# Patient Record
Sex: Male | Born: 1940 | Race: White | Hispanic: No | Marital: Married | Smoking: Former smoker
Health system: Southern US, Community
[De-identification: ages and names within clinical notes are randomized; demographics above are authoritative.]

## PROBLEM LIST (undated history)

## (undated) DIAGNOSIS — N183 Chronic kidney disease, stage 3 unspecified: Secondary | ICD-10-CM

## (undated) DIAGNOSIS — I5020 Unspecified systolic (congestive) heart failure: Secondary | ICD-10-CM

## (undated) DIAGNOSIS — E039 Hypothyroidism, unspecified: Secondary | ICD-10-CM

## (undated) DIAGNOSIS — I350 Nonrheumatic aortic (valve) stenosis: Secondary | ICD-10-CM

## (undated) DIAGNOSIS — E119 Type 2 diabetes mellitus without complications: Secondary | ICD-10-CM

## (undated) HISTORY — PX: AORTIC VALVE REPLACEMENT: SHX41

## (undated) HISTORY — PX: CHOLECYSTECTOMY: SHX55

## (undated) SURGERY — Surgical Case
Anesthesia: *Unknown

---

## 2020-04-04 DIAGNOSIS — I2699 Other pulmonary embolism without acute cor pulmonale: Secondary | ICD-10-CM

## 2020-04-04 HISTORY — DX: Other pulmonary embolism without acute cor pulmonale: I26.99

## 2021-09-15 ENCOUNTER — Encounter (HOSPITAL_COMMUNITY): Payer: Self-pay | Admitting: Neurology

## 2021-09-15 ENCOUNTER — Inpatient Hospital Stay (HOSPITAL_COMMUNITY): Payer: Medicare HMO

## 2021-09-15 ENCOUNTER — Inpatient Hospital Stay (HOSPITAL_COMMUNITY): Payer: Medicare HMO | Admitting: Certified Registered Nurse Anesthetist

## 2021-09-15 ENCOUNTER — Emergency Department (HOSPITAL_COMMUNITY): Payer: Medicare HMO

## 2021-09-15 ENCOUNTER — Encounter (HOSPITAL_COMMUNITY)
Admission: EM | Disposition: A | Discharge status: Rehabilitation Facility | Payer: Self-pay | Source: Home / Self Care | Attending: Neurology

## 2021-09-15 ENCOUNTER — Inpatient Hospital Stay (HOSPITAL_COMMUNITY)
Admission: EM | Admit: 2021-09-15 | Discharge: 2021-09-22 | DRG: 023 | Disposition: A | Discharge status: Rehabilitation Facility | Payer: Medicare HMO | Attending: Neurology | Admitting: Neurology

## 2021-09-15 DIAGNOSIS — I639 Cerebral infarction, unspecified: Principal | ICD-10-CM

## 2021-09-15 DIAGNOSIS — N183 Chronic kidney disease, stage 3 unspecified: Secondary | ICD-10-CM | POA: Diagnosis not present

## 2021-09-15 DIAGNOSIS — K5900 Constipation, unspecified: Secondary | ICD-10-CM | POA: Diagnosis not present

## 2021-09-15 DIAGNOSIS — R1312 Dysphagia, oropharyngeal phase: Secondary | ICD-10-CM | POA: Diagnosis not present

## 2021-09-15 DIAGNOSIS — Z79890 Hormone replacement therapy (postmenopausal): Secondary | ICD-10-CM

## 2021-09-15 DIAGNOSIS — K5903 Drug induced constipation: Secondary | ICD-10-CM | POA: Diagnosis not present

## 2021-09-15 DIAGNOSIS — Z86718 Personal history of other venous thrombosis and embolism: Secondary | ICD-10-CM

## 2021-09-15 DIAGNOSIS — K567 Ileus, unspecified: Secondary | ICD-10-CM | POA: Diagnosis not present

## 2021-09-15 DIAGNOSIS — Z9911 Dependence on respirator [ventilator] status: Secondary | ICD-10-CM | POA: Diagnosis not present

## 2021-09-15 DIAGNOSIS — E43 Unspecified severe protein-calorie malnutrition: Secondary | ICD-10-CM | POA: Insufficient documentation

## 2021-09-15 DIAGNOSIS — Z20822 Contact with and (suspected) exposure to covid-19: Secondary | ICD-10-CM | POA: Diagnosis present

## 2021-09-15 DIAGNOSIS — E1122 Type 2 diabetes mellitus with diabetic chronic kidney disease: Secondary | ICD-10-CM | POA: Diagnosis present

## 2021-09-15 DIAGNOSIS — J9601 Acute respiratory failure with hypoxia: Secondary | ICD-10-CM | POA: Diagnosis not present

## 2021-09-15 DIAGNOSIS — Z86711 Personal history of pulmonary embolism: Secondary | ICD-10-CM

## 2021-09-15 DIAGNOSIS — E039 Hypothyroidism, unspecified: Secondary | ICD-10-CM | POA: Diagnosis present

## 2021-09-15 DIAGNOSIS — I63412 Cerebral infarction due to embolism of left middle cerebral artery: Principal | ICD-10-CM | POA: Diagnosis present

## 2021-09-15 DIAGNOSIS — N179 Acute kidney failure, unspecified: Secondary | ICD-10-CM | POA: Diagnosis present

## 2021-09-15 DIAGNOSIS — R001 Bradycardia, unspecified: Secondary | ICD-10-CM | POA: Diagnosis present

## 2021-09-15 DIAGNOSIS — I6523 Occlusion and stenosis of bilateral carotid arteries: Secondary | ICD-10-CM | POA: Diagnosis present

## 2021-09-15 DIAGNOSIS — A419 Sepsis, unspecified organism: Secondary | ICD-10-CM | POA: Diagnosis not present

## 2021-09-15 DIAGNOSIS — Z9049 Acquired absence of other specified parts of digestive tract: Secondary | ICD-10-CM

## 2021-09-15 DIAGNOSIS — J690 Pneumonitis due to inhalation of food and vomit: Secondary | ICD-10-CM | POA: Diagnosis not present

## 2021-09-15 DIAGNOSIS — E1165 Type 2 diabetes mellitus with hyperglycemia: Secondary | ICD-10-CM | POA: Diagnosis present

## 2021-09-15 DIAGNOSIS — G4700 Insomnia, unspecified: Secondary | ICD-10-CM | POA: Diagnosis not present

## 2021-09-15 DIAGNOSIS — R4701 Aphasia: Secondary | ICD-10-CM | POA: Diagnosis present

## 2021-09-15 DIAGNOSIS — L8990 Pressure ulcer of unspecified site, unspecified stage: Secondary | ICD-10-CM | POA: Insufficient documentation

## 2021-09-15 DIAGNOSIS — G8191 Hemiplegia, unspecified affecting right dominant side: Secondary | ICD-10-CM | POA: Diagnosis present

## 2021-09-15 DIAGNOSIS — I878 Other specified disorders of veins: Secondary | ICD-10-CM | POA: Diagnosis present

## 2021-09-15 DIAGNOSIS — Z953 Presence of xenogenic heart valve: Secondary | ICD-10-CM | POA: Diagnosis not present

## 2021-09-15 DIAGNOSIS — I5022 Chronic systolic (congestive) heart failure: Secondary | ICD-10-CM | POA: Diagnosis present

## 2021-09-15 DIAGNOSIS — R471 Dysarthria and anarthria: Secondary | ICD-10-CM | POA: Diagnosis present

## 2021-09-15 DIAGNOSIS — N1831 Chronic kidney disease, stage 3a: Secondary | ICD-10-CM | POA: Diagnosis present

## 2021-09-15 DIAGNOSIS — I428 Other cardiomyopathies: Secondary | ICD-10-CM | POA: Diagnosis present

## 2021-09-15 DIAGNOSIS — E785 Hyperlipidemia, unspecified: Secondary | ICD-10-CM | POA: Diagnosis present

## 2021-09-15 DIAGNOSIS — E11649 Type 2 diabetes mellitus with hypoglycemia without coma: Secondary | ICD-10-CM | POA: Diagnosis present

## 2021-09-15 DIAGNOSIS — I130 Hypertensive heart and chronic kidney disease with heart failure and stage 1 through stage 4 chronic kidney disease, or unspecified chronic kidney disease: Secondary | ICD-10-CM | POA: Diagnosis present

## 2021-09-15 DIAGNOSIS — R32 Unspecified urinary incontinence: Secondary | ICD-10-CM | POA: Diagnosis present

## 2021-09-15 DIAGNOSIS — Z79899 Other long term (current) drug therapy: Secondary | ICD-10-CM

## 2021-09-15 DIAGNOSIS — Z880 Allergy status to penicillin: Secondary | ICD-10-CM

## 2021-09-15 DIAGNOSIS — R29810 Facial weakness: Secondary | ICD-10-CM | POA: Diagnosis present

## 2021-09-15 DIAGNOSIS — J90 Pleural effusion, not elsewhere classified: Secondary | ICD-10-CM | POA: Diagnosis not present

## 2021-09-15 DIAGNOSIS — D696 Thrombocytopenia, unspecified: Secondary | ICD-10-CM | POA: Diagnosis not present

## 2021-09-15 DIAGNOSIS — I4891 Unspecified atrial fibrillation: Secondary | ICD-10-CM

## 2021-09-15 DIAGNOSIS — I6389 Other cerebral infarction: Secondary | ICD-10-CM | POA: Diagnosis not present

## 2021-09-15 DIAGNOSIS — I63512 Cerebral infarction due to unspecified occlusion or stenosis of left middle cerebral artery: Secondary | ICD-10-CM | POA: Diagnosis not present

## 2021-09-15 DIAGNOSIS — I454 Nonspecific intraventricular block: Secondary | ICD-10-CM | POA: Diagnosis present

## 2021-09-15 DIAGNOSIS — I4811 Longstanding persistent atrial fibrillation: Secondary | ICD-10-CM | POA: Diagnosis present

## 2021-09-15 DIAGNOSIS — Z7901 Long term (current) use of anticoagulants: Secondary | ICD-10-CM | POA: Diagnosis not present

## 2021-09-15 DIAGNOSIS — R54 Age-related physical debility: Secondary | ICD-10-CM | POA: Diagnosis present

## 2021-09-15 DIAGNOSIS — I255 Ischemic cardiomyopathy: Secondary | ICD-10-CM | POA: Diagnosis not present

## 2021-09-15 DIAGNOSIS — R29724 NIHSS score 24: Secondary | ICD-10-CM | POA: Diagnosis present

## 2021-09-15 DIAGNOSIS — I69351 Hemiplegia and hemiparesis following cerebral infarction affecting right dominant side: Secondary | ICD-10-CM | POA: Diagnosis not present

## 2021-09-15 DIAGNOSIS — Z87891 Personal history of nicotine dependence: Secondary | ICD-10-CM

## 2021-09-15 DIAGNOSIS — R1310 Dysphagia, unspecified: Secondary | ICD-10-CM | POA: Diagnosis present

## 2021-09-15 DIAGNOSIS — I480 Paroxysmal atrial fibrillation: Secondary | ICD-10-CM

## 2021-09-15 DIAGNOSIS — Z6821 Body mass index (BMI) 21.0-21.9, adult: Secondary | ICD-10-CM

## 2021-09-15 DIAGNOSIS — R4702 Dysphasia: Secondary | ICD-10-CM | POA: Diagnosis present

## 2021-09-15 DIAGNOSIS — I447 Left bundle-branch block, unspecified: Secondary | ICD-10-CM | POA: Diagnosis present

## 2021-09-15 DIAGNOSIS — R6521 Severe sepsis with septic shock: Secondary | ICD-10-CM | POA: Diagnosis not present

## 2021-09-15 DIAGNOSIS — I672 Cerebral atherosclerosis: Secondary | ICD-10-CM | POA: Diagnosis present

## 2021-09-15 DIAGNOSIS — Z23 Encounter for immunization: Secondary | ICD-10-CM | POA: Diagnosis present

## 2021-09-15 HISTORY — DX: Chronic kidney disease, stage 3 unspecified: N18.30

## 2021-09-15 HISTORY — PX: IR CT HEAD LTD: IMG2386

## 2021-09-15 HISTORY — DX: Nonrheumatic aortic (valve) stenosis: I35.0

## 2021-09-15 HISTORY — PX: IR PERCUTANEOUS ART THROMBECTOMY/INFUSION INTRACRANIAL INC DIAG ANGIO: IMG6087

## 2021-09-15 HISTORY — DX: Type 2 diabetes mellitus without complications: E11.9

## 2021-09-15 HISTORY — DX: Hypothyroidism, unspecified: E03.9

## 2021-09-15 HISTORY — PX: IR US GUIDE VASC ACCESS RIGHT: IMG2390

## 2021-09-15 HISTORY — DX: Unspecified systolic (congestive) heart failure: I50.20

## 2021-09-15 HISTORY — PX: RADIOLOGY WITH ANESTHESIA: SHX6223

## 2021-09-15 LAB — I-STAT CHEM 8, ED
BUN: 18 mg/dL (ref 8–23)
Calcium, Ion: 0.89 mmol/L — CL (ref 1.15–1.40)
Chloride: 99 mmol/L (ref 98–111)
Creatinine, Ser: 1.6 mg/dL — ABNORMAL HIGH (ref 0.61–1.24)
Glucose, Bld: 104 mg/dL — ABNORMAL HIGH (ref 70–99)
HCT: 43 % (ref 39.0–52.0)
Hemoglobin: 14.6 g/dL (ref 13.0–17.0)
Potassium: 4.6 mmol/L (ref 3.5–5.1)
Sodium: 136 mmol/L (ref 135–145)
TCO2: 27 mmol/L (ref 22–32)

## 2021-09-15 LAB — POCT I-STAT 7, (LYTES, BLD GAS, ICA,H+H)
Acid-Base Excess: 5 mmol/L — ABNORMAL HIGH (ref 0.0–2.0)
Bicarbonate: 28.1 mmol/L — ABNORMAL HIGH (ref 20.0–28.0)
Calcium, Ion: 1.07 mmol/L — ABNORMAL LOW (ref 1.15–1.40)
HCT: 32 % — ABNORMAL LOW (ref 39.0–52.0)
Hemoglobin: 10.9 g/dL — ABNORMAL LOW (ref 13.0–17.0)
O2 Saturation: 100 %
Patient temperature: 97.7
Potassium: 4.1 mmol/L (ref 3.5–5.1)
Sodium: 134 mmol/L — ABNORMAL LOW (ref 135–145)
TCO2: 29 mmol/L (ref 22–32)
pCO2 arterial: 35.6 mmHg (ref 32–48)
pH, Arterial: 7.503 — ABNORMAL HIGH (ref 7.35–7.45)
pO2, Arterial: 293 mmHg — ABNORMAL HIGH (ref 83–108)

## 2021-09-15 LAB — CBC
HCT: 43.2 % (ref 39.0–52.0)
Hemoglobin: 14.5 g/dL (ref 13.0–17.0)
MCH: 33.1 pg (ref 26.0–34.0)
MCHC: 33.6 g/dL (ref 30.0–36.0)
MCV: 98.6 fL (ref 80.0–100.0)
Platelets: 109 10*3/uL — ABNORMAL LOW (ref 150–400)
RBC: 4.38 MIL/uL (ref 4.22–5.81)
RDW: 13.2 % (ref 11.5–15.5)
WBC: 7.8 10*3/uL (ref 4.0–10.5)
nRBC: 0 % (ref 0.0–0.2)

## 2021-09-15 LAB — HEMOGLOBIN A1C
Hgb A1c MFr Bld: 5.9 % — ABNORMAL HIGH (ref 4.8–5.6)
Mean Plasma Glucose: 122.63 mg/dL

## 2021-09-15 LAB — PROTIME-INR
INR: 1.1 (ref 0.8–1.2)
Prothrombin Time: 14.2 seconds (ref 11.4–15.2)

## 2021-09-15 LAB — COMPREHENSIVE METABOLIC PANEL
ALT: 28 U/L (ref 0–44)
AST: 34 U/L (ref 15–41)
Albumin: 3.2 g/dL — ABNORMAL LOW (ref 3.5–5.0)
Alkaline Phosphatase: 94 U/L (ref 38–126)
Anion gap: 9 (ref 5–15)
BUN: 15 mg/dL (ref 8–23)
CO2: 27 mmol/L (ref 22–32)
Calcium: 8.6 mg/dL — ABNORMAL LOW (ref 8.9–10.3)
Chloride: 101 mmol/L (ref 98–111)
Creatinine, Ser: 1.54 mg/dL — ABNORMAL HIGH (ref 0.61–1.24)
GFR, Estimated: 45 mL/min — ABNORMAL LOW (ref >60)
Glucose, Bld: 113 mg/dL — ABNORMAL HIGH (ref 70–99)
Potassium: 4.8 mmol/L (ref 3.5–5.1)
Sodium: 137 mmol/L (ref 135–145)
Total Bilirubin: 0.8 mg/dL (ref 0.3–1.2)
Total Protein: 5.9 g/dL — ABNORMAL LOW (ref 6.5–8.1)

## 2021-09-15 LAB — GLUCOSE, CAPILLARY
Glucose-Capillary: 147 mg/dL — ABNORMAL HIGH (ref 70–99)
Glucose-Capillary: 178 mg/dL — ABNORMAL HIGH (ref 70–99)
Glucose-Capillary: 228 mg/dL — ABNORMAL HIGH (ref 70–99)

## 2021-09-15 LAB — DIFFERENTIAL
Abs Immature Granulocytes: 0.01 10*3/uL (ref 0.00–0.07)
Basophils Absolute: 0 10*3/uL (ref 0.0–0.1)
Basophils Relative: 1 %
Eosinophils Absolute: 0.1 10*3/uL (ref 0.0–0.5)
Eosinophils Relative: 2 %
Immature Granulocytes: 0 %
Lymphocytes Relative: 34 %
Lymphs Abs: 2.6 10*3/uL (ref 0.7–4.0)
Monocytes Absolute: 0.8 10*3/uL (ref 0.1–1.0)
Monocytes Relative: 10 %
Neutro Abs: 4.2 10*3/uL (ref 1.7–7.7)
Neutrophils Relative %: 53 %

## 2021-09-15 LAB — PHOSPHORUS: Phosphorus: 2.2 mg/dL — ABNORMAL LOW (ref 2.5–4.6)

## 2021-09-15 LAB — TYPE AND SCREEN
ABO/RH(D): O POS
Antibody Screen: NEGATIVE

## 2021-09-15 LAB — MAGNESIUM: Magnesium: 1.9 mg/dL (ref 1.7–2.4)

## 2021-09-15 LAB — CBG MONITORING, ED: Glucose-Capillary: 122 mg/dL — ABNORMAL HIGH (ref 70–99)

## 2021-09-15 LAB — SARS CORONAVIRUS 2 BY RT PCR: SARS Coronavirus 2 by RT PCR: NEGATIVE

## 2021-09-15 LAB — ETHANOL: Alcohol, Ethyl (B): <10 mg/dL (ref <10)

## 2021-09-15 LAB — ABO/RH: ABO/RH(D): O POS

## 2021-09-15 LAB — APTT: aPTT: 31 seconds (ref 24–36)

## 2021-09-15 LAB — TROPONIN I (HIGH SENSITIVITY): Troponin I (High Sensitivity): 33 ng/L — ABNORMAL HIGH (ref <18)

## 2021-09-15 SURGERY — IR WITH ANESTHESIA
Anesthesia: General

## 2021-09-15 MED ORDER — PROPOFOL 500 MG/50ML IV EMUL: INTRAVENOUS | Status: DC | PRN

## 2021-09-15 MED ORDER — FENTANYL CITRATE PF 50 MCG/ML IJ SOSY: 50.0000 ug | PREFILLED_SYRINGE | INTRAMUSCULAR | Status: DC | PRN

## 2021-09-15 MED ORDER — CHLORHEXIDINE GLUCONATE CLOTH 2 % EX PADS
6.0000 | MEDICATED_PAD | Freq: Every day | CUTANEOUS | Status: DC
Start: 2021-09-16 — End: 2021-09-17
  Administered 2021-09-16: 6 via TOPICAL

## 2021-09-15 MED ORDER — CALCIUM GLUCONATE-NACL 1-0.675 GM/50ML-% IV SOLN
1.0000 g | Freq: Once | INTRAVENOUS | Status: AC
  Administered 2021-09-15: 1000 mg via INTRAVENOUS
  Filled 2021-09-15: qty 50

## 2021-09-15 MED ORDER — SODIUM CHLORIDE 0.9 % IV SOLN: INTRAVENOUS | Status: DC | PRN

## 2021-09-15 MED ORDER — INSULIN ASPART 100 UNIT/ML IJ SOLN
1.0000 [IU] | INTRAMUSCULAR | Status: DC
  Administered 2021-09-15: 3 [IU] via SUBCUTANEOUS
  Administered 2021-09-15: 2 [IU] via SUBCUTANEOUS
  Administered 2021-09-16: 3 [IU] via SUBCUTANEOUS
  Administered 2021-09-16: 2 [IU] via SUBCUTANEOUS

## 2021-09-15 MED ORDER — PANTOPRAZOLE SODIUM 40 MG IV SOLR
40.0000 mg | Freq: Every day | INTRAVENOUS | Status: DC
  Administered 2021-09-15 – 2021-09-17 (×3): 40 mg via INTRAVENOUS
  Filled 2021-09-15 (×3): qty 10

## 2021-09-15 MED ORDER — ONDANSETRON HCL 4 MG/2ML IJ SOLN
INTRAMUSCULAR | Status: DC | PRN
  Administered 2021-09-15: 4 mg via INTRAVENOUS

## 2021-09-15 MED ORDER — LIDOCAINE 2% (20 MG/ML) 5 ML SYRINGE
INTRAMUSCULAR | Status: DC | PRN
  Administered 2021-09-15: 40 mg via INTRAVENOUS

## 2021-09-15 MED ORDER — ORAL CARE MOUTH RINSE
15.0000 mL | OROMUCOSAL | Status: DC
  Administered 2021-09-15 – 2021-09-18 (×34): 15 mL via OROMUCOSAL

## 2021-09-15 MED ORDER — PROPOFOL 10 MG/ML IV BOLUS
INTRAVENOUS | Status: DC | PRN
  Administered 2021-09-15: 75 ug/kg/min via INTRAVENOUS
  Administered 2021-09-15: 80 mg via INTRAVENOUS

## 2021-09-15 MED ORDER — EPTIFIBATIDE 20 MG/10ML IV SOLN
INTRAVENOUS | Status: AC
  Filled 2021-09-15: qty 10

## 2021-09-15 MED ORDER — STROKE: EARLY STAGES OF RECOVERY BOOK
Freq: Once | Status: AC
  Filled 2021-09-15: qty 1

## 2021-09-15 MED ORDER — SUCCINYLCHOLINE CHLORIDE 200 MG/10ML IV SOSY
PREFILLED_SYRINGE | INTRAVENOUS | Status: DC | PRN
  Administered 2021-09-15: 140 mg via INTRAVENOUS

## 2021-09-15 MED ORDER — POLYETHYLENE GLYCOL 3350 17 G PO PACK
17.0000 g | PACK | Freq: Every day | ORAL | Status: DC
  Filled 2021-09-15: qty 1

## 2021-09-15 MED ORDER — DOCUSATE SODIUM 50 MG/5ML PO LIQD
100.0000 mg | Freq: Two times a day (BID) | ORAL | Status: DC
  Administered 2021-09-15: 100 mg
  Filled 2021-09-15 (×2): qty 10

## 2021-09-15 MED ORDER — FENTANYL CITRATE (PF) 100 MCG/2ML IJ SOLN
INTRAMUSCULAR | Status: DC | PRN
  Administered 2021-09-15: 100 ug via INTRAVENOUS

## 2021-09-15 MED ORDER — FENTANYL CITRATE (PF) 100 MCG/2ML IJ SOLN
INTRAMUSCULAR | Status: AC
  Filled 2021-09-15: qty 2

## 2021-09-15 MED ORDER — ROCURONIUM BROMIDE 10 MG/ML (PF) SYRINGE
PREFILLED_SYRINGE | INTRAVENOUS | Status: DC | PRN
  Administered 2021-09-15: 40 mg via INTRAVENOUS

## 2021-09-15 MED ORDER — ASPIRIN 81 MG PO CHEW: 81.0000 mg | CHEWABLE_TABLET | Freq: Every day | ORAL | Status: DC

## 2021-09-15 MED ORDER — SENNOSIDES-DOCUSATE SODIUM 8.6-50 MG PO TABS
1.0000 | ORAL_TABLET | Freq: Two times a day (BID) | ORAL | Status: DC
  Administered 2021-09-15: 1 via ORAL
  Filled 2021-09-15 (×2): qty 1

## 2021-09-15 MED ORDER — FENTANYL CITRATE (PF) 100 MCG/2ML IJ SOLN
50.0000 ug | INTRAMUSCULAR | Status: DC | PRN
  Administered 2021-09-15 – 2021-09-16 (×2): 100 ug via INTRAVENOUS
  Filled 2021-09-15 (×2): qty 2

## 2021-09-15 MED ORDER — SODIUM CHLORIDE 0.9% FLUSH
3.0000 mL | Freq: Once | INTRAVENOUS | Status: AC
  Administered 2021-09-19: 3 mL via INTRAVENOUS

## 2021-09-15 MED ORDER — SODIUM CHLORIDE 0.9 % IV SOLN: INTRAVENOUS | Status: DC

## 2021-09-15 MED ORDER — ASPIRIN 81 MG PO CHEW
CHEWABLE_TABLET | ORAL | Status: AC
  Filled 2021-09-15: qty 1

## 2021-09-15 MED ORDER — ATORVASTATIN CALCIUM 40 MG PO TABS
40.0000 mg | ORAL_TABLET | Freq: Every day | ORAL | Status: DC
  Administered 2021-09-17 – 2021-09-18 (×2): 40 mg
  Filled 2021-09-15 (×2): qty 1

## 2021-09-15 MED ORDER — TICAGRELOR 90 MG PO TABS
ORAL_TABLET | ORAL | Status: AC
  Filled 2021-09-15: qty 2

## 2021-09-15 MED ORDER — PROPOFOL 1000 MG/100ML IV EMUL
5.0000 ug/kg/min | INTRAVENOUS | Status: DC
  Administered 2021-09-16: 30 ug/kg/min via INTRAVENOUS
  Administered 2021-09-16 – 2021-09-17 (×2): 20 ug/kg/min via INTRAVENOUS
  Filled 2021-09-15 (×5): qty 100

## 2021-09-15 MED ORDER — IOHEXOL 300 MG/ML  SOLN
100.0000 mL | Freq: Once | INTRAMUSCULAR | Status: AC | PRN
  Administered 2021-09-15: 25 mL via INTRA_ARTERIAL

## 2021-09-15 MED ORDER — GLYCOPYRROLATE PF 0.2 MG/ML IJ SOSY
PREFILLED_SYRINGE | INTRAMUSCULAR | Status: DC | PRN
  Administered 2021-09-15: .2 mg via INTRAVENOUS

## 2021-09-15 MED ORDER — FENTANYL CITRATE (PF) 100 MCG/2ML IJ SOLN: 50.0000 ug | INTRAMUSCULAR | Status: DC | PRN

## 2021-09-15 MED ORDER — IOHEXOL 350 MG/ML SOLN
75.0000 mL | Freq: Once | INTRAVENOUS | Status: AC | PRN
Start: 2021-09-15 — End: 2021-09-15
  Administered 2021-09-15: 75 mL via INTRAVENOUS

## 2021-09-15 MED ORDER — CLEVIDIPINE BUTYRATE 0.5 MG/ML IV EMUL: 0.0000 mg/h | INTRAVENOUS | Status: DC

## 2021-09-15 MED ORDER — ACETAMINOPHEN 325 MG PO TABS: 650.0000 mg | ORAL_TABLET | ORAL | Status: DC | PRN

## 2021-09-15 MED ORDER — CANGRELOR TETRASODIUM 50 MG IV SOLR
INTRAVENOUS | Status: AC
  Filled 2021-09-15: qty 50

## 2021-09-15 MED ORDER — EPHEDRINE SULFATE-NACL 50-0.9 MG/10ML-% IV SOSY
PREFILLED_SYRINGE | INTRAVENOUS | Status: DC | PRN
  Administered 2021-09-15 (×4): 5 mg via INTRAVENOUS

## 2021-09-15 MED ORDER — CLEVIDIPINE BUTYRATE 0.5 MG/ML IV EMUL
INTRAVENOUS | Status: AC
  Administered 2021-09-15: 8 mg/h via INTRAVENOUS
  Filled 2021-09-15: qty 50

## 2021-09-15 MED ORDER — CLOPIDOGREL BISULFATE 300 MG PO TABS
ORAL_TABLET | ORAL | Status: AC
  Filled 2021-09-15: qty 1

## 2021-09-15 MED ORDER — DEXAMETHASONE SODIUM PHOSPHATE 10 MG/ML IJ SOLN
INTRAMUSCULAR | Status: DC | PRN
  Administered 2021-09-15: 5 mg via INTRAVENOUS

## 2021-09-15 MED ORDER — ORAL CARE MOUTH RINSE: 15.0000 mL | OROMUCOSAL | Status: DC | PRN

## 2021-09-15 MED ORDER — NITROGLYCERIN 1 MG/10 ML FOR IR/CATH LAB
INTRA_ARTERIAL | Status: AC
  Filled 2021-09-15: qty 10

## 2021-09-15 MED ORDER — LACTATED RINGERS IV SOLN: INTRAVENOUS | Status: DC | PRN

## 2021-09-15 MED ORDER — VERAPAMIL HCL 2.5 MG/ML IV SOLN
INTRAVENOUS | Status: AC
  Filled 2021-09-15: qty 2

## 2021-09-15 MED ORDER — TIROFIBAN HCL IN NACL 5-0.9 MG/100ML-% IV SOLN
INTRAVENOUS | Status: AC
  Filled 2021-09-15: qty 100

## 2021-09-15 MED ORDER — CLEVIDIPINE BUTYRATE 0.5 MG/ML IV EMUL
0.0000 mg/h | INTRAVENOUS | Status: DC
  Administered 2021-09-15: 3 mg/h via INTRAVENOUS
  Administered 2021-09-16: 4 mg/h via INTRAVENOUS
  Filled 2021-09-15 (×3): qty 100

## 2021-09-15 MED ORDER — ACETAMINOPHEN 650 MG RE SUPP: 650.0000 mg | RECTAL | Status: DC | PRN

## 2021-09-15 MED ORDER — PROPOFOL 500 MG/50ML IV EMUL
INTRAVENOUS | Status: DC | PRN
  Administered 2021-09-15: 75 ug/kg/min via INTRAVENOUS

## 2021-09-15 MED ORDER — ACETAMINOPHEN 160 MG/5ML PO SOLN: 650.0000 mg | ORAL | Status: DC | PRN

## 2021-09-15 NOTE — Anesthesia Procedure Notes (Signed)
Arterial Line Insertion Start/End6/14/2023 4:00 PM, 09/15/2021 4:09 PM Performed by: Imagene Riches, CRNA, CRNA  Patient location: OR. Preanesthetic checklist: patient identified, IV checked, site marked, risks and benefits discussed, surgical consent, monitors and equipment checked, pre-op evaluation, timeout performed and anesthesia consent Left, radial was placed Catheter size: 20 G Hand hygiene performed  and maximum sterile barriers used  Allen's test indicative of satisfactory collateral circulation Attempts: 1 Procedure performed without using ultrasound guided technique. Following insertion, dressing applied and Biopatch. Post procedure assessment: normal  Patient tolerated the procedure well with no immediate complications.

## 2021-09-15 NOTE — Consult Note (Addendum)
NAME:  Eugene Campbell, MRN:  [id number], DOB:  [birth year], LOS: 0 ADMISSION DATE:  09/15/2021, CONSULTATION DATE:  09/15/21 REFERRING MD:  Theda Sers CHIEF COMPLAINT:  Vent management   History of Present Illness:  Eugene Campbell is a 81 y.o. male who per [place] has PMH including but not limited to A.fib on Eliquis and Amiodarone, HTN, PE and DVT, clotting disorder (unknown what exactly), DM2 without long term insulin use, Hypothyroidism. He presented to [facility] 6/14 with stroke like symptoms (dysphasia, right hemiplegia) that began suddenly that afternoon after he had been working outside prior. LKN 1430.  CTH demonstrated hyperdense L M1 MCA segment with suspected subtle acute infarct involving left insula. CTA head demonstrated occluded M1 segment of L MCA.  He was taken to IR where he had cerebral angiogram with mechanical thrombectomy with complete recanalization of the L MCA (TICI 3).  Post procedure, he remained ventilated and PCCM was called for assistance with vent management.   Pertinent  Medical History:  has Stroke [facility]) on their problem list.  Significant Hospital Events: Including procedures, antibiotic start and stop dates in addition to other pertinent events   6/14 admit.  Interim History / Subjective:  HR 40's - 50's, irregular, A.fib. Sedated on Propofol.  Objective:  Blood pressure (!) 182/124, pulse (!) 56, resp. rate (!) 22, weight 75.2 kg, SpO2 99 %.        Intake/Output Summary (Last 24 hours) at 09/15/2021 1718 Last data filed at 09/15/2021 1658 Gross per 24 hour  Intake 1200 ml  Output 575 ml  Net 625 ml   Filed Weights   09/15/21 1527  Weight: 75.2 kg    Examination: General: Elderly male, resting in bed, in NAD. Neuro: Sedated, not responsive. HEENT: [place]/AT. Sclerae anicteric. ETT in place. Cardiovascular: IRIR, brady, soft SEM.  Lungs: Respirations even and unlabored.  CTA bilaterally, No W/R/R. Abdomen: BS x 4, soft, NT/ND.   Musculoskeletal: No gross deformities, no edema.  Skin: Intact, warm, no rashes.  Labs/imaging personally reviewed:  [facility] 6/14 > hyperdense L M1 MCA segment with suspected subtle acute infarct involving left insula.  CTA head 6/14 > occluded M1 segment of L MCA.CTH demonstrated hyperdense L M1 MCA segment with suspected subtle acute infarct involving left insula. CTA head demonstrated occluded M1 segment of L MCA. 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 - Post procedure management per IR. - Stroke workup / management per neuro. - F/u on MRI brain, echo.  Hypertension. - Cleviprex PRN for goal SBP 120 - 140 per neuro. - Needs pharmacy assistance for med rec as care everywhere shows discrepancies in meds versus some of cardiology notes etc.  Respiratory insufficiency - due to inability to protect the airway in the setting of above. - Full vent support. - Assess ABG. - Wean as able. - Daily SBT if mental status allows. - Bronchial hygiene. - Follow CXR.  AKI. Hypocalcemia. - Supportive care with gentle fluids. - 1g Ca gluconate. - Follow BMP.  Bradycardia - ? 2/2 high dose propofol, now lowered and RN to adjust accordingly. Hx A.fib on Eliquis and Amiodarone, HTN, sCHF and NICM (Echo from Oct 2022 with EF 20-25% - Monitor closely. - Hold home Amiodarone. - Defer to neuro regarding timing of resuming AC, will need heparin gtt for now  then transition back to home Eliquis eventually.  Hx PE and DVT (Oct 2022). - Supportive care.  Hx Hypothyroidism. - Check TSH. - Continue home Synthroid (75 daily).    Best practice (evaluated daily):  Per primary team.  Labs   CBC: Recent Labs  Lab 09/15/21 1525 09/15/21 1532  WBC 7.8  --   NEUTROABS 4.2  --   HGB 14.5 14.6  HCT 43.2 43.0  MCV 98.6  --   PLT 109*  --      Basic Metabolic Panel: Recent Labs  Lab 09/15/21 1525 09/15/21 1532  NA 137 136  K 4.8 4.6  CL 101 99  CO2 27  --   GLUCOSE 113* 104*  BUN 15 18  CREATININE 1.54* 1.60*  CALCIUM 8.6*  --    GFR: CrCl cannot be calculated (Unknown ideal weight.). Recent Labs  Lab 09/15/21 1525  WBC 7.8    Liver Function Tests: Recent Labs  Lab 09/15/21 1525  AST 34  ALT 28  ALKPHOS 94  BILITOT 0.8  PROT 5.9*  ALBUMIN 3.2*   No results for input(s): "LIPASE", "AMYLASE" in the last 168 hours. No results for input(s): "AMMONIA" in the last 168 hours.  ABG    Component Value Date/Time   TCO2 27 09/15/2021 1532     Coagulation Profile: Recent Labs  Lab 09/15/21 1525  INR 1.1    Cardiac Enzymes: No results for input(s): "CKTOTAL", "CKMB", "CKMBINDEX", "TROPONINI" in the last 168 hours.  HbA1C: No results found for: "HGBA1C"  CBG: Recent Labs  Lab 09/15/21 1524  GLUCAP 122*    Review of Systems:   Unable to obtain as pt is encephalopathic.  Past Medical History:  He,  has no past medical history on file.   Surgical History:  Unavailable.  Social History:      Family History:  His family history is not on file.   Allergies Not on File   Home Medications  Prior to Admission medications   Not on File     Critical care time: 45 min.   Montey Hora, [facility] For pager details, please see AMION or use Epic chat  After 1900, please call [facility] for cross coverage needs 09/15/2021, 5:18 PM

## 2021-09-15 NOTE — H&P (Signed)
Neurology Consult H&P  Eugene Campbell MR# [id number] 09/15/2021  CC: code stroke  History is obtained from: EMS and chart.  HPI: Eugene Campbell is a 81 y.o. male PMHx as reviewed below was outside working, came inside and sat down when his wife noticed that he was not speaking or responding normally to her, so she called EMS.    EMS the activated code stroke due to right arm and leg weakness, right sided facial droop, and aphasia.   LKW: 1430 tNK given: No not a candidate due to apixaban IR Thrombectomy yes Modified Rankin Scale: 1-No significant post stroke disability and can perform usual duties with stroke symptoms NIHSS: 24  ROS: A complete ROS was performed and is negative except as noted in the HPI.   Past Medical History:  Diagnosis Date   Aortic stenosis    Chronic renal impairment, stage 3 (moderate), unspecified whether stage 3a or 3b CKD ([name])    Diabetes ([name])    HFrEF (heart failure with reduced ejection fraction) ([place])    Hypothyroidism    PE (pulmonary thromboembolism) ([place]) 2022    History reviewed. No pertinent family history.  Social History:  reports that he quit smoking about 50 years ago. His smoking use included cigarettes. He has a 13.00 pack-year smoking history. He does not have any smokeless tobacco history on file. He reports that he does not drink alcohol. No history on file for drug use.   Prior to Admission medications   Not on File    Exam: Current vital signs: BP (!) 146/75   Pulse (!) 44   Resp 16   Ht 6\' 1"  (1.854 m)   Wt 75.2 kg   SpO2 100%   BMI 21.87 kg/m   Physical Exam  Constitutional: Appears well-developed and well-nourished.  Psych: Unable to assess due to mutism Eyes: No scleral injection HENT: No OP obstruction. Head: Normocephalic.  Cardiovascular: Normal rate and regular rhythm.  Respiratory: Effort normal, symmetric excursions bilaterally, no audible wheezing. GI: Soft.  No distension. There is no tenderness.   Skin: WDI  Neuro: Mental Status: Patient is awake and mute Speech mute No signs of neglect. Right hemianopia. Pupils are equal, round, and reactive to light. Left gaze without ptosis or diplopia.  Facial sensation is symmetric to temperature Facial movement is symmetric.  Hearing is intact to voice. Uvula midline and palate elevates symmetrically. Shoulder shrug is symmetric. Tongue is midline without atrophy or fasciculations.  Tone is normal. Bulk is normal. right facial droop, right arm weakness, right leg weakness. Sensation is symmetric to light touch and temperature in the arms and legs. Deep Tendon Reflexes: 2+ and symmetric in the biceps and patellae. Toes are downgoing bilaterally. FNF and HKS are intact bilaterally. Gait - Deferred  I have reviewed labs in epic and the pertinent results are: CBG 122  I have reviewed the images obtained: NCT head showed subtle acute infarct involving the left insula with hyperdense M1 left MCA segment, highly suspicious for presence of endoluminal thrombus. ASPECTS 9. CTA head and neck showed M1 segment of the left middle cerebral artery is occluded at its origin. There is some reconstitution of enhancement within M2 and more distal left MCA vessels, likely due to collateral flow.  Assessment: Eugene Campbell is a 81 y.o. male PMHx as noted above with left M1 occlusion. The patient was not a candidate for tNK due to being on apixaban however, discussed with NeuroIR and he is a candidate for intervention and was  taken for procedure.  Plan: Admit to NICU for post thrombectomy management per protocol. Close surveillance of neurological status. Hip straight x 6 hrs Monitor groin puncture site for pulse quality, limb temperature, signs of neurovascular compromise.  Antiplatelets: Per neuro IR  Blood pressure: MAP >65 SBP 120-140: - Start clevidipine infusion may increase to maximum 32mcg/min as needed to maintain SBP<120 -140. -  Labetalol 20mg  every 10 minutes as needed if SBP>140.   Labs: type and cross, CBC, CMP, Mg, phos, troponin, HbA1c, lipid panel, TSH.  Maintain O2 sats > 94% Normothermia - For temperature >37.5C - acetaminophen 650mg  q4-6 hours PRN. Gentle IV hydration. CT head 6 hours post procedure or per protocol.  MRI brain without contrast when able. Relative euglycemia and treat if hyperglycemia (>200 mg/dL)/hypoglycemia (< 60mg /dL). TTE. Statin if LDL > 70 Telemetry monitoring for arrhythmia. Recommend Stroke education. Recommend PT/OT/SLP consult. If there is acute neurologic decline STAT CT head.   PPx:     GI - H2, docusate 400mg  qhs.     DVT - heparin subq and SCDs. Precautions: Aspiration/seizure/fall   This patient is critically ill and at significant risk of neurological worsening, death and care requires constant monitoring of vital signs, hemodynamics,respiratory and cardiac monitoring, neurological assessment, discussion with family, other specialists and medical decision making of high complexity. I spent 97 minutes of neurocritical care time  in the care of  this patient. This was time spent independent of any time provided by nurse practitioner or PA.  Electronically signed by:  Lynnae Sandhoff, MD Page: [phone] 09/15/2021, 6:20 PM  If 7pm- 7am, please page neurology on call as listed in [place].

## 2021-09-15 NOTE — Anesthesia Postprocedure Evaluation (Signed)
Anesthesia Post Note  Patient: Eugene Campbell  Procedure(s) Performed: IR WITH ANESTHESIA     Patient location during evaluation: SICU Anesthesia Type: General Level of consciousness: sedated and patient remains intubated per anesthesia plan Pain management: pain level controlled Vital Signs Assessment: post-procedure vital signs reviewed and stable Respiratory status: patient remains intubated per anesthesia plan and patient on ventilator - see flowsheet for VS Cardiovascular status: blood pressure returned to baseline and stable Postop Assessment: no apparent nausea or vomiting Anesthetic complications: no Comments: Pt remains intubated due to impaired mental status pre-operatively. 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  Last Vitals:  Vitals:   09/15/21 1745 09/15/21 1800  BP:  (!) 146/75  Pulse: (!) 45 (!) 44  Resp: 16 16  SpO2: 100% 100%    Last Pain: There were no vitals filed for this visit.               Midge Minium

## 2021-09-15 NOTE — ED Triage Notes (Signed)
Pt BIB [facility]  from home for eval of stroke like sx. Pt was reportedly working outside, came inside and per wife was normal. Pt then sat down in chair and wife noted that he was gazing off, not speaking and not responding normally to her. LKN per EMS is 1430 which is shortly after pt came inside. Per EMS, pt was initially NORMAL upon returning back into home. Pt arrives to ED w/ R sided gaze preference R arm flaccidity, R leg weakness, global aphasia, R sided facial droop.

## 2021-09-15 NOTE — Progress Notes (Signed)
Patient arrived on the unit on Propofol and Cleviprex. NIHSS, VS, pulse and vascular checks done. Patient stable and report given to night shift, RN

## 2021-09-15 NOTE — Progress Notes (Signed)
   09/15/21 1838  Adult Ventilator Settings  Vent Type Servo i  Humidity HME  Set Rate (S)  14 bmp  Adult Ventilator Measurements  SpO2 100 %   Vent changes made per Dr Chestine Spore.

## 2021-09-15 NOTE — Code Documentation (Addendum)
Stroke Response Nurse Documentation Code Documentation  Eugene Campbell is a 81 y.o. male arriving to [facility]  via [facility] on 09/15/2021. On Eliquis (apixaban) daily. Code stroke was activated by EMS.   Patient from home where he was LKW at 1430 and now complaining of inability to speak and right sided weakness. Per EMS, Wife reports that patient was outside. He came in from inside and was at his baseline. He sat in his chair and then wife reported that he stopped talking and was not acting normal. EMS called and activated a Code Stroke.   Stroke team at the bedside on patient arrival. Labs drawn and patient cleared for CT by Dr. Particia Nearing. Patient to CT with team. NIHSS 24, see documentation for details and code stroke times. Patient with disoriented, not following commands, left gaze preference , right hemianopia, right facial droop, right arm weakness, right leg weakness, Global aphasia , and dysarthria  on exam. The following imaging was completed:  CT Head and CTA. Patient is a candidate for IV Thrombolytic due to being on Eliquis. Patient is a candidate for IR due to having MCA M1 occlusion on imaging per MD Collins.   Care Plan: Take patient to IR.   Attempted to get ahold of family with no answer. Proceeded with two physician emergency consent.   Bedside handoff with IR RN Phineas Semen.    Lucila Maine  Stroke Response RN

## 2021-09-15 NOTE — Transfer of Care (Signed)
Immediate Anesthesia Transfer of Care Note  Patient: Eugene Campbell  Procedure(s) Performed: IR WITH ANESTHESIA  Patient Location: ICU  Anesthesia Type:General  Level of Consciousness: Patient remains intubated per anesthesia plan  Airway & Oxygen Therapy: Patient remains intubated per anesthesia plan and Patient placed on Ventilator (see vital sign flow sheet for setting)  Post-op Assessment: Report given to RN and Post -op Vital signs reviewed and stable  Post vital signs: Reviewed and stable  Last Vitals:  Vitals Value Taken Time  BP 152/63 09/15/21 1728  Temp    Pulse 47 09/15/21 1728  Resp 16 09/15/21 1728  SpO2 100 09/15/21 1728    Last Pain: There were no vitals filed for this visit.       Complications: No notable events documented.

## 2021-09-15 NOTE — Sedation Documentation (Signed)
PT transported to 2H24 with this RN, CRNA and 1 other RN. Hand off of care and report given to Wynona Canes, RN at bedside. Bilateral DP/PT pulses noted and right femoral groin site assessed, no bleeding or hematoma present.  Right foot noted mild purple discoloration (unknown if it is acute or chronic), however, pulses present. Right thigh and leg appropriate color for ethnicity and warm to the touch. Right foot noted to be warm as well. Dr. Nuala Alpha at bedside and assessed and gave no further orders. Wynona Canes, RN at bedside and aware.

## 2021-09-15 NOTE — Anesthesia Preprocedure Evaluation (Signed)
Anesthesia Evaluation  Patient identified by MRN, date of birth, ID band Patient confused    Reviewed: Unable to perform ROS - Chart review onlyPreop documentation limited or incomplete due to emergent nature of procedure.  Airway Mallampati: II  TM Distance: >3 FB     Dental  (+) Edentulous Upper, Edentulous Lower   Pulmonary    breath sounds clear to auscultation       Cardiovascular + dysrhythmias Atrial Fibrillation  Rhythm:Irregular Rate:Bradycardia  LBBB and Afib   Neuro/Psych Code Stroke: R sided gaze preference R arm flaccidity, R leg weakness, global aphasia, R sided facial droop. CVA (acute), Residual Symptoms    GI/Hepatic   Endo/Other    Renal/GU      Musculoskeletal   Abdominal   Peds  Hematology eliquis   Anesthesia Other Findings   Reproductive/Obstetrics                             Anesthesia Physical Anesthesia Plan  ASA: 4 and emergent  Anesthesia Plan: General   Post-op Pain Management: Minimal or no pain anticipated   Induction: Intravenous  PONV Risk Score and Plan: 2 and Ondansetron and Treatment may vary due to age or medical condition  Airway Management Planned: Oral ETT  Additional Equipment: Arterial line  Intra-op Plan:   Post-operative Plan: Possible Post-op intubation/ventilation  Informed Consent:     Only emergency history available  Plan Discussed with: CRNA and Surgeon  Anesthesia Plan Comments: (Plan routine monitors, GETA with Arterial pressure monitoring)        Anesthesia Quick Evaluation

## 2021-09-15 NOTE — Anesthesia Procedure Notes (Signed)
Procedure Name: Intubation Date/Time: 09/15/2021 4:01 PM  Performed by: Reece Agar, CRNAPre-anesthesia Checklist: Patient identified, Emergency Drugs available, Suction available and Patient being monitored Patient Re-evaluated:Patient Re-evaluated prior to induction Oxygen Delivery Method: Circle System Utilized Preoxygenation: Pre-oxygenation with 100% oxygen Induction Type: IV induction, Rapid sequence and Cricoid Pressure applied Ventilation: Mask ventilation without difficulty Laryngoscope Size: Mac and 4 Grade View: Grade II Tube type: Oral Tube size: 8.0 mm Number of attempts: 1 Airway Equipment and Method: Stylet Placement Confirmation: ETT inserted through vocal cords under direct vision, positive ETCO2 and breath sounds checked- equal and bilateral Secured at: 23 cm Tube secured with: Tape Dental Injury: Teeth and Oropharynx as per pre-operative assessment

## 2021-09-15 NOTE — Procedures (Addendum)
INTERVENTIONAL NEURORADIOLOGY BRIEF POSTPROCEDURE NOTE   DIAGNOSTIC CEREBRAL ANGIOGRAM    Attending: Dr. Marjo Bicker   Assistant: Dr. Salvadore Dom   Diagnosis: Left M1 occlusion   Access site: RCFA, 32F   Access closure: Perclose Prostyle    Anesthesia: GETA   Medication used: refer to anesthesia documentation.   Complications: none.   Estimated blood loss: 30 mL   Specimen: None.   Findings: Occlusion of the proximal left M1/MCA. Mechanical thrombectomy performed with direct contact aspiration x1 with complete recanalization of the MCA(TICI 3).   The patient tolerated the procedure well without incident and [facility] intubated in a stable condition.    PLAN: - Bed rest x6 hours post femoral puncture - SBP 120-140 mmHg  Detail report to follow

## 2021-09-15 NOTE — ED Provider Notes (Signed)
[facility] Provider Note   CSN: [id number] Arrival date & time: 09/15/21  1522     History  Chief Complaint  Patient presents with   Code Stroke    Eugene Campbell is a 81 y.o. male.  Pt is an 81 yo male with a pmhx unkn.  Pt is unable to speak and there is nothing on Epic.  Per EMS, he was working outside and came inside.  When he came inside, he was normal around 1430.  Pt's wife noticed that he was not speaking or responding normally to her, so she called EMS.  EMS called a code stroke due to right arm and leg weakness, right sided facial droop, and aphasia.          Home Medications Prior to Admission medications   Not on File      Allergies    Patient has no allergy information on record.    Review of Systems   Review of Systems  Unable to perform ROS: Patient nonverbal    Physical Exam Updated Vital Signs BP (!) 182/124 (BP Location: Right Arm)   Pulse (!) 56   Resp (!) 22   Wt 75.2 kg   SpO2 99%  Physical Exam Vitals and nursing note reviewed.  HENT:     Head: Normocephalic and atraumatic.     Comments: Right sided facial droop    Nose: Nose normal.     Mouth/Throat:     Mouth: Mucous membranes are moist.     Pharynx: Oropharynx is clear.  Eyes:     Extraocular Movements: Extraocular movements intact.     Conjunctiva/sclera: Conjunctivae normal.     Pupils: Pupils are equal, round, and reactive to light.  Cardiovascular:     Rate and Rhythm: Normal rate and regular rhythm.     Pulses: Normal pulses.     Heart sounds: Normal heart sounds.  Pulmonary:     Effort: Pulmonary effort is normal.     Breath sounds: Normal breath sounds.  Abdominal:     General: Abdomen is flat. Bowel sounds are normal.     Palpations: Abdomen is soft.  Musculoskeletal:        General: Normal range of motion.     Cervical back: Normal range of motion and neck supple.  Skin:    General: Skin is warm.     Capillary Refill:  Capillary refill takes less than 2 seconds.  Neurological:     Mental Status: He is alert.     Comments: Right sided arm and leg weakness  Psychiatric:        Mood and Affect: Mood normal.     ED Results / Procedures / Treatments   Labs (all labs ordered are listed, but only abnormal results are displayed) Labs Reviewed  CBC - Abnormal; Notable for the following components:      Result Value   Platelets 109 (*)    All other components within normal limits  COMPREHENSIVE METABOLIC PANEL - Abnormal; Notable for the following components:   Glucose, Bld 113 (*)    Creatinine, Ser 1.54 (*)    Calcium 8.6 (*)    Total Protein 5.9 (*)    Albumin 3.2 (*)    GFR, Estimated 45 (*)    All other components within normal limits  I-STAT CHEM 8, ED - Abnormal; Notable for the following components:   Creatinine, Ser 1.60 (*)    Glucose, Bld 104 (*)  Calcium, Ion 0.89 (*)    All other components within normal limits  CBG MONITORING, ED - Abnormal; Notable for the following components:   Glucose-Capillary 122 (*)    All other components within normal limits  SARS CORONAVIRUS 2 BY RT PCR  PROTIME-INR  APTT  DIFFERENTIAL  ETHANOL  HEMOGLOBIN A1C  URINALYSIS, COMPLETE (UACMP) WITH MICROSCOPIC  BLOOD GAS, ARTERIAL  MAGNESIUM  PHOSPHORUS  TYPE AND SCREEN  TROPONIN I (HIGH SENSITIVITY)    EKG None  Radiology CT ANGIO HEAD NECK W WO CM (CODE STROKE)  Result Date: 09/15/2021 CLINICAL DATA:  Provided history: Neuro deficit, acute, stroke suspected; right-sided weakness and facial droop. EXAM: CT ANGIOGRAPHY HEAD AND NECK TECHNIQUE: Multidetector CT imaging of the head and neck was performed using the standard protocol during bolus administration of intravenous contrast. Multiplanar CT image reconstructions and MIPs were obtained to evaluate the vascular anatomy. Carotid stenosis measurements (when applicable) are obtained utilizing NASCET criteria, using the distal internal carotid  diameter as the denominator. RADIATION DOSE REDUCTION: This exam was performed according to the departmental dose-optimization program which includes automated exposure control, adjustment of the mA and/or kV according to patient size and/or use of iterative reconstruction technique. CONTRAST:  48mL OMNIPAQUE IOHEXOL 350 MG/ML SOLN COMPARISON:  Noncontrast head CT performed immediately prior 09/15/2021. FINDINGS: CTA NECK FINDINGS Aortic arch: Standard aortic branching. Atherosclerotic plaque within the visualized aortic arch and proximal major branch vessels of the neck. No hemodynamically significant innominate or proximal subclavian artery stenosis. Right carotid system: CCA and ICA patent within the neck. Mild scattered atherosclerotic plaque within the proximal to mid CCA. Prominent predominantly calcified plaque about the carotid bifurcation and within the proximal ICA. Resultant in 50-60% stenosis at the origin of the right ICA. Left carotid system: CCA and ICA patent within the neck. Mild atherosclerotic plaque scattered within the proximal to mid CCA. Prominent atherosclerotic plaque about the carotid bifurcation and within the proximal ICA. Resultant 40% stenosis of the proximal ICA. Vertebral arteries: Vertebral arteries patent within the neck. The left vertebral artery is dominant. Severe atherosclerotic stenosis at the origin of the right vertebral artery. Mild atherosclerotic narrowing at the origin of the left vertebral artery. Skeleton: Cervical spondylosis. No acute fracture or aggressive osseous lesion. Other neck: No neck mass or cervical lymphadenopathy. Upper chest: Prior median sternotomy. Smooth interlobular septal thickening within the imaged lung apices, likely reflecting interstitial edema. Partially imaged right pleural effusion. Review of the MIP images confirms the above findings CTA HEAD FINDINGS Anterior circulation: The intracranial internal carotid arteries are patent.  Atherosclerotic plaque within both vessels. Most notably, there is moderate atherosclerotic narrowing of the distal cavernous/paraclinoid right ICA, and severe stenosis of the distal cavernous/paraclinoid left ICA. The left M1 MCA segment is occluded at its origin. There is some reconstitution of enhancement within M2 and more distal left MCA vessels. The right M1 segment is patent. Atherosclerotic irregularity of the M2 and more distal right MCA vessels. No appreciable right M2 proximal branch occlusion or high-grade proximal stenosis. The anterior cerebral arteries are patent. Posterior circulation: The intracranial vertebral arteries are patent. Nonstenotic atherosclerotic plaque within the non dominant V4 right vertebral artery. The basilar artery is patent. The posterior cerebral arteries are patent. Atherosclerotic irregularity of both vessels without high-grade proximal stenosis. Posterior communicating arteries are diminutive or absent, bilaterally. Venous sinuses: Within the limitations of contrast timing, no convincing thrombus. Anatomic variants: As described. Review of the MIP images confirms the above findings CTA head impression #1  called by telephone at the time of interpretation on 09/15/2021 at 3:50 pm to provider Isla Pence, who verbally acknowledged these results. IMPRESSION: CTA neck: 1. The common carotid, internal carotid and vertebral arteries are patent within the neck. 2. Atherosclerotic narrowing at the origin of the right ICA of 50-60%. 3. Atherosclerotic narrowing at the origin of the left ICA of 40%. 4. Severe stenosis at the origin of the non-dominant right vertebral artery. 5. Mild atherosclerotic narrowing at the origin of the dominant left vertebral artery. 6.  Aortic Atherosclerosis (ICD10-I70.0). 7. Interstitial edema within the imaged lung apices. 8. Partially imaged right pleural effusion. CTA head: 1. The M1 segment of the left middle cerebral artery is occluded at its  origin. There is some reconstitution of enhancement within M2 and more distal left MCA vessels, likely due to collateral flow. 2. 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 3. Severe stenosis of the distal cavernous/paraclinoid left ICA. 4. Moderate stenosis of the distal cavernous/paraclinoid right ICA. Electronically Signed   By: Kellie Simmering D.O.   On: 09/15/2021 16:20   CT HEAD CODE STROKE WO CONTRAST  Result Date: 09/15/2021 CLINICAL DATA:  Code stroke. Neuro deficit, acute, stroke suspected. Right-sided weakness and facial droop. EXAM: CT HEAD WITHOUT CONTRAST TECHNIQUE: Contiguous axial images were obtained from the base of the skull through the vertex without intravenous contrast. RADIATION DOSE REDUCTION: This exam was performed according to the departmental dose-optimization program which includes automated exposure control, adjustment of the mA and/or kV according to patient size and/or use of iterative reconstruction technique. COMPARISON:  No pertinent prior exams available for comparison. FINDINGS: Brain: Mild generalized cerebral atrophy. Suspected subtle loss of gray-white differentiation within the left insula (for instance as seen on series 2, image 18). Small left MCA territory cortical/subcortical infarct within the left parietal lobe (involving the left postcentral gyrus). This is favored chronic. Background mild patchy and ill-defined hypoattenuation within the cerebral white matter, nonspecific but compatible chronic small vessel ischemic disease. This includes a chronic lacunar infarct within the right centrum semiovale/corona radiata. 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 There is no acute intracranial hemorrhage. No extra-axial fluid collection. No evidence of an intracranial mass. No midline shift. Vascular: Hyperdense M1 left MCA segment, highly suspicious for presence of endoluminal thrombus. Skull: No  fracture or aggressive osseous lesion. Sinuses/Orbits: No mass or acute finding within the imaged orbits. Small mucous retention cyst versus polyp within the left maxillary sinus. Minimal mucosal thickening within the right sphenoid sinus and scattered within the bilateral ethmoid sinuses. ASPECTS ([place] Stroke Program Early CT Score) - Ganglionic level infarction (caudate, lentiform nuclei, internal capsule, insula, M1-M3 cortex): 6 - Supraganglionic infarction (M4-M6 cortex): 3 Total score (0-10 with 10 being normal): 9 Critical/emergent results were called by telephone at the time of interpretation on 09/15/2021 at 3:45 pm to provider Tatsuo Musial , who verbally acknowledged these results. IMPRESSION: 1. Hyperdense left M1 MCA segment highly suspicious for the presence of endoluminal thrombus. 2. Suspected subtle acute infarct involving the left insula. ASPECTS is 9. 3. Small left MCA territory cortically-based infarct within the left parietal lobe (with involvement of the postcentral gyrus), favored chronic. 4. Background mild chronic small ischemic changes within the cerebral white matter. This includes a chronic lacunar infarct within the right centrum semiovale/corona radiata. 5. 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 6. Mild generalized parenchymal atrophy. Electronically Signed   By: Kellie Simmering D.O.   On: 09/15/2021  15:50    Procedures Procedures    Medications Ordered in ED Medications  sodium chloride flush (NS) 0.9 % injection 3 mL (has no administration in time range)   stroke: early stages of recovery book (has no administration in time range)  0.9 %  sodium chloride infusion (has no administration in time range)  acetaminophen (TYLENOL) tablet 650 mg (has no administration in time range)    Or  acetaminophen (TYLENOL) 160 MG/5ML solution 650 mg (has no administration in time range)    Or  acetaminophen (TYLENOL) suppository 650 mg (has no  administration in time range)  senna-docusate (Senokot-S) tablet 1 tablet (has no administration in time range)  pantoprazole (PROTONIX) injection 40 mg (has no administration in time range)  clevidipine (CLEVIPREX) infusion 0.5 mg/mL (has no administration in time range)  aspirin 81 MG chewable tablet (has no administration in time range)  verapamil (ISOPTIN) 2.5 MG/ML injection (has no administration in time range)  ticagrelor (BRILINTA) 90 MG tablet (has no administration in time range)  clopidogrel (PLAVIX) 300 MG tablet (has no administration in time range)  tirofiban (AGGRASTAT) 5-0.9 MG/100ML-% injection (has no administration in time range)  cangrelor (KENGREAL) 50 MG SOLR (has no administration in time range)  eptifibatide (INTEGRILIN) 20 MG/10ML injection (has no administration in time range)  nitroGLYCERIN 100 mcg/mL intra-arterial injection (has no administration in time range)  clevidipine (CLEVIPREX) 0.5 MG/ML infusion (has no administration in time range)  iohexol (OMNIPAQUE) 350 MG/ML injection 75 mL (75 mLs Intravenous Contrast Given 09/15/21 1543)  fentaNYL (SUBLIMAZE) 100 MCG/2ML injection (  Override pull for Anesthesia 09/15/21 1615)  iohexol (OMNIPAQUE) 300 MG/ML solution 100 mL (25 mLs Intra-arterial Contrast Given 09/15/21 1625)    ED Course/ Medical Decision Making/ A&P                           Medical Decision Making Amount and/or Complexity of Data Reviewed Labs: ordered. Radiology: ordered.  Risk Decision regarding hospitalization.   This patient presents to the ED for concern of stroke, this involves an extensive number of treatment options, and is a complaint that carries with it a high risk of complications and morbidity.  The differential diagnosis includes cva, head bleed, tia   Co morbidities that complicate the patient evaluation  unkn   Additional history obtained:  Additional history obtained from EMS report    Lab Tests:  I Ordered,  and personally interpreted labs.  The pertinent results include:  CMP with Cr 1.54, cbc nl other than plt 109, inr 1.1   Imaging Studies ordered:  I ordered imaging studies including CT head/CTA head  I independently visualized and interpreted imaging which showed  CT Head/CT angio head: IMPRESSION:  1. Hyperdense left M1 MCA segment highly suspicious for the presence  of endoluminal thrombus.  2. Suspected subtle acute infarct involving the left insula. ASPECTS  is 9.  3. Small left MCA territory cortically-based infarct within the left  parietal lobe (with involvement of the postcentral gyrus), favored  chronic.  4. Background mild chronic small ischemic changes within the  cerebral white matter. This includes a chronic lacunar infarct  within the right centrum semiovale/corona radiata.  5. Multiple small chronic infarcts within the right cerebellar  hemisphere.  6. Mild generalized parenchymal atrophy.   I agree with the radiologist interpretation   Cardiac Monitoring:  The patient was maintained on a cardiac monitor.  I personally viewed and interpreted the cardiac monitored  which showed an underlying rhythm of: nsr   Test Considered:  Ct head/ CT angio head   Critical Interventions:  Code stroke   Consultations Obtained:  I requested consultation with the neurologist (Dr. Theda Sers),  and discussed lab and imaging findings as well as pertinent plan -he is sending pt to IR and will admit.  Problem List / ED Course:  CVA:  pt has a clot in the left M1 MCA segment.  Pt to IR.    Social Determinants of Health:  Lives with wife   Dispostion:  After consideration of the diagnostic results and the patients response to treatment, I feel that the patent would benefit from admission.    CRITICAL CARE Performed by: Isla Pence   Total critical care time: 30 minutes  Critical care time was exclusive of separately billable procedures and treating other  patients.  Critical care was necessary to treat or prevent imminent or life-threatening deterioration.  Critical care was time spent personally by me on the following activities: development of treatment plan with patient and/or surrogate as well as nursing, discussions with consultants, evaluation of patient's response to treatment, examination of patient, obtaining history from patient or surrogate, ordering and performing treatments and interventions, ordering and review of laboratory studies, ordering and review of radiographic studies, pulse oximetry and re-evaluation of patient's condition.         Final Clinical Impression(s) / ED Diagnoses Final diagnoses:  Acute ischemic stroke [facility])    Rx / DC Orders ED Discharge Orders     None         Isla Pence, MD 09/15/21 1631

## 2021-09-15 NOTE — Progress Notes (Signed)
RT attempted to get ABG. Patient not in ED at this time.

## 2021-09-16 ENCOUNTER — Inpatient Hospital Stay (HOSPITAL_COMMUNITY): Payer: Medicare HMO

## 2021-09-16 ENCOUNTER — Inpatient Hospital Stay: Payer: Self-pay

## 2021-09-16 ENCOUNTER — Encounter (HOSPITAL_COMMUNITY): Payer: Self-pay | Admitting: Radiology

## 2021-09-16 DIAGNOSIS — J690 Pneumonitis due to inhalation of food and vomit: Secondary | ICD-10-CM | POA: Diagnosis not present

## 2021-09-16 DIAGNOSIS — I639 Cerebral infarction, unspecified: Secondary | ICD-10-CM | POA: Diagnosis not present

## 2021-09-16 DIAGNOSIS — I4811 Longstanding persistent atrial fibrillation: Secondary | ICD-10-CM | POA: Diagnosis not present

## 2021-09-16 DIAGNOSIS — I6389 Other cerebral infarction: Secondary | ICD-10-CM

## 2021-09-16 DIAGNOSIS — I255 Ischemic cardiomyopathy: Secondary | ICD-10-CM

## 2021-09-16 DIAGNOSIS — Z86718 Personal history of other venous thrombosis and embolism: Secondary | ICD-10-CM

## 2021-09-16 DIAGNOSIS — J9601 Acute respiratory failure with hypoxia: Secondary | ICD-10-CM | POA: Diagnosis not present

## 2021-09-16 DIAGNOSIS — Z86711 Personal history of pulmonary embolism: Secondary | ICD-10-CM

## 2021-09-16 LAB — CBC
HCT: 42.9 % (ref 39.0–52.0)
Hemoglobin: 14.6 g/dL (ref 13.0–17.0)
MCH: 32.5 pg (ref 26.0–34.0)
MCHC: 34 g/dL (ref 30.0–36.0)
MCV: 95.5 fL (ref 80.0–100.0)
Platelets: 112 10*3/uL — ABNORMAL LOW (ref 150–400)
RBC: 4.49 MIL/uL (ref 4.22–5.81)
RDW: 13.2 % (ref 11.5–15.5)
WBC: 11.9 10*3/uL — ABNORMAL HIGH (ref 4.0–10.5)
nRBC: 0 % (ref 0.0–0.2)

## 2021-09-16 LAB — BASIC METABOLIC PANEL
Anion gap: 10 (ref 5–15)
BUN: 14 mg/dL (ref 8–23)
CO2: 23 mmol/L (ref 22–32)
Calcium: 8.9 mg/dL (ref 8.9–10.3)
Chloride: 103 mmol/L (ref 98–111)
Creatinine, Ser: 1.39 mg/dL — ABNORMAL HIGH (ref 0.61–1.24)
GFR, Estimated: 51 mL/min — ABNORMAL LOW (ref >60)
Glucose, Bld: 161 mg/dL — ABNORMAL HIGH (ref 70–99)
Potassium: 4.6 mmol/L (ref 3.5–5.1)
Sodium: 136 mmol/L (ref 135–145)

## 2021-09-16 LAB — GLUCOSE, CAPILLARY
Glucose-Capillary: 171 mg/dL — ABNORMAL HIGH (ref 70–99)
Glucose-Capillary: 206 mg/dL — ABNORMAL HIGH (ref 70–99)
Glucose-Capillary: 140 mg/dL — ABNORMAL HIGH (ref 70–99)
Glucose-Capillary: 98 mg/dL (ref 70–99)
Glucose-Capillary: 147 mg/dL — ABNORMAL HIGH (ref 70–99)
Glucose-Capillary: 148 mg/dL — ABNORMAL HIGH (ref 70–99)

## 2021-09-16 LAB — ECHOCARDIOGRAM COMPLETE
AR max vel: 1.2 cm2
AV Area VTI: 1.1 cm2
AV Area mean vel: 1.25 cm2
AV Mean grad: 8 mmHg
AV Peak grad: 16 mmHg
Ao pk vel: 2 m/s
Area-P 1/2: 4.06 cm2
Calc EF: 30.8 %
Height: 73 in
S' Lateral: 4.9 cm
Single Plane A2C EF: 40.3 %
Single Plane A4C EF: 18.2 %
Weight: 2652.57 oz

## 2021-09-16 LAB — URINALYSIS, COMPLETE (UACMP) WITH MICROSCOPIC
Bacteria, UA: NONE SEEN
Bilirubin Urine: NEGATIVE
Glucose, UA: >=500 mg/dL — AB
Hgb urine dipstick: NEGATIVE
Ketones, ur: NEGATIVE mg/dL
Leukocytes,Ua: NEGATIVE
Nitrite: NEGATIVE
Protein, ur: NEGATIVE mg/dL
Specific Gravity, Urine: 1.03 (ref 1.005–1.030)
pH: 5 (ref 5.0–8.0)

## 2021-09-16 LAB — LIPID PANEL
Cholesterol: 133 mg/dL (ref 0–200)
HDL: 59 mg/dL (ref >40)
LDL Cholesterol: 66 mg/dL (ref 0–99)
Total CHOL/HDL Ratio: 2.3 RATIO
Triglycerides: 38 mg/dL (ref <150)
VLDL: 8 mg/dL (ref 0–40)

## 2021-09-16 LAB — POCT I-STAT 7, (LYTES, BLD GAS, ICA,H+H)
Acid-base deficit: 4 mmol/L — ABNORMAL HIGH (ref 0.0–2.0)
Bicarbonate: 22.7 mmol/L (ref 20.0–28.0)
Calcium, Ion: 1.22 mmol/L (ref 1.15–1.40)
HCT: 47 % (ref 39.0–52.0)
Hemoglobin: 16 g/dL (ref 13.0–17.0)
O2 Saturation: 87 %
Potassium: 3.9 mmol/L (ref 3.5–5.1)
Sodium: 137 mmol/L (ref 135–145)
TCO2: 24 mmol/L (ref 22–32)
pCO2 arterial: 43.9 mmHg (ref 32–48)
pH, Arterial: 7.321 — ABNORMAL LOW (ref 7.35–7.45)
pO2, Arterial: 57 mmHg — ABNORMAL LOW (ref 83–108)

## 2021-09-16 LAB — TRIGLYCERIDES: Triglycerides: 38 mg/dL (ref <150)

## 2021-09-16 LAB — MRSA NEXT GEN BY PCR, NASAL: MRSA by PCR Next Gen: NOT DETECTED

## 2021-09-16 MED ORDER — ASPIRIN 81 MG PO CHEW
324.0000 mg | CHEWABLE_TABLET | Freq: Every day | ORAL | Status: DC
Start: 2021-09-17 — End: 2021-09-17
  Administered 2021-09-17: 324 mg
  Filled 2021-09-16: qty 4

## 2021-09-16 MED ORDER — HEPARIN SODIUM (PORCINE) 5000 UNIT/ML IJ SOLN
5000.0000 [IU] | Freq: Three times a day (TID) | INTRAMUSCULAR | Status: DC
  Administered 2021-09-16 – 2021-09-17 (×2): 5000 [IU] via SUBCUTANEOUS
  Filled 2021-09-16 (×2): qty 1

## 2021-09-16 MED ORDER — SODIUM CHLORIDE 0.9 % IV SOLN: 2.0000 g | INTRAVENOUS | Status: DC

## 2021-09-16 MED ORDER — SODIUM CHLORIDE 0.9 % IV SOLN
2.0000 g | INTRAVENOUS | Status: AC
  Administered 2021-09-16 – 2021-09-20 (×5): 2 g via INTRAVENOUS
  Filled 2021-09-16 (×5): qty 20

## 2021-09-16 MED ORDER — SODIUM PHOSPHATES 45 MMOLE/15ML IV SOLN
15.0000 mmol | Freq: Once | INTRAVENOUS | Status: AC
  Administered 2021-09-16: 15 mmol via INTRAVENOUS
  Filled 2021-09-16: qty 5

## 2021-09-16 MED ORDER — NOREPINEPHRINE 4 MG/250ML-% IV SOLN
0.0000 ug/min | INTRAVENOUS | Status: DC
  Administered 2021-09-17: 20 ug/min via INTRAVENOUS
  Administered 2021-09-17 (×3): 25 ug/min via INTRAVENOUS
  Administered 2021-09-17: 15 ug/min via INTRAVENOUS
  Filled 2021-09-16 (×2): qty 250
  Filled 2021-09-16: qty 500
  Filled 2021-09-16: qty 250

## 2021-09-16 MED ORDER — FENTANYL BOLUS VIA INFUSION
25.0000 ug | INTRAVENOUS | Status: DC | PRN
  Administered 2021-09-16: 100 ug via INTRAVENOUS

## 2021-09-16 MED ORDER — INSULIN ASPART 100 UNIT/ML IJ SOLN
0.0000 [IU] | INTRAMUSCULAR | Status: DC
  Administered 2021-09-16 (×3): 2 [IU] via SUBCUTANEOUS
  Administered 2021-09-17 (×2): 3 [IU] via SUBCUTANEOUS
  Administered 2021-09-17 (×2): 2 [IU] via SUBCUTANEOUS

## 2021-09-16 MED ORDER — FENTANYL 2500MCG IN NS 250ML (10MCG/ML) PREMIX INFUSION
INTRAVENOUS | Status: AC
  Administered 2021-09-16: 25 ug/h via INTRAVENOUS
  Filled 2021-09-16: qty 250

## 2021-09-16 MED ORDER — MAGNESIUM SULFATE 2 GM/50ML IV SOLN
2.0000 g | Freq: Once | INTRAVENOUS | Status: AC
Start: 2021-09-16 — End: 2021-09-16
  Administered 2021-09-16: 2 g via INTRAVENOUS
  Filled 2021-09-16: qty 50

## 2021-09-16 MED ORDER — FENTANYL 2500MCG IN NS 250ML (10MCG/ML) PREMIX INFUSION: 25.0000 ug/h | INTRAVENOUS | Status: DC

## 2021-09-16 MED ORDER — SODIUM CHLORIDE 0.9% FLUSH
10.0000 mL | INTRAVENOUS | Status: DC | PRN
  Administered 2021-09-20: 30 mL

## 2021-09-16 MED ORDER — ONDANSETRON HCL 4 MG/2ML IJ SOLN
INTRAMUSCULAR | Status: AC
  Administered 2021-09-16: 4 mg
  Filled 2021-09-16: qty 2

## 2021-09-16 MED ORDER — SODIUM CHLORIDE 0.9% FLUSH
10.0000 mL | Freq: Two times a day (BID) | INTRAVENOUS | Status: DC
  Administered 2021-09-16 – 2021-09-18 (×4): 10 mL
  Administered 2021-09-19: 30 mL
  Administered 2021-09-19 – 2021-09-22 (×6): 10 mL

## 2021-09-16 MED ORDER — SODIUM CHLORIDE 0.9 % IV SOLN
250.0000 mL | INTRAVENOUS | Status: DC
  Administered 2021-09-16: 250 mL via INTRAVENOUS

## 2021-09-16 MED ORDER — ONDANSETRON HCL 4 MG/2ML IJ SOLN: 4.0000 mg | Freq: Once | INTRAMUSCULAR | Status: AC

## 2021-09-16 MED ORDER — NOREPINEPHRINE 4 MG/250ML-% IV SOLN
2.0000 ug/min | INTRAVENOUS | Status: DC
  Administered 2021-09-16: 15 ug/min via INTRAVENOUS
  Administered 2021-09-16: 10 ug/min via INTRAVENOUS
  Filled 2021-09-16 (×2): qty 250

## 2021-09-16 NOTE — Progress Notes (Signed)
NAME:  Eugene Campbell, MRN:  [id number], DOB:  19-May-1940, LOS: 1 ADMISSION DATE:  09/15/2021, CONSULTATION DATE:  09/15/21 REFERRING MD:  Thomasena Edis CHIEF COMPLAINT:  Vent management   History of Present Illness:  Eugene Campbell is a 81 y.o. 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 He presented to [facility] 6/14 with stroke like symptoms (dysphasia, right hemiplegia) that began suddenly that afternoon after he had been working outside prior. LKN 1430.  CTH demonstrated hyperdense L M1 MCA segment with suspected subtle acute infarct involving left insula. CTA head demonstrated occluded M1 segment of L MCA.  He was taken to IR where he had cerebral angiogram with mechanical thrombectomy with complete recanalization of the L MCA (TICI 3).  Post procedure, he remained ventilated and PCCM was called for assistance with vent management.  Pertinent  Medical History:  has Acute ischemic stroke ([name]); Acute respiratory failure with hypoxia ([name]); and Longstanding persistent atrial fibrillation ([name]) on their problem list.  Significant Hospital Events: Including procedures, antibiotic start and stop dates in addition to other pertinent events   6/14 admit.  Interim History / Subjective:  Remains on Cleviprex Massive emesis this morning, 1L plus. Vomitus in the vent HME so it is very likely he aspirated.   Objective:  Blood pressure (!) 121/53, pulse (!) 53, temperature (!) 96.4 F (35.8 C), temperature source Axillary, resp. rate 14, height 6\' 1"  (1.854 m), weight 75.2 kg, SpO2 92 %.    Vent Mode: PRVC FiO2 (%):  [40 %-100 %] 40 % Set Rate:  [14 bmp-16 bmp] 14 bmp Vt Set:  [630 mL] 630 mL PEEP:  [5 cmH20] 5 cmH20 Plateau Pressure:  [10 cmH20-16 cmH20] 13 cmH20   Intake/Output Summary (Last 24 hours) at 09/16/2021 0736 Last data filed  at 09/16/2021 0700 Gross per 24 hour  Intake 1664.97 ml  Output 2450 ml  Net -785.03 ml    Filed Weights   09/15/21 1527  Weight: 75.2 kg    Examination:  General: Elderly male, thin, no acute distress. Neuro: Intermittently agitated despite sedation. Was able to follow commands in all four extremities. HEENT: [place]/AT, PERRL, no JVD Cardiovascular: RRR, no MRG Lungs: Vent supported breaths. Coarse bilaterally Abdomen: Soft, non-tender, non-distended Musculoskeletal: No acute deformity or edema Skin: Intact, warm, no rashes  Labs/imaging personally reviewed:  [facility] 6/14 > hyperdense L M1 MCA segment with suspected subtle acute infarct involving left insula.  CTA head 6/14 > occluded M1 segment of L MCA.CTH demonstrated hyperdense L M1 MCA segment with suspected subtle acute infarct involving left insula. CTA head demonstrated occluded M1 segment of L MCA. MRI brain 6/15 > patchy acute infarct in the left BG and left medial temporal lobe. MRA brain 6/15 > Recanalized Left MCA M1 segment. Patent left MCA bifurcation. Upstream ICA atherosclerosis and stenosis, and downstream MCA M2 branch stenosis appears unchanged from the CTA yesterday. 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 - Post procedure management per IR. - Stroke workup / management per neuro. - F/u on echo.  Hypertension. - Cleviprex PRN for goal SBP 120 - 140 per neuro - Needs pharmacy assistance for med rec as care everywhere shows discrepancies in meds  versus some of cardiology notes etc - Will continue to hold oral meds (vomiting)  Respiratory insufficiency - due to inability to protect the airway in the setting of above. - Full vent support. - Massive aspiration likely on SBT today. Need to regain stability and possibly re-assess later.  - Start ceftriaxone - Bronchial  hygiene. - CXR now  AKI Hypocalcemia - Supportive care with gentle fluids. - Follow BMP  Projectile vomiting: was awake and following commands just seconds prior so I doubt this is related to neurologic complications. KUB with dilated bowel loops so I think this more likely represents ileus or obstruction of some kind.  - Bowel rest - OGT advance 5 cm and place to low intermittent suction - Will discuss further imaging with MD  Bradycardia - ? 2/2 high dose propofol, now lowered and RN to adjust accordingly. Hx A.fib on Eliquis and Amiodarone, HTN, sCHF and NICM (Echo from Oct 2022 with EF 20-25%)  - Monitor closely - Holding home amiodarone for now - Defer to neuro regarding timing of resuming AC  Hx PE and DVT (Oct 2022). - Supportive care.  Hx Hypothyroidism. - Continue home Synthroid (75 daily)    Best practice (evaluated daily):  Per primary  Labs   CBC: Recent Labs  Lab 09/15/21 1525 09/15/21 1532 09/15/21 1732 09/16/21 0409  WBC 7.8  --   --  11.9*  NEUTROABS 4.2  --   --   --   HGB 14.5 14.6 10.9* 14.6  HCT 43.2 43.0 32.0* 42.9  MCV 98.6  --   --  95.5  PLT 109*  --   --  112*     Basic Metabolic Panel: Recent Labs  Lab 09/15/21 1525 09/15/21 1532 09/15/21 1732 09/15/21 1946 09/16/21 0409  NA 137 136 134*  --  136  K 4.8 4.6 4.1  --  4.6  CL 101 99  --   --  103  CO2 27  --   --   --  23  GLUCOSE 113* 104*  --   --  161*  BUN 15 18  --   --  14  CREATININE 1.54* 1.60*  --   --  1.39*  CALCIUM 8.6*  --   --   --  8.9  MG  --   --   --  1.9  --   PHOS  --   --   --  2.2*  --     GFR: Estimated Creatinine Clearance: 45.1 mL/min (A) (by C-G formula based on SCr of 1.39 mg/dL (H)). Recent Labs  Lab 09/15/21 1525 09/16/21 0409  WBC 7.8 11.9*     Liver Function Tests: Recent Labs  Lab 09/15/21 1525  AST 34  ALT 28  ALKPHOS 94  BILITOT 0.8  PROT 5.9*  ALBUMIN 3.2*    No results for input(s): "LIPASE", "AMYLASE" in the last 168  hours. No results for input(s): "AMMONIA" in the last 168 hours.  ABG    Component Value Date/Time   PHART 7.503 (H) 09/15/2021 1732   PCO2ART 35.6 09/15/2021 1732   PO2ART 293 (H) 09/15/2021 1732   HCO3 28.1 (H) 09/15/2021 1732   TCO2 29 09/15/2021 1732   O2SAT 100 09/15/2021 1732     Coagulation Profile: Recent Labs  Lab 09/15/21 1525  INR 1.1     Cardiac Enzymes: No results for input(s): "CKTOTAL", "CKMB", "CKMBINDEX", "TROPONINI" in the last 168 hours.  HbA1C: Hgb A1c MFr Bld  Date/Time Value  Ref Range Status  09/15/2021 07:46 PM 5.9 (H) 4.8 - 5.6 % Final    Comment:    (NOTE) Pre diabetes:          5.7%-6.4%  Diabetes:              >6.4%  Glycemic control for   <7.0% adults with diabetes     CBG: Recent Labs  Lab 09/15/21 1524 09/15/21 1841 09/15/21 1932 09/15/21 2326 09/16/21 0406  GLUCAP 122* 147* 178* 228* 171*     Review of Systems:   Unable to obtain as pt is encephalopathic.  Past Medical History:  He,  has a past medical history of Aortic stenosis, Chronic renal impairment, stage 3 (moderate), unspecified whether stage 3a or 3b CKD ([place]), Diabetes ([place]), HFrEF (heart failure with reduced ejection fraction) ([place]), Hypothyroidism, and PE (pulmonary thromboembolism) ([place]) (2022).   Surgical History:  Unavailable.  Social History:   reports that he quit smoking about 50 years ago. His smoking use included cigarettes. He has a 13.00 pack-year smoking history. He does not have any smokeless tobacco history on file. He reports that he does not drink alcohol.   Family History:  His family history is not on file.   Allergies Allergies  Allergen Reactions   Penicillins Hives, Swelling, Rash and Other (See Comments)    Peeling skin and caused the gums to peel/crack     Home Medications  Prior to Admission medications   Not on File     Critical care time:  60 minutes     Georgann Housekeeper, AGACNP-BC [place] for personal pager PCCM on call pager [phone] until 7pm. Please call Elink 7p-7a. [phone]  09/16/2021 7:39 AM

## 2021-09-16 NOTE — Progress Notes (Signed)
Pt transported from 3M12 to MRI 3 and back w/o complications. RT will cont to monitor as needed.

## 2021-09-16 NOTE — TOC CAGE-AID Note (Signed)
Transition of Care [facility]) - CAGE-AID Screening   Patient Details  Name: Worthy Boschert MRN: [id number] Date of Birth: May 09, 1940  Transition of Care [facility]) CM/SW Contact:    Ezrah Dembeck C Tarpley-Carter, LCSWA Phone Number: 09/16/2021, 11:13 AM   Clinical Narrative: Pt is unable to participate in Cage Aid. Pt is currently absent from room.  Ante Arredondo Tarpley-Carter, MSW, LCSW-A Pronouns:  She/Her/Hers [facility] HealthTransitions of Care Clinical Social Worker Direct Number:  [phone] [email or web address]  CAGE-AID Screening: Substance Abuse Screening unable to be completed due to: : Patient unable to participate

## 2021-09-16 NOTE — Progress Notes (Signed)
Referring Physician(s): * No referring provider recorded for this case *  Supervising Physician: Gerhard Perches  Patient Status:  [name] - In-pt  Chief Complaint:  Stroke  Brief History:  [name] is an 81 y/o gentleman with a history of paroxysmal Afib on eliquis, HFrEF, chronic DVT & PE, and DM.  He presented with new onset asphasia, R hemiparesis, R facial droop.  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   Due to Eliquis and age he was deemed not to be a candidate for TPA.   He underwent mechanical thrombectomy with complete recanalization of the L MCA by Dr. Gerhard Perches.   He remains intubated and sedated.    Allergies: Penicillins  Medications: Prior to Admission medications   Medication Sig Start Date End Date Taking? Authorizing Provider  B Complex Vitamins (VITAMIN B COMPLEX) TABS Take 1 tablet by mouth daily with breakfast.   Yes [provider]  Cholecalciferol (VITAMIN D3) 25 MCG (1000 UT) CAPS Take 1,000 Units by mouth daily.   Yes [provider]  Coenzyme Q10 (COQ-10 PO) Take 1 capsule by mouth daily.   Yes [provider]  Cyanocobalamin (VITAMIN B-12) 1000 MCG SUBL Place 1,000 mcg under the tongue daily.   Yes [provider]  ELIQUIS 5 MG TABS tablet Take 5 mg by mouth 2 (two) times daily. 09/02/21  Yes [provider]  ferrous sulfate 325 (65 FE) MG tablet Take 325 mg by mouth daily with breakfast.   Yes [provider]  furosemide (LASIX) 40 MG tablet Take 40 mg by mouth in the morning. 09/02/21  Yes [provider]  levothyroxine (SYNTHROID) 75 MCG tablet Take 75 mcg by mouth See admin instructions. Take 75 mcg by mouth before breakfast on Mon/Tues/Wed/Thurs/Fri 07/30/21  Yes [provider]     Vital Signs: BP (!) 97/53   Pulse 63   Temp 98.1 F (36.7 C) (Axillary)   Resp 14   Ht 6' 1"  (1.854 m)   Wt 165 lb 12.6 oz (75.2 kg)   SpO2 99%   BMI 21.87 kg/m   Physical Exam Intubated on  Vent Sedated Right CFA access site looks good. Current undergoing TTE -  tech at Bedside  Imaging: [place] EKG SITE RITE  Result Date: 09/16/2021 If Site Rite image not attached, placement could not be confirmed due to current cardiac rhythm.  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 Patient's last known normal was at 1430 NIH stroke scale of 24, modified Rankin scale of 1. TNK was not given as the patient was on apixaban. EXAM: ULTRASOUND-GUIDED VASCULAR ACCESS DIAGNOSTIC CEREBRAL ANGIOGRAM MECHANICAL THROMBECTOMY FLAT PANEL HEAD CT COMPARISON:  None Available. MEDICATIONS: Ancef 2 g IV. The antibiotic was administered within 1 hour of the procedure Performing surgeon: Dr. Frazier Richards Assistant: Dr. Karenann Cai ANESTHESIA/SEDATION: General anesthesia CONTRAST:  Twenty-five mL of Omnipaque 300 milligram/mL FLUOROSCOPY: Radiation Exposure Index (as provided by the fluoroscopic device): 937 mGy Kerma COMPLICATIONS: SIR Level A - No therapy, no consequence. TECHNIQUE: Informed written consent was obtained from the patient's wife after a thorough discussion of the procedural risks, benefits and alternatives. All questions were addressed. Maximal Sterile Barrier Technique was utilized including caps, mask, sterile gowns, sterile gloves, sterile drape, hand hygiene and skin antiseptic. A timeout was performed  prior to the initiation of the procedure. The right groin was prepped and draped in the usual sterile fashion. Using a micropuncture kit and the modified Seldinger technique, access was gained to the right common femoral artery and an 8 [place] by 25 cm sheath was placed. Real-time ultrasound guidance was utilized for vascular access including the acquisition of a permanent  ultrasound image documenting patency of the accessed vessel. Under fluoroscopy, a Zoom 88 guide catheter was navigated over a 5 [place] VTK catheter and a 0.035" Terumo Glidewire into the aortic arch. The catheter was placed into the left common carotid artery and then advanced into the left internal carotid artery under roadmap guidance. The diagnostic catheter was removed. Frontal and lateral angiograms of the head were obtained. FINDINGS: 1. Normal caliber of the right common femoral artery, adequate for vascular access. 2. Occlusion of a proximal left M1/MCA. 3. Delayed collaterals from the left ACA to the left MCA vascular tree. PROCEDURE: Using biplane roadmap, a zoom 071 aspiration catheter was navigated over an Aristotle 24 microguidewire into the cavernous segment of the left ICA. The aspiration catheter was then advanced to the level of occlusion at the left M1 MCA and connected to an aspiration pump. Continuous aspiration was performed for 2 minutes. The guide catheter was connected to a VacLok syringe. The aspiration catheter was subsequently removed under constant aspiration. The guide catheter was aspirated for debris. Then, biplane DSA cerebral angiogram was performed by injecting contrast through the guide catheter which demonstrated complete recanalization of the proximal left MCA and its branches (TICI 3). The MCA and its branches have a normal appearance. No aneurysm, av malformation, dural fistula or vasculitis seen. The venous phase of the cerebral angiogram has a normal appearance with a preferential venous return into the right transverse, sigmoid sinuses and the into the internal jugular vein. Mild narrowing of the left transverse sinus. Flat panel CT of the head was obtained and post processed in a separate workstation with concurrent attending physician supervision. Selected images were sent to PACS. Flat panel CT of the head has a normal appearance without evidence of a bleed. Right common  femoral artery angiogram was obtained in right anterior oblique view. The puncture is at the level of the common femoral artery. The artery has normal caliber, adequate for closure device. The sheath was exchanged over the wire for a Perclose prostyle which was utilized for access closure. Immediate hemostasis was achieved. IMPRESSION: 1. Successful mechanical thrombectomy for treatment of an occlusion of the proximal left M1/MCA. Complete recanalization (TICI 3) of the left MCA was attained. 2. Flat panel CT head was unremarkable. PLAN: 1. Transfer to ICU. 2. Blood pressure goals of 120-140 mm Hg. Electronically Signed   By: Frazier Richards M.D.   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 Patient's last known normal was at 1430 NIH stroke scale of 24, modified Rankin scale of 1. TNK was not given as the patient was on apixaban. EXAM: ULTRASOUND-GUIDED VASCULAR ACCESS DIAGNOSTIC CEREBRAL ANGIOGRAM MECHANICAL THROMBECTOMY FLAT PANEL HEAD CT COMPARISON:  None Available. MEDICATIONS: Ancef 2 g IV. The antibiotic was administered within 1 hour of the procedure Performing surgeon: Dr. Frazier Richards Assistant: Dr. Karenann Cai ANESTHESIA/SEDATION: General anesthesia CONTRAST:  Twenty-five mL of Omnipaque 300 milligram/mL FLUOROSCOPY: Radiation Exposure  Index (as provided by the fluoroscopic device): 606 mGy Kerma COMPLICATIONS: SIR Level A - No therapy, no consequence. TECHNIQUE: Informed written consent was obtained from the patient's wife after a thorough discussion of the procedural risks, benefits and alternatives. All questions were addressed. Maximal Sterile Barrier Technique was utilized including caps, mask, sterile gowns, sterile gloves, sterile drape, hand hygiene and skin antiseptic. A timeout  was performed prior to the initiation of the procedure. The right groin was prepped and draped in the usual sterile fashion. Using a micropuncture kit and the modified Seldinger technique, access was gained to the right common femoral artery and an 8 [place] by 25 cm sheath was placed. Real-time ultrasound guidance was utilized for vascular access including the acquisition of a permanent ultrasound image documenting patency of the accessed vessel. Under fluoroscopy, a Zoom 88 guide catheter was navigated over a 5 [place] VTK catheter and a 0.035" Terumo Glidewire into the aortic arch. The catheter was placed into the left common carotid artery and then advanced into the left internal carotid artery under roadmap guidance. The diagnostic catheter was removed. Frontal and lateral angiograms of the head were obtained. FINDINGS: 1. Normal caliber of the right common femoral artery, adequate for vascular access. 2. Occlusion of a proximal left M1/MCA. 3. Delayed collaterals from the left ACA to the left MCA vascular tree. PROCEDURE: Using biplane roadmap, a zoom 071 aspiration catheter was navigated over an Aristotle 24 microguidewire into the cavernous segment of the left ICA. The aspiration catheter was then advanced to the level of occlusion at the left M1 MCA and connected to an aspiration pump. Continuous aspiration was performed for 2 minutes. The guide catheter was connected to a VacLok syringe. The aspiration catheter was subsequently removed under constant aspiration. The guide catheter was aspirated for debris. Then, biplane DSA cerebral angiogram was performed by injecting contrast through the guide catheter which demonstrated complete recanalization of the proximal left MCA and its branches (TICI 3). The MCA and its branches have a normal appearance. No aneurysm, av malformation, dural fistula or vasculitis seen. The venous phase of the cerebral angiogram has a normal appearance with a preferential venous  return into the right transverse, sigmoid sinuses and the into the internal jugular vein. Mild narrowing of the left transverse sinus. Flat panel CT of the head was obtained and post processed in a separate workstation with concurrent attending physician supervision. Selected images were sent to PACS. Flat panel CT of the head has a normal appearance without evidence of a bleed. Right common femoral artery angiogram was obtained in right anterior oblique view. The puncture is at the level of the common femoral artery. The artery has normal caliber, adequate for closure device. The sheath was exchanged over the wire for a Perclose prostyle which was utilized for access closure. Immediate hemostasis was achieved. IMPRESSION: 1. Successful mechanical thrombectomy for treatment of an occlusion of the proximal left M1/MCA. Complete recanalization (TICI 3) of the left MCA was attained. 2. Flat panel CT head was unremarkable. PLAN: 1. Transfer to ICU. 2. Blood pressure goals of 120-140 mm Hg. Electronically Signed   By: Frazier Richards M.D.   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 Patient's last known normal was at 1430 NIH stroke scale of 24, modified Rankin scale of 1.  TNK was not given as the patient was on apixaban. EXAM: ULTRASOUND-GUIDED VASCULAR ACCESS DIAGNOSTIC CEREBRAL ANGIOGRAM MECHANICAL THROMBECTOMY FLAT PANEL HEAD CT COMPARISON:  None Available. MEDICATIONS: Ancef 2 g IV. The antibiotic was administered within 1 hour of the procedure Performing surgeon: Dr. Frazier Richards Assistant: Dr. Karenann Cai ANESTHESIA/SEDATION: General anesthesia CONTRAST:  Twenty-five mL of Omnipaque 300 milligram/mL FLUOROSCOPY: Radiation Exposure Index (as provided by the fluoroscopic device): 703 mGy Kerma COMPLICATIONS: SIR  Level A - No therapy, no consequence. TECHNIQUE: Informed written consent was obtained from the patient's wife after a thorough discussion of the procedural risks, benefits and alternatives. All questions were addressed. Maximal Sterile Barrier Technique was utilized including caps, mask, sterile gowns, sterile gloves, sterile drape, hand hygiene and skin antiseptic. A timeout was performed prior to the initiation of the procedure. The right groin was prepped and draped in the usual sterile fashion. Using a micropuncture kit and the modified Seldinger technique, access was gained to the right common femoral artery and an 8 [place] by 25 cm sheath was placed. Real-time ultrasound guidance was utilized for vascular access including the acquisition of a permanent ultrasound image documenting patency of the accessed vessel. Under fluoroscopy, a Zoom 88 guide catheter was navigated over a 5 [place] VTK catheter and a 0.035" Terumo Glidewire into the aortic arch. The catheter was placed into the left common carotid artery and then advanced into the left internal carotid artery under roadmap guidance. The diagnostic catheter was removed. Frontal and lateral angiograms of the head were obtained. FINDINGS: 1. Normal caliber of the right common femoral artery, adequate for vascular access. 2. Occlusion of a proximal left M1/MCA. 3. Delayed collaterals from the left ACA to the left MCA vascular tree. PROCEDURE: Using biplane roadmap, a zoom 071 aspiration catheter was navigated over an Aristotle 24 microguidewire into the cavernous segment of the left ICA. The aspiration catheter was then advanced to the level of occlusion at the left M1 MCA and connected to an aspiration pump. Continuous aspiration was performed for 2 minutes. The guide catheter was connected to a VacLok syringe. The aspiration catheter was subsequently removed under constant aspiration. The guide catheter was aspirated for debris. Then, biplane DSA cerebral  angiogram was performed by injecting contrast through the guide catheter which demonstrated complete recanalization of the proximal left MCA and its branches (TICI 3). The MCA and its branches have a normal appearance. No aneurysm, av malformation, dural fistula or vasculitis seen. The venous phase of the cerebral angiogram has a normal appearance with a preferential venous return into the right transverse, sigmoid sinuses and the into the internal jugular vein. Mild narrowing of the left transverse sinus. Flat panel CT of the head was obtained and post processed in a separate workstation with concurrent attending physician supervision. Selected images were sent to PACS. Flat panel CT of the head has a normal appearance without evidence of a bleed. Right common femoral artery angiogram was obtained in right anterior oblique view. The puncture is at the level of the common femoral artery. The artery has normal caliber, adequate for closure device. The sheath was exchanged over the wire for a Perclose prostyle which was utilized for access closure. Immediate hemostasis was achieved. IMPRESSION: 1. Successful mechanical thrombectomy for treatment of an occlusion of the proximal left M1/MCA. Complete recanalization (TICI 3) of the left MCA was attained. 2. Flat panel CT head was unremarkable. PLAN: 1. Transfer to ICU. 2. Blood pressure goals of 120-140 mm Hg. Electronically Signed  By: Frazier Richards M.D.   On: 09/16/2021 10:22   DG Abd Portable 1V  Addendum Date: 09/16/2021   ADDENDUM REPORT: 09/16/2021 08:27 ADDENDUM: Study discussed by telephone with Nurse Apolonio Schneiders on 09/16/2021 at 0823 hours. Electronically Signed   By: Genevie Ann M.D.   On: 09/16/2021 08:27   Result Date: 09/16/2021 CLINICAL DATA:  81 year old male with projectile vomiting. EXAM: PORTABLE ABDOMEN - 1 VIEW COMPARISON:  09/15/2021. FINDINGS: Portable AP supine view at 0756 hours. Enteric tube appears pulled back slightly, side hole now at the  expected level of the GEJ. Stable epigastric and right upper quadrant surgical clips. Negative visible lung bases. Increasing gas-filled bowel loops throughout the abdomen and pelvis, superimposed on retained stool in the colon. Possible dilated small bowel now. But gas is present to the rectum. No definite pneumoperitoneum on these supine views. No acute osseous abnormality identified. IMPRESSION: 1. Enteric tube has been pulled back, side hole now at the level of the GEJ. Recommend advancing 6 cm to ensure side hole placement inside the stomach. 2. Increasing gas-filled bowel throughout the abdomen and pelvis, most suggestive of Acute Ileus. Electronically Signed: By: Genevie Ann M.D. On: 09/16/2021 08:18   DG CHEST PORT 1 VIEW  Result Date: 09/16/2021 CLINICAL DATA:  81 year old male with history of chest congestion. EXAM: PORTABLE CHEST 1 VIEW COMPARISON:  Chest x-ray 09/16/2021. FINDINGS: An endotracheal tube is in place with tip 7.4 cm above the carina. A nasogastric tube is seen extending into the stomach, however, the tip of the nasogastric tube extends below the lower margin of the image. Lung volumes are normal. No consolidative airspace disease. No pleural effusions. No pneumothorax. No pulmonary nodule or mass noted. Pulmonary vasculature and the cardiomediastinal silhouette are within normal limits. 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 IMPRESSION: 1. Support apparatus, as above. 2. No radiographic evidence of acute cardiopulmonary disease. 3. Aortic atherosclerosis. Electronically Signed   By: Vinnie Langton M.D.   On: 09/16/2021 08:12   DG Chest Port 1 View  Result Date: 09/16/2021 CLINICAL DATA:  81 year old male with history of stroke. EXAM: PORTABLE CHEST 1 VIEW COMPARISON:  Chest x-ray 09/15/2021. FINDINGS: An endotracheal tube is in place with tip 7.0 cm above the carina. A nasogastric tube is seen  extending into the stomach, however, the tip of the nasogastric tube extends below the lower margin of the image. Lung volumes are normal. No consolidative airspace disease. No pleural effusions. No pneumothorax. No evidence of pulmonary edema. Heart size is mildly enlarged. Upper mediastinal contours are within normal limits. Atherosclerotic calcifications are noted in the thoracic aorta. Status post median sternotomy for aortic valve replacement (a stented bioprosthesis is noted). IMPRESSION: 1. Postoperative changes and support apparatus, as above. 2. No radiographic evidence of acute cardiopulmonary disease. 3. Mild cardiomegaly. 4. Aortic atherosclerosis. Electronically Signed   By: Vinnie Langton M.D.   On: 09/16/2021 05:50   MR ANGIO HEAD WO CONTRAST  Result Date: 09/16/2021 CLINICAL DATA:  81 year old male with paroxysmal atrial fibrillation on Eliquis. New onset aphasia, right side weakness. Code stroke presentation yesterday. Left M1 occlusion by CTA. EXAM: MRA HEAD WITHOUT CONTRAST TECHNIQUE: Angiographic images of the Circle of Willis were acquired using MRA technique without intravenous contrast. COMPARISON:  MRI brain today reported separately. CTA head and neck yesterday. FINDINGS: Anterior circulation: Antegrade flow in both ICA siphons with bilateral siphon irregularity in keeping with the atherosclerosis and stenosis better demonstrated  by CTA. Carotid termini remain patent. And now the left MCA M1 segment, anterior temporal artery origin, and left MCA bifurcation are patent. Left MCA branches appear not significantly changed from yesterday, including a long segment M2 stenosis demonstrated in the posterior division on series 1035, image 4. Right MCA M1 segment is patent but with moderate to severe stenosis upstream of the right MCA bifurcation, concordant with CTA appearance yesterday. Likewise, right MCA branches appear stable with mild irregularity. Anterior communicating artery and  visible ACA branches are stable, mild right A2 stenosis. Posterior circulation: Antegrade flow in the dominant distal left vertebral artery with patent left PICA origin. No distal left vertebral stenosis. Asymmetrically decreased flow in the smaller distal right vertebral artery, which did appear atherosclerotic and stenotic on the CTA yesterday but was patent at that time to the vertebrobasilar junction. Right PICA origin appears to remain patent. Patent basilar artery without stenosis. Patent SCA and PCA origins. Bilateral PCA branches appear stable, within normal limits. Anatomic variants: Dominant left vertebral V4 segment. Other: Brain MRI reported separately today. IMPRESSION: 1. Recanalized Left MCA M1 segment. Patent left MCA bifurcation. Upstream ICA atherosclerosis and stenosis, and downstream MCA M2 branch stenosis appears unchanged from the CTA yesterday. 2. Elsewhere stable intracranial atherosclerosis and stenosis, most notably both ICA siphons, Right MCA bifurcation, distal Right Vertebral Artery. Electronically Signed   By: Genevie Ann M.D.   On: 09/16/2021 05:49   MR BRAIN WO CONTRAST  Result Date: 09/16/2021 CLINICAL DATA:  81 year old male with paroxysmal atrial fibrillation on Eliquis. New onset aphasia, right side weakness. Code stroke presentation yesterday. Left M1 occlusion. EXAM: MRI HEAD WITHOUT CONTRAST TECHNIQUE: Multiplanar, multiecho pulse sequences of the brain and surrounding structures were obtained without intravenous contrast. COMPARISON:  CT head and CTA head and neck yesterday. FINDINGS: Brain: Patchy restricted diffusion throughout the left basal ganglia including the caudate and the lentiform. There is some involvement of the posterior limb of the left external capsule. And there is some mesial temporal lobe involvement on series 5, image 73. But otherwise Insula and other left MCA territory cortex seems spared. No contralateral right hemisphere or posterior fossa restricted  diffusion. Multiple chronic generally small cerebellar infarcts on the right. Patchy and confluent bilateral cerebral white matter T2 and FLAIR hyperintensity with a small area of chronic left middle frontal gyrus encephalomalacia (series 10, image 19). There are several chronic microhemorrhages also in the left frontal lobe (series 22, image 35). No midline shift, mass effect, evidence of mass lesion, ventriculomegaly, extra-axial collection or acute intracranial hemorrhage. Cervicomedullary junction and pituitary are within normal limits. Vascular: Major intracranial vascular flow voids are preserved except for the distal right vertebral artery, which was non dominant but patent by CTA yesterday. Skull and upper cervical spine: Negative. Visualized bone marrow signal is within normal limits. Sinuses/Orbits: Postoperative changes to both globes. Paranasal Visualized paranasal sinuses and mastoids are stable and well aerated. Other: Intubated. Small volume fluid in the pharynx and nasal cavity. Grossly normal visible internal auditory structures. IMPRESSION: 1. Patchy acute infarct in the left basal ganglia and the left mesial temporal lobe with no associated hemorrhage or mass effect. 2. Underlying chronic small and medium-sized vessel ischemia. 3. MRA Head reported separately today. Electronically Signed   By: Genevie Ann M.D.   On: 09/16/2021 05:23   DG Abd Portable 1V  Result Date: 09/15/2021 CLINICAL DATA:  OG tube placement. EXAM: PORTABLE ABDOMEN - 1 VIEW COMPARISON:  None Available. FINDINGS: Tip and side port  of the enteric tube below the diaphragm in the stomach. Enteric chain sutures in the left upper quadrant as well as multiple surgical clips. Surgical clips in the right upper quadrant. No bowel dilatation or evidence of obstruction in the included upper abdomen. IMPRESSION: Tip and side port of the enteric tube below the diaphragm in the stomach. Electronically Signed   By: Keith Rake M.D.   On:  09/15/2021 21:23   DG CHEST PORT 1 VIEW  Result Date: 09/15/2021 CLINICAL DATA:  Intubation. EXAM: PORTABLE CHEST 1 VIEW COMPARISON:  10/02/2020 FINDINGS: Endotracheal tube tip 5.4 cm from the carina at the level of the clavicular heads. Enteric tube in place with tip below the diaphragm not included on this chest field of view. Prior median sternotomy prosthetic aortic valve. Cardiomegaly. Hazy opacity at the left greater than right lung base likely represents combination of atelectasis and pleural effusions. Vascular congestion. No pneumothorax. IMPRESSION: 1. Endotracheal tube tip 5.4 cm from the carina at the level of the clavicular heads. Enteric tube in place with tip below the diaphragm. 2. Cardiomegaly. Vascular congestion. Hazy bibasilar lung opacities likely combination of pleural effusions and atelectasis. Suspect CHF. Electronically Signed   By: Keith Rake M.D.   On: 09/15/2021 21:21   CT ANGIO HEAD NECK W WO CM (CODE STROKE)  Result Date: 09/15/2021 CLINICAL DATA:  Provided history: Neuro deficit, acute, stroke suspected; right-sided weakness and facial droop. EXAM: CT ANGIOGRAPHY HEAD AND NECK TECHNIQUE: Multidetector CT imaging of the head and neck was performed using the standard protocol during bolus administration of intravenous contrast. Multiplanar CT image reconstructions and MIPs were obtained to evaluate the vascular anatomy. Carotid stenosis measurements (when applicable) are obtained utilizing NASCET criteria, using the distal internal carotid diameter as the denominator. RADIATION DOSE REDUCTION: This exam was performed according to the departmental dose-optimization program which includes automated exposure control, adjustment of the mA and/or kV according to patient size and/or use of iterative reconstruction technique. CONTRAST:  20m OMNIPAQUE IOHEXOL 350 MG/ML SOLN COMPARISON:  Noncontrast head CT performed immediately prior 09/15/2021. FINDINGS: CTA NECK FINDINGS Aortic  arch: Standard aortic branching. Atherosclerotic plaque within the visualized aortic arch and proximal major branch vessels of the neck. No hemodynamically significant innominate or proximal subclavian artery stenosis. Right carotid system: CCA and ICA patent within the neck. Mild scattered atherosclerotic plaque within the proximal to mid CCA. Prominent predominantly calcified plaque about the carotid bifurcation and within the proximal ICA. Resultant in 50-60% stenosis at the origin of the right ICA. Left carotid system: CCA and ICA patent within the neck. Mild atherosclerotic plaque scattered within the proximal to mid CCA. Prominent atherosclerotic plaque about the carotid bifurcation and within the proximal ICA. Resultant 40% stenosis of the proximal ICA. Vertebral arteries: Vertebral arteries patent within the neck. The left vertebral artery is dominant. Severe atherosclerotic stenosis at the origin of the right vertebral artery. Mild atherosclerotic narrowing at the origin of the left vertebral artery. Skeleton: Cervical spondylosis. No acute fracture or aggressive osseous lesion. Other neck: No neck mass or cervical lymphadenopathy. Upper chest: Prior median sternotomy. Smooth interlobular septal thickening within the imaged lung apices, likely reflecting interstitial edema. Partially imaged right pleural effusion. Review of the MIP images confirms the above findings CTA HEAD FINDINGS Anterior circulation: The intracranial internal carotid arteries are patent. Atherosclerotic plaque within both vessels. Most notably, there is moderate atherosclerotic narrowing of the distal cavernous/paraclinoid right ICA, and severe stenosis of the distal cavernous/paraclinoid left ICA. The left M1  MCA segment is occluded at its origin. There is some reconstitution of enhancement within M2 and more distal left MCA vessels. The right M1 segment is patent. Atherosclerotic irregularity of the M2 and more distal right MCA  vessels. No appreciable right M2 proximal branch occlusion or high-grade proximal stenosis. The anterior cerebral arteries are patent. Posterior circulation: The intracranial vertebral arteries are patent. Nonstenotic atherosclerotic plaque within the non dominant V4 right vertebral artery. The basilar artery is patent. The posterior cerebral arteries are patent. Atherosclerotic irregularity of both vessels without high-grade proximal stenosis. Posterior communicating arteries are diminutive or absent, bilaterally. Venous sinuses: Within the limitations of contrast timing, no convincing thrombus. Anatomic variants: As described. Review of the MIP images confirms the above findings CTA head impression #1 called by telephone at the time of interpretation on 09/15/2021 at 3:50 pm to provider Isla Pence, who verbally acknowledged these results. IMPRESSION: CTA neck: 1. The common carotid, internal carotid and vertebral arteries are patent within the neck. 2. Atherosclerotic narrowing at the origin of the right ICA of 50-60%. 3. Atherosclerotic narrowing at the origin of the left ICA of 40%. 4. Severe stenosis at the origin of the non-dominant right vertebral artery. 5. Mild atherosclerotic narrowing at the origin of the dominant left vertebral artery. 6.  Aortic Atherosclerosis (ICD10-I70.0). 7. Interstitial edema within the imaged lung apices. 8. Partially imaged right pleural effusion. CTA head: 1. The M1 segment of the left middle cerebral artery is occluded at its origin. There is some reconstitution of enhancement within M2 and more distal left MCA vessels, likely due to collateral flow. 2. 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 3. Severe stenosis of the distal cavernous/paraclinoid left ICA. 4. Moderate stenosis of the distal cavernous/paraclinoid right ICA. Electronically Signed   By: Kellie Simmering D.O.   On: 09/15/2021 16:20   CT HEAD CODE STROKE  WO CONTRAST  Result Date: 09/15/2021 CLINICAL DATA:  Code stroke. Neuro deficit, acute, stroke suspected. Right-sided weakness and facial droop. EXAM: CT HEAD WITHOUT CONTRAST TECHNIQUE: Contiguous axial images were obtained from the base of the skull through the vertex without intravenous contrast. RADIATION DOSE REDUCTION: This exam was performed according to the departmental dose-optimization program which includes automated exposure control, adjustment of the mA and/or kV according to patient size and/or use of iterative reconstruction technique. COMPARISON:  No pertinent prior exams available for comparison. FINDINGS: Brain: Mild generalized cerebral atrophy. Suspected subtle loss of gray-white differentiation within the left insula (for instance as seen on series 2, image 18). Small left MCA territory cortical/subcortical infarct within the left parietal lobe (involving the left postcentral gyrus). This is favored chronic. Background mild patchy and ill-defined hypoattenuation within the cerebral white matter, nonspecific but compatible chronic small vessel ischemic disease. This includes a chronic lacunar infarct within the right centrum semiovale/corona radiata. 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 There is no acute intracranial hemorrhage. No extra-axial fluid collection. No evidence of an intracranial mass. No midline shift. Vascular: Hyperdense M1 left MCA segment, highly suspicious for presence of endoluminal thrombus. Skull: No fracture or aggressive osseous lesion. Sinuses/Orbits: No mass or acute finding within the imaged orbits. Small mucous retention cyst versus polyp within the left maxillary sinus. Minimal mucosal thickening within the right sphenoid sinus and scattered within the bilateral ethmoid sinuses. ASPECTS [facility] Stroke Program Early CT Score) - Ganglionic level infarction (caudate, lentiform nuclei, internal capsule, insula, M1-M3 cortex): 6 -  Supraganglionic infarction (M4-M6 cortex): 3 Total score (  0-10 with 10 being normal): 9 Critical/emergent results were called by telephone at the time of interpretation on 09/15/2021 at 3:45 pm to provider JULIE HAVILAND , who verbally acknowledged these results. IMPRESSION: 1. Hyperdense left M1 MCA segment highly suspicious for the presence of endoluminal thrombus. 2. Suspected subtle acute infarct involving the left insula. ASPECTS is 9. 3. Small left MCA territory cortically-based infarct within the left parietal lobe (with involvement of the postcentral gyrus), favored chronic. 4. Background mild chronic small ischemic changes within the cerebral white matter. This includes a chronic lacunar infarct within the right centrum semiovale/corona radiata. 5. 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 6. Mild generalized parenchymal atrophy. Electronically Signed   By: Kellie Simmering D.O.   On: 09/15/2021 15:50    Labs:  CBC: Recent Labs    09/15/21 1525 09/15/21 1532 09/15/21 1732 09/16/21 0409 09/16/21 1130  WBC 7.8  --   --  11.9*  --   HGB 14.5 14.6 10.9* 14.6 16.0  HCT 43.2 43.0 32.0* 42.9 47.0  PLT 109*  --   --  112*  --     COAGS: Recent Labs    09/15/21 1525  INR 1.1  APTT 31    BMP: Recent Labs    09/15/21 1525 09/15/21 1532 09/15/21 1732 09/16/21 0409 09/16/21 1130  NA 137 136 134* 136 137  K 4.8 4.6 4.1 4.6 3.9  CL 101 99  --  103  --   CO2 27  --   --  23  --   GLUCOSE 113* 104*  --  161*  --   BUN 15 18  --  14  --   CALCIUM 8.6*  --   --  8.9  --   CREATININE 1.54* 1.60*  --  1.39*  --   GFRNONAA 45*  --   --  51*  --     LIVER FUNCTION TESTS: Recent Labs    09/15/21 1525  BILITOT 0.8  AST 34  ALT 28  ALKPHOS 94  PROT 5.9*  ALBUMIN 3.2*    Assessment and Plan:  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   S/P cerebral angiogram with mechanical thrombectomy with complete recanalization of the L MCA (TICI 3).  Continue  care per neurology and CCM.  Ok to remove right groin dressing tomorrow.  Electronically Signed: Murrell Redden, PA-C 09/16/2021, 2:53 PM    I spent a total of 15 Minutes at the the patient's bedside AND on the patient's hospital floor or unit, greater than 50% of which was counseling/coordinating care for f/u cerebral intervention.

## 2021-09-16 NOTE — Progress Notes (Signed)
SLP Cancellation Note  Patient Details Name: Eugene Campbell MRN: [id number] DOB: 1940/10/03   Cancelled treatment:       Reason Eval/Treat Not Completed: Patient not medically ready. Remains on vent this morning - will f/u as able.     Mahala Menghini., M.A. CCC-SLP Acute Rehabilitation Services Office [phone]  Secure chat preferred  09/16/2021, 7:33 AM

## 2021-09-16 NOTE — TOC CAGE-AID Note (Signed)
Transition of Care [facility]) - CAGE-AID Screening   Patient Details  Name: Parker Wherley MRN: [id number] Date of Birth: Mar 16, 1941  Transition of Care [facility]) CM/SW Contact:    Demetrice Amstutz C Tarpley-Carter, LCSWA Phone Number: 09/16/2021, 12:04 PM   Clinical Narrative: Pt is unable to participate in Cage Aid. Pt is intubated and sedated.  CSW will assess at a better time.  Leomia Blake Tarpley-Carter, MSW, LCSW-A Pronouns:  She/Her/Hers [facility] Clinical Social Worker Direct Number:  [phone] [email or web address]  CAGE-AID Screening: Substance Abuse Screening unable to be completed due to: : Patient unable to participate

## 2021-09-16 NOTE — Progress Notes (Signed)
Patient had several large episodes of emesis- Joneen Roach, NP, this RN, and RT at bedside. Zofran ordered, Xray of chest and abdomen ordered, increased sedation ordered by MD, peripheral pressors ordered- IV team aware but unable to insert PIV. Awaiting PICC team- sedation decreased to combat hypotension at this time.

## 2021-09-16 NOTE — Progress Notes (Signed)
RestartSTROKE TEAM PROGRESS NOTE   SUBJECTIVE (INTERVAL HISTORY) His RN is at the bedside.  Pt is still intubated, on ventilation and on sedation.  Per RN, patient this morning vomited large amount, resulted massive aspiration, hypotension and desaturation.  He was heavily sedated for respiratory issues.  On Rocephin.  PICC line placed.   OBJECTIVE Temp:  [93.4 F (34.1 C)-99 F (37.2 C)] 99 F (37.2 C) (06/15 1545) Pulse Rate:  [41-78] 58 (06/15 1830) Resp:  [12-36] 14 (06/15 1830) BP: (92-161)/(45-74) 106/54 (06/15 1800) SpO2:  [88 %-100 %] 98 % (06/15 1830) Arterial Line BP: (84-169)/(42-74) 112/47 (06/15 1830) FiO2 (%):  [40 %-100 %] 60 % (06/15 1507)  Recent Labs  Lab 09/15/21 2326 09/16/21 0406 09/16/21 0809 09/16/21 1149 09/16/21 1600  GLUCAP 228* 171* 206* 140* 98   Recent Labs  Lab 09/15/21 1525 09/15/21 1532 09/15/21 1732 09/15/21 1946 09/16/21 0409 09/16/21 1130  NA 137 136 134*  --  136 137  K 4.8 4.6 4.1  --  4.6 3.9  CL 101 99  --   --  103  --   CO2 27  --   --   --  23  --   GLUCOSE 113* 104*  --   --  161*  --   BUN 15 18  --   --  14  --   CREATININE 1.54* 1.60*  --   --  1.39*  --   CALCIUM 8.6*  --   --   --  8.9  --   MG  --   --   --  1.9  --   --   PHOS  --   --   --  2.2*  --   --    Recent Labs  Lab 09/15/21 1525  AST 34  ALT 28  ALKPHOS 94  BILITOT 0.8  PROT 5.9*  ALBUMIN 3.2*   Recent Labs  Lab 09/15/21 1525 09/15/21 1532 09/15/21 1732 09/16/21 0409 09/16/21 1130  WBC 7.8  --   --  11.9*  --   NEUTROABS 4.2  --   --   --   --   HGB 14.5 14.6 10.9* 14.6 16.0  HCT 43.2 43.0 32.0* 42.9 47.0  MCV 98.6  --   --  95.5  --   PLT 109*  --   --  112*  --    No results for input(s): "CKTOTAL", "CKMB", "CKMBINDEX", "TROPONINI" in the last 168 hours. Recent Labs    09/15/21 1525  LABPROT 14.2  INR 1.1   Recent Labs    09/16/21 0251  COLORURINE YELLOW  LABSPEC 1.030  PHURINE 5.0  GLUCOSEU >=500*  HGBUR NEGATIVE   BILIRUBINUR NEGATIVE  KETONESUR NEGATIVE  PROTEINUR NEGATIVE  NITRITE NEGATIVE  LEUKOCYTESUR NEGATIVE       Component Value Date/Time   CHOL 133 09/16/2021 0409   TRIG 38 09/16/2021 0410   HDL 59 09/16/2021 0409   CHOLHDL 2.3 09/16/2021 0409   VLDL 8 09/16/2021 0409   LDLCALC 66 09/16/2021 0409   Lab Results  Component Value Date   HGBA1C 5.9 (H) 09/15/2021   No results found for: "LABOPIA", "COCAINSCRNUR", "LABBENZ", "AMPHETMU", "THCU", "LABBARB"  Recent Labs  Lab 09/15/21 1946  ETH <10    I have personally reviewed the radiological images below and agree with the radiology interpretations.  ECHOCARDIOGRAM COMPLETE  Result Date: 09/16/2021    ECHOCARDIOGRAM REPORT   Patient Name:   Eugene Campbell Date of Exam:  09/16/2021 Medical Rec #:  [id number]      Height:       73.0 in Accession #:    [id number]     Weight:       165.8 lb Date of Birth:  [birth year]      BSA:          1.987 m Patient Age:    81 years       BP:           102/60 mmHg Patient Gender: M              HR:           63 bpm. Exam Location:  Inpatient Procedure: 2D Echo, Cardiac Doppler and Color Doppler Indications:    Stroke  History:        Patient has no prior history of Echocardiogram examinations.                 Aortic Valve Disease, Arrythmias:Atrial Fibrillation; Risk                 Factors:Former Smoker and Diabetes. AVR. Acute respiratory                 failure with hypoxia. HFrEF.                 Aortic Valve: valve is present in the aortic position. Procedure                 Date: unknown.  Sonographer:    Clayton Lefort RDCS (AE) Referring Phys: [id number] [place]  Sonographer Comments: Echo performed with patient supine and on artificial respirator. Unable to use Definity due to lack of line access per nurse. IMPRESSIONS  1. Left ventricular ejection fraction, by estimation, is 20 to 25%. The left ventricle has severely decreased function. The left ventricle demonstrates global hypokinesis. Left  ventricular diastolic parameters are consistent with Grade I diastolic dysfunction (impaired relaxation).  2. Right ventricular systolic function is mildly reduced. The right ventricular size is mildly enlarged.  3. Left atrial size was mildly dilated.  4. Right atrial size was severely dilated.  5. The mitral valve is normal in structure. No evidence of mitral valve regurgitation. No evidence of mitral stenosis.  6. 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 The aortic valve has been repaired/replaced. Aortic valve regurgitation is not visualized. No aortic stenosis is present. There is a valve present in the aortic position. Procedure Date: unknown. Aortic valve mean gradient measures 8.0 mmHg. Aortic valve Vmax measures 2.00 m/s.  7. The inferior vena cava is normal in size with greater than 50% respiratory variability, suggesting right atrial pressure of 3 mmHg. FINDINGS  Left Ventricle: Left ventricular ejection fraction, by estimation, is 20 to 25%. The left ventricle has severely decreased function. The left ventricle demonstrates global hypokinesis. The left ventricular internal cavity size was normal in size. There is no left ventricular hypertrophy. Left ventricular diastolic parameters are consistent with Grade I diastolic dysfunction (impaired relaxation). Right Ventricle: The right ventricular size is mildly enlarged. No increase in right ventricular wall thickness. Right ventricular systolic function is mildly reduced. Left Atrium: Left atrial size was mildly dilated. Right Atrium: Right atrial size was severely dilated. Pericardium: There is no evidence of pericardial effusion. Mitral Valve: The mitral valve is normal in structure. Mild to moderate mitral annular calcification. No evidence of mitral valve regurgitation. No evidence of mitral valve stenosis. Tricuspid Valve:  The tricuspid valve is normal in structure. Tricuspid valve regurgitation is not demonstrated.  No evidence of tricuspid stenosis. 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 The aortic valve has been repaired/replaced. Aortic valve regurgitation is not visualized. No aortic stenosis is present. Aortic valve mean gradient measures 8.0 mmHg. Aortic valve peak gradient measures 16.0 mmHg. Aortic valve area, by VTI measures 1.10 cm. There is a valve present in the aortic position. Procedure Date: unknown. Pulmonic Valve: The pulmonic valve was not well visualized. Pulmonic valve regurgitation is trivial. No evidence of pulmonic stenosis. Aorta: The aortic root is normal in size and structure. Venous: The inferior vena cava is normal in size with greater than 50% respiratory variability, suggesting right atrial pressure of 3 mmHg. IAS/Shunts: No atrial level shunt detected by color flow Doppler.  LEFT VENTRICLE PLAX 2D LVIDd:         5.30 cm      Diastology LVIDs:         4.90 cm      LV e' medial:    4.13 cm/s LV PW:         1.20 cm      LV E/e' medial:  19.8 LV IVS:        2.00 cm      LV e' lateral:   9.14 cm/s LVOT diam:     2.00 cm      LV E/e' lateral: 8.9 LV SV:         38 LV SV Index:   19 LVOT Area:     3.14 cm  LV Volumes (MOD) LV vol d, MOD A2C: 158.0 ml LV vol d, MOD A4C: 170.0 ml LV vol s, MOD A2C: 94.3 ml LV vol s, MOD A4C: 139.0 ml LV SV MOD A2C:     63.7 ml LV SV MOD A4C:     170.0 ml LV SV MOD BP:      53.6 ml RIGHT VENTRICLE RV Basal diam:  3.20 cm RV S prime:     6.74 cm/s TAPSE (M-mode): 0.8 cm LEFT ATRIUM             Index        RIGHT ATRIUM           Index LA diam:        3.90 cm 1.96 cm/m   RA Area:     25.90 cm LA Vol (A2C):   66.4 ml 33.42 ml/m  RA Volume:   85.90 ml  43.24 ml/m LA Vol (A4C):   56.6 ml 28.49 ml/m LA Biplane Vol: 66.5 ml 33.47 ml/m  AORTIC VALVE AV Area (Vmax):    1.20 cm AV Area (Vmean):   1.25 cm AV Area (VTI):     1.10 cm AV Vmax:           200.00 cm/s AV Vmean:          131.000 cm/s AV VTI:            0.349 m AV  Peak Grad:      16.0 mmHg AV Mean Grad:      8.0 mmHg LVOT Vmax:         76.40 cm/s LVOT Vmean:        52.300 cm/s LVOT VTI:          0.122 m LVOT/AV VTI ratio: 0.35  AORTA Ao Root diam: 2.70 cm Ao Asc diam:  4.00 cm MITRAL VALVE MV  Area (PHT): 4.06 cm    SHUNTS MV Decel Time: 187 msec    Systemic VTI:  0.12 m MV E velocity: 81.80 cm/s  Systemic Diam: 2.00 cm Eugene Tobb DO Electronically signed by Berniece Salines DO Signature Date/Time: 09/16/2021/5:54:30 PM    Final    DG CHEST PORT 1 VIEW  Result Date: 09/16/2021 CLINICAL DATA:  PICC line placement. EXAM: PORTABLE CHEST 1 VIEW COMPARISON:  Chest x-ray 09/16/2021 FINDINGS: Endotracheal tube tip is 5.2 cm above the carina. Enteric tube extends below the diaphragm. There is a new right upper extremity PICC terminating in the SVC. Patient is status post cardiac surgery and valve replacement, unchanged. Cardiac silhouette is enlarged. There is some increasing retrocardiac opacities. No pleural effusion or pneumothorax identified. Osseous structures are stable. IMPRESSION: 1. Right upper extremity PICC terminates in the SVC. 2. Increasing retrocardiac atelectasis/airspace disease. 3. Stable cardiomegaly. Electronically Signed   By: Ronney Asters M.D.   On: 09/16/2021 15:30   [place] EKG SITE RITE  Result Date: 09/16/2021 If Site Rite image not attached, placement could not be confirmed due to current cardiac rhythm.  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 Patient's last known normal was at 1430 NIH stroke scale of 24, modified Rankin scale of 1. TNK was not given as the patient was on apixaban. EXAM: ULTRASOUND-GUIDED VASCULAR ACCESS DIAGNOSTIC CEREBRAL ANGIOGRAM MECHANICAL THROMBECTOMY FLAT PANEL HEAD CT COMPARISON:  None Available.  MEDICATIONS: Ancef 2 g IV. The antibiotic was administered within 1 hour of the procedure Performing surgeon: Dr. Frazier Richards Assistant: Dr. Karenann Cai ANESTHESIA/SEDATION: General anesthesia CONTRAST:  Twenty-five mL of Omnipaque 300 milligram/mL FLUOROSCOPY: Radiation Exposure Index (as provided by the fluoroscopic device): 219 mGy Kerma COMPLICATIONS: SIR Level A - No therapy, no consequence. TECHNIQUE: Informed written consent was obtained from the patient's wife after a thorough discussion of the procedural risks, benefits and alternatives. All questions were addressed. Maximal Sterile Barrier Technique was utilized including caps, mask, sterile gowns, sterile gloves, sterile drape, hand hygiene and skin antiseptic. A timeout was performed prior to the initiation of the procedure. The right groin was prepped and draped in the usual sterile fashion. Using a micropuncture kit and the modified Seldinger technique, access was gained to the right common femoral artery and an 8 [place] by 25 cm sheath was placed. Real-time ultrasound guidance was utilized for vascular access including the acquisition of a permanent ultrasound image documenting patency of the accessed vessel. Under fluoroscopy, a Zoom 88 guide catheter was navigated over a 5 [place] VTK catheter and a 0.035" Terumo Glidewire into the aortic arch. The catheter was placed into the left common carotid artery and then advanced into the left internal carotid artery under roadmap guidance. The diagnostic catheter was removed. Frontal and lateral angiograms of the head were obtained. FINDINGS: 1. Normal caliber of the right common femoral artery, adequate for vascular access. 2. Occlusion of a proximal left M1/MCA. 3. Delayed collaterals from the left ACA to the left MCA vascular tree. PROCEDURE: Using biplane roadmap, a zoom 071 aspiration catheter was navigated over an Aristotle 24 microguidewire into the cavernous segment of the left ICA. The  aspiration catheter was then advanced to the level of occlusion at the left M1 MCA and connected to an aspiration pump. Continuous aspiration was performed for 2  minutes. The guide catheter was connected to a VacLok syringe. The aspiration catheter was subsequently removed under constant aspiration. The guide catheter was aspirated for debris. Then, biplane DSA cerebral angiogram was performed by injecting contrast through the guide catheter which demonstrated complete recanalization of the proximal left MCA and its branches (TICI 3). The MCA and its branches have a normal appearance. No aneurysm, av malformation, dural fistula or vasculitis seen. The venous phase of the cerebral angiogram has a normal appearance with a preferential venous return into the right transverse, sigmoid sinuses and the into the internal jugular vein. Mild narrowing of the left transverse sinus. Flat panel CT of the head was obtained and post processed in a separate workstation with concurrent attending physician supervision. Selected images were sent to PACS. Flat panel CT of the head has a normal appearance without evidence of a bleed. Right common femoral artery angiogram was obtained in right anterior oblique view. The puncture is at the level of the common femoral artery. The artery has normal caliber, adequate for closure device. The sheath was exchanged over the wire for a Perclose prostyle which was utilized for access closure. Immediate hemostasis was achieved. IMPRESSION: 1. Successful mechanical thrombectomy for treatment of an occlusion of the proximal left M1/MCA. Complete recanalization (TICI 3) of the left MCA was attained. 2. Flat panel CT head was unremarkable. PLAN: 1. Transfer to ICU. 2. Blood pressure goals of 120-140 mm Hg. Electronically Signed   By: Frazier Richards M.D.   On: 09/16/2021 10:22   IR US Guide Vasc Access Right  Result Date: 09/16/2021 INDICATION: 81 year male patient with left MCA syndrome, due to  occlusion of the left M1 segment right-sided paralysis, brought for mechanical thrombectomy of the left MCA. Patient's last known normal was at 1430 NIH stroke scale of 24, modified Rankin scale of 1. TNK was not given as the patient was on apixaban. EXAM: ULTRASOUND-GUIDED VASCULAR ACCESS DIAGNOSTIC CEREBRAL ANGIOGRAM MECHANICAL THROMBECTOMY FLAT PANEL HEAD CT COMPARISON:  None Available. MEDICATIONS: Ancef 2 g IV. The antibiotic was administered within 1 hour of the procedure Performing surgeon: Dr. Frazier Richards Assistant: Dr. Karenann Cai ANESTHESIA/SEDATION: General anesthesia CONTRAST:  Twenty-five mL of Omnipaque 300 milligram/mL FLUOROSCOPY: Radiation Exposure Index (as provided by the fluoroscopic device): 270 mGy Kerma COMPLICATIONS: SIR Level A - No therapy, no consequence. TECHNIQUE: Informed written consent was obtained from the patient's wife after a thorough discussion of the procedural risks, benefits and alternatives. All questions were addressed. Maximal Sterile Barrier Technique was utilized including caps, mask, sterile gowns, sterile gloves, sterile drape, hand hygiene and skin antiseptic. A timeout was performed prior to the initiation of the procedure. The right groin was prepped and draped in the usual sterile fashion. Using a micropuncture kit and the modified Seldinger technique, access was gained to the right common femoral artery and an 8 [place] by 25 cm sheath was placed. Real-time ultrasound guidance was utilized for vascular access including the acquisition of a permanent ultrasound image documenting patency of the accessed vessel. Under fluoroscopy, a Zoom 88 guide catheter was navigated over a 5 [place] VTK catheter and a 0.035" Terumo Glidewire into the aortic arch. The catheter was placed into the left common carotid artery and then advanced into the left internal carotid artery under roadmap guidance. The diagnostic catheter was removed. Frontal and lateral angiograms of the  head were obtained. FINDINGS: 1. Normal caliber of the right common femoral artery, adequate for vascular access. 2. Occlusion of a  proximal left M1/MCA. 3. Delayed collaterals from the left ACA to the left MCA vascular tree. PROCEDURE: Using biplane roadmap, a zoom 071 aspiration catheter was navigated over an Aristotle 24 microguidewire into the cavernous segment of the left ICA. The aspiration catheter was then advanced to the level of occlusion at the left M1 MCA and connected to an aspiration pump. Continuous aspiration was performed for 2 minutes. The guide catheter was connected to a VacLok syringe. The aspiration catheter was subsequently removed under constant aspiration. The guide catheter was aspirated for debris. Then, biplane DSA cerebral angiogram was performed by injecting contrast through the guide catheter which demonstrated complete recanalization of the proximal left MCA and its branches (TICI 3). The MCA and its branches have a normal appearance. No aneurysm, av malformation, dural fistula or vasculitis seen. The venous phase of the cerebral angiogram has a normal appearance with a preferential venous return into the right transverse, sigmoid sinuses and the into the internal jugular vein. Mild narrowing of the left transverse sinus. Flat panel CT of the head was obtained and post processed in a separate workstation with concurrent attending physician supervision. Selected images were sent to PACS. Flat panel CT of the head has a normal appearance without evidence of a bleed. Right common femoral artery angiogram was obtained in right anterior oblique view. The puncture is at the level of the common femoral artery. The artery has normal caliber, adequate for closure device. The sheath was exchanged over the wire for a Perclose prostyle which was utilized for access closure. Immediate hemostasis was achieved. IMPRESSION: 1. Successful mechanical thrombectomy for treatment of an occlusion of the  proximal left M1/MCA. Complete recanalization (TICI 3) of the left MCA was attained. 2. Flat panel CT head was unremarkable. PLAN: 1. Transfer to ICU. 2. Blood pressure goals of 120-140 mm Hg. Electronically Signed   By: Frazier Richards M.D.   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 Patient's last known normal was at 1430 NIH stroke scale of 24, modified Rankin scale of 1. TNK was not given as the patient was on apixaban. EXAM: ULTRASOUND-GUIDED VASCULAR ACCESS DIAGNOSTIC CEREBRAL ANGIOGRAM MECHANICAL THROMBECTOMY FLAT PANEL HEAD CT COMPARISON:  None Available. MEDICATIONS: Ancef 2 g IV. The antibiotic was administered within 1 hour of the procedure Performing surgeon: Dr. Frazier Richards Assistant: Dr. Karenann Cai ANESTHESIA/SEDATION: General anesthesia CONTRAST:  Twenty-five mL of Omnipaque 300 milligram/mL FLUOROSCOPY: Radiation Exposure Index (as provided by the fluoroscopic device): 528 mGy Kerma COMPLICATIONS: SIR Level A - No therapy, no consequence. TECHNIQUE: Informed written consent was obtained from the patient's wife after a thorough discussion of the procedural risks, benefits and alternatives. All questions were addressed. Maximal Sterile Barrier Technique was utilized including caps, mask, sterile gowns, sterile gloves, sterile drape, hand hygiene and skin antiseptic. A timeout was performed prior to the initiation of the procedure. The right groin was prepped and draped in the usual sterile fashion. Using a micropuncture kit and the modified Seldinger technique, access was gained to the right common femoral artery and an 8 [place] by 25 cm sheath was placed. Real-time ultrasound guidance was utilized for vascular access including the acquisition of a permanent ultrasound image documenting patency  of the accessed vessel. Under fluoroscopy, a Zoom 88 guide catheter was navigated over a 5  [place] VTK catheter and a 0.035" Terumo Glidewire into the aortic arch. The catheter was placed into the left common carotid artery and then advanced into the left internal carotid artery under roadmap guidance. The diagnostic catheter was removed. Frontal and lateral angiograms of the head were obtained. FINDINGS: 1. Normal caliber of the right common femoral artery, adequate for vascular access. 2. Occlusion of a proximal left M1/MCA. 3. Delayed collaterals from the left ACA to the left MCA vascular tree. PROCEDURE: Using biplane roadmap, a zoom 071 aspiration catheter was navigated over an Aristotle 24 microguidewire into the cavernous segment of the left ICA. The aspiration catheter was then advanced to the level of occlusion at the left M1 MCA and connected to an aspiration pump. Continuous aspiration was performed for 2 minutes. The guide catheter was connected to a VacLok syringe. The aspiration catheter was subsequently removed under constant aspiration. The guide catheter was aspirated for debris. Then, biplane DSA cerebral angiogram was performed by injecting contrast through the guide catheter which demonstrated complete recanalization of the proximal left MCA and its branches (TICI 3). The MCA and its branches have a normal appearance. No aneurysm, av malformation, dural fistula or vasculitis seen. The venous phase of the cerebral angiogram has a normal appearance with a preferential venous return into the right transverse, sigmoid sinuses and the into the internal jugular vein. Mild narrowing of the left transverse sinus. Flat panel CT of the head was obtained and post processed in a separate workstation with concurrent attending physician supervision. Selected images were sent to PACS. Flat panel CT of the head has a normal appearance without evidence of a bleed. Right common femoral artery angiogram was obtained  in right anterior oblique view. The puncture is at the level of the common femoral artery. The artery has normal caliber, adequate for closure device. The sheath was exchanged over the wire for a Perclose prostyle which was utilized for access closure. Immediate hemostasis was achieved. IMPRESSION: 1. Successful mechanical thrombectomy for treatment of an occlusion of the proximal left M1/MCA. Complete recanalization (TICI 3) of the left MCA was attained. 2. Flat panel CT head was unremarkable. PLAN: 1. Transfer to ICU. 2. Blood pressure goals of 120-140 mm Hg. Electronically Signed   By: Frazier Richards M.D.   On: 09/16/2021 10:22   DG Abd Portable 1V  Addendum Date: 09/16/2021   ADDENDUM REPORT: 09/16/2021 08:27 ADDENDUM: Study discussed by telephone with Nurse Apolonio Schneiders on 09/16/2021 at 0823 hours. Electronically Signed   By: Genevie Ann M.D.   On: 09/16/2021 08:27   Result Date: 09/16/2021 CLINICAL DATA:  81 year old male with projectile vomiting. EXAM: PORTABLE ABDOMEN - 1 VIEW COMPARISON:  09/15/2021. FINDINGS: Portable AP supine view at 0756 hours. Enteric tube appears pulled back slightly, side hole now at the expected level of the GEJ. Stable epigastric and right upper quadrant surgical clips. Negative visible lung bases. Increasing gas-filled bowel loops throughout the abdomen and pelvis, superimposed on retained stool in the colon. Possible dilated small bowel now. But gas is present to the rectum. No definite pneumoperitoneum on these supine views. No acute osseous abnormality identified. IMPRESSION: 1. Enteric tube has been pulled back, side hole now at the level of the GEJ. Recommend advancing 6 cm to ensure side hole placement inside the stomach. 2. Increasing gas-filled bowel throughout the abdomen and pelvis, most suggestive of Acute Ileus. Electronically Signed: By: Genevie Ann M.D. On: 09/16/2021 08:18   DG CHEST PORT 1 VIEW  Result Date:  09/16/2021 CLINICAL DATA:  81 year old male with history of  chest congestion. EXAM: PORTABLE CHEST 1 VIEW COMPARISON:  Chest x-ray 09/16/2021. FINDINGS: An endotracheal tube is in place with tip 7.4 cm above the carina. A nasogastric tube is seen extending into the stomach, however, the tip of the nasogastric tube extends below the lower margin of the image. Lung volumes are normal. No consolidative airspace disease. No pleural effusions. No pneumothorax. No pulmonary nodule or mass noted. Pulmonary vasculature and the cardiomediastinal silhouette are within normal limits. 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 IMPRESSION: 1. Support apparatus, as above. 2. No radiographic evidence of acute cardiopulmonary disease. 3. Aortic atherosclerosis. Electronically Signed   By: Vinnie Langton M.D.   On: 09/16/2021 08:12   DG Chest Port 1 View  Result Date: 09/16/2021 CLINICAL DATA:  81 year old male with history of stroke. EXAM: PORTABLE CHEST 1 VIEW COMPARISON:  Chest x-ray 09/15/2021. FINDINGS: An endotracheal tube is in place with tip 7.0 cm above the carina. A nasogastric tube is seen extending into the stomach, however, the tip of the nasogastric tube extends below the lower margin of the image. Lung volumes are normal. No consolidative airspace disease. No pleural effusions. No pneumothorax. No evidence of pulmonary edema. Heart size is mildly enlarged. Upper mediastinal contours are within normal limits. Atherosclerotic calcifications are noted in the thoracic aorta. Status post median sternotomy for aortic valve replacement (a stented bioprosthesis is noted). IMPRESSION: 1. Postoperative changes and support apparatus, as above. 2. No radiographic evidence of acute cardiopulmonary disease. 3. Mild cardiomegaly. 4. Aortic atherosclerosis. Electronically Signed   By: Vinnie Langton M.D.   On: 09/16/2021 05:50   MR ANGIO HEAD WO CONTRAST  Result Date: 09/16/2021 CLINICAL DATA:   81 year old male with paroxysmal atrial fibrillation on Eliquis. New onset aphasia, right side weakness. Code stroke presentation yesterday. Left M1 occlusion by CTA. EXAM: MRA HEAD WITHOUT CONTRAST TECHNIQUE: Angiographic images of the Circle of Willis were acquired using MRA technique without intravenous contrast. COMPARISON:  MRI brain today reported separately. CTA head and neck yesterday. FINDINGS: Anterior circulation: Antegrade flow in both ICA siphons with bilateral siphon irregularity in keeping with the atherosclerosis and stenosis better demonstrated by CTA. Carotid termini remain patent. And now the left MCA M1 segment, anterior temporal artery origin, and left MCA bifurcation are patent. Left MCA branches appear not significantly changed from yesterday, including a long segment M2 stenosis demonstrated in the posterior division on series 1035, image 4. Right MCA M1 segment is patent but with moderate to severe stenosis upstream of the right MCA bifurcation, concordant with CTA appearance yesterday. Likewise, right MCA branches appear stable with mild irregularity. Anterior communicating artery and visible ACA branches are stable, mild right A2 stenosis. Posterior circulation: Antegrade flow in the dominant distal left vertebral artery with patent left PICA origin. No distal left vertebral stenosis. Asymmetrically decreased flow in the smaller distal right vertebral artery, which did appear atherosclerotic and stenotic on the CTA yesterday but was patent at that time to the vertebrobasilar junction. Right PICA origin appears to remain patent. Patent basilar artery without stenosis. Patent SCA and PCA origins. Bilateral PCA branches appear stable, within normal limits. Anatomic variants: Dominant left vertebral V4 segment. Other: Brain MRI reported separately today. IMPRESSION: 1. Recanalized Left MCA M1 segment. Patent left MCA bifurcation. Upstream ICA atherosclerosis and stenosis, and downstream MCA M2  branch stenosis appears unchanged from the CTA yesterday. 2. Elsewhere stable  intracranial atherosclerosis and stenosis, most notably both ICA siphons, Right MCA bifurcation, distal Right Vertebral Artery. Electronically Signed   By: Genevie Ann M.D.   On: 09/16/2021 05:49   MR BRAIN WO CONTRAST  Result Date: 09/16/2021 CLINICAL DATA:  81 year old male with paroxysmal atrial fibrillation on Eliquis. New onset aphasia, right side weakness. Code stroke presentation yesterday. Left M1 occlusion. EXAM: MRI HEAD WITHOUT CONTRAST TECHNIQUE: Multiplanar, multiecho pulse sequences of the brain and surrounding structures were obtained without intravenous contrast. COMPARISON:  CT head and CTA head and neck yesterday. FINDINGS: Brain: Patchy restricted diffusion throughout the left basal ganglia including the caudate and the lentiform. There is some involvement of the posterior limb of the left external capsule. And there is some mesial temporal lobe involvement on series 5, image 73. But otherwise Insula and other left MCA territory cortex seems spared. No contralateral right hemisphere or posterior fossa restricted diffusion. Multiple chronic generally small cerebellar infarcts on the right. Patchy and confluent bilateral cerebral white matter T2 and FLAIR hyperintensity with a small area of chronic left middle frontal gyrus encephalomalacia (series 10, image 19). There are several chronic microhemorrhages also in the left frontal lobe (series 22, image 35). No midline shift, mass effect, evidence of mass lesion, ventriculomegaly, extra-axial collection or acute intracranial hemorrhage. Cervicomedullary junction and pituitary are within normal limits. Vascular: Major intracranial vascular flow voids are preserved except for the distal right vertebral artery, which was non dominant but patent by CTA yesterday. Skull and upper cervical spine: Negative. Visualized bone marrow signal is within normal limits. Sinuses/Orbits:  Postoperative changes to both globes. Paranasal Visualized paranasal sinuses and mastoids are stable and well aerated. Other: Intubated. Small volume fluid in the pharynx and nasal cavity. Grossly normal visible internal auditory structures. IMPRESSION: 1. Patchy acute infarct in the left basal ganglia and the left mesial temporal lobe with no associated hemorrhage or mass effect. 2. Underlying chronic small and medium-sized vessel ischemia. 3. MRA Head reported separately today. Electronically Signed   By: Genevie Ann M.D.   On: 09/16/2021 05:23   DG Abd Portable 1V  Result Date: 09/15/2021 CLINICAL DATA:  OG tube placement. EXAM: PORTABLE ABDOMEN - 1 VIEW COMPARISON:  None Available. FINDINGS: Tip and side port of the enteric tube below the diaphragm in the stomach. Enteric chain sutures in the left upper quadrant as well as multiple surgical clips. Surgical clips in the right upper quadrant. No bowel dilatation or evidence of obstruction in the included upper abdomen. IMPRESSION: Tip and side port of the enteric tube below the diaphragm in the stomach. Electronically Signed   By: Keith Rake M.D.   On: 09/15/2021 21:23   DG CHEST PORT 1 VIEW  Result Date: 09/15/2021 CLINICAL DATA:  Intubation. EXAM: PORTABLE CHEST 1 VIEW COMPARISON:  10/02/2020 FINDINGS: Endotracheal tube tip 5.4 cm from the carina at the level of the clavicular heads. Enteric tube in place with tip below the diaphragm not included on this chest field of view. Prior median sternotomy prosthetic aortic valve. Cardiomegaly. Hazy opacity at the left greater than right lung base likely represents combination of atelectasis and pleural effusions. Vascular congestion. No pneumothorax. IMPRESSION: 1. Endotracheal tube tip 5.4 cm from the carina at the level of the clavicular heads. Enteric tube in place with tip below the diaphragm. 2. Cardiomegaly. Vascular congestion. Hazy bibasilar lung opacities likely combination of pleural effusions and  atelectasis. Suspect CHF. Electronically Signed   By: Keith Rake M.D.   On: 09/15/2021  21:21   CT ANGIO HEAD NECK W WO CM (CODE STROKE)  Result Date: 09/15/2021 CLINICAL DATA:  Provided history: Neuro deficit, acute, stroke suspected; right-sided weakness and facial droop. EXAM: CT ANGIOGRAPHY HEAD AND NECK TECHNIQUE: Multidetector CT imaging of the head and neck was performed using the standard protocol during bolus administration of intravenous contrast. Multiplanar CT image reconstructions and MIPs were obtained to evaluate the vascular anatomy. Carotid stenosis measurements (when applicable) are obtained utilizing NASCET criteria, using the distal internal carotid diameter as the denominator. RADIATION DOSE REDUCTION: This exam was performed according to the departmental dose-optimization program which includes automated exposure control, adjustment of the mA and/or kV according to patient size and/or use of iterative reconstruction technique. CONTRAST:  39m OMNIPAQUE IOHEXOL 350 MG/ML SOLN COMPARISON:  Noncontrast head CT performed immediately prior 09/15/2021. FINDINGS: CTA NECK FINDINGS Aortic arch: Standard aortic branching. Atherosclerotic plaque within the visualized aortic arch and proximal major branch vessels of the neck. No hemodynamically significant innominate or proximal subclavian artery stenosis. Right carotid system: CCA and ICA patent within the neck. Mild scattered atherosclerotic plaque within the proximal to mid CCA. Prominent predominantly calcified plaque about the carotid bifurcation and within the proximal ICA. Resultant in 50-60% stenosis at the origin of the right ICA. Left carotid system: CCA and ICA patent within the neck. Mild atherosclerotic plaque scattered within the proximal to mid CCA. Prominent atherosclerotic plaque about the carotid bifurcation and within the proximal ICA. Resultant 40% stenosis of the proximal ICA. Vertebral arteries: Vertebral arteries patent  within the neck. The left vertebral artery is dominant. Severe atherosclerotic stenosis at the origin of the right vertebral artery. Mild atherosclerotic narrowing at the origin of the left vertebral artery. Skeleton: Cervical spondylosis. No acute fracture or aggressive osseous lesion. Other neck: No neck mass or cervical lymphadenopathy. Upper chest: Prior median sternotomy. Smooth interlobular septal thickening within the imaged lung apices, likely reflecting interstitial edema. Partially imaged right pleural effusion. Review of the MIP images confirms the above findings CTA HEAD FINDINGS Anterior circulation: The intracranial internal carotid arteries are patent. Atherosclerotic plaque within both vessels. Most notably, there is moderate atherosclerotic narrowing of the distal cavernous/paraclinoid right ICA, and severe stenosis of the distal cavernous/paraclinoid left ICA. The left M1 MCA segment is occluded at its origin. There is some reconstitution of enhancement within M2 and more distal left MCA vessels. The right M1 segment is patent. Atherosclerotic irregularity of the M2 and more distal right MCA vessels. No appreciable right M2 proximal branch occlusion or high-grade proximal stenosis. The anterior cerebral arteries are patent. Posterior circulation: The intracranial vertebral arteries are patent. Nonstenotic atherosclerotic plaque within the non dominant V4 right vertebral artery. The basilar artery is patent. The posterior cerebral arteries are patent. Atherosclerotic irregularity of both vessels without high-grade proximal stenosis. Posterior communicating arteries are diminutive or absent, bilaterally. Venous sinuses: Within the limitations of contrast timing, no convincing thrombus. Anatomic variants: As described. Review of the MIP images confirms the above findings CTA head impression #1 called by telephone at the time of interpretation on 09/15/2021 at 3:50 pm to provider HIsla Pence who  verbally acknowledged these results. IMPRESSION: CTA neck: 1. The common carotid, internal carotid and vertebral arteries are patent within the neck. 2. Atherosclerotic narrowing at the origin of the right ICA of 50-60%. 3. Atherosclerotic narrowing at the origin of the left ICA of 40%. 4. Severe stenosis at the origin of the non-dominant right vertebral artery. 5. Mild atherosclerotic narrowing at the origin of  the dominant left vertebral artery. 6.  Aortic Atherosclerosis (ICD10-I70.0). 7. Interstitial edema within the imaged lung apices. 8. Partially imaged right pleural effusion. CTA head: 1. The M1 segment of the left middle cerebral artery is occluded at its origin. There is some reconstitution of enhancement within M2 and more distal left MCA vessels, likely due to collateral flow. 2. 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 3. Severe stenosis of the distal cavernous/paraclinoid left ICA. 4. Moderate stenosis of the distal cavernous/paraclinoid right ICA. Electronically Signed   By: Kellie Simmering D.O.   On: 09/15/2021 16:20   CT HEAD CODE STROKE WO CONTRAST  Result Date: 09/15/2021 CLINICAL DATA:  Code stroke. Neuro deficit, acute, stroke suspected. Right-sided weakness and facial droop. EXAM: CT HEAD WITHOUT CONTRAST TECHNIQUE: Contiguous axial images were obtained from the base of the skull through the vertex without intravenous contrast. RADIATION DOSE REDUCTION: This exam was performed according to the departmental dose-optimization program which includes automated exposure control, adjustment of the mA and/or kV according to patient size and/or use of iterative reconstruction technique. COMPARISON:  No pertinent prior exams available for comparison. FINDINGS: Brain: Mild generalized cerebral atrophy. Suspected subtle loss of gray-white differentiation within the left insula (for instance as seen on series 2, image 18). Small left MCA territory  cortical/subcortical infarct within the left parietal lobe (involving the left postcentral gyrus). This is favored chronic. Background mild patchy and ill-defined hypoattenuation within the cerebral white matter, nonspecific but compatible chronic small vessel ischemic disease. This includes a chronic lacunar infarct within the right centrum semiovale/corona radiata. 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 There is no acute intracranial hemorrhage. No extra-axial fluid collection. No evidence of an intracranial mass. No midline shift. Vascular: Hyperdense M1 left MCA segment, highly suspicious for presence of endoluminal thrombus. Skull: No fracture or aggressive osseous lesion. Sinuses/Orbits: No mass or acute finding within the imaged orbits. Small mucous retention cyst versus polyp within the left maxillary sinus. Minimal mucosal thickening within the right sphenoid sinus and scattered within the bilateral ethmoid sinuses. ASPECTS ([place] Stroke Program Early CT Score) - Ganglionic level infarction (caudate, lentiform nuclei, internal capsule, insula, M1-M3 cortex): 6 - Supraganglionic infarction (M4-M6 cortex): 3 Total score (0-10 with 10 being normal): 9 Critical/emergent results were called by telephone at the time of interpretation on 09/15/2021 at 3:45 pm to provider JULIE HAVILAND , who verbally acknowledged these results. IMPRESSION: 1. Hyperdense left M1 MCA segment highly suspicious for the presence of endoluminal thrombus. 2. Suspected subtle acute infarct involving the left insula. ASPECTS is 9. 3. Small left MCA territory cortically-based infarct within the left parietal lobe (with involvement of the postcentral gyrus), favored chronic. 4. Background mild chronic small ischemic changes within the cerebral white matter. This includes a chronic lacunar infarct within the right centrum semiovale/corona radiata. 5. Multiple small chronic infarcts within the right cerebellar  hemisphere. 6. Mild generalized parenchymal atrophy. Electronically Signed   By: Kellie Simmering D.O.   On: 09/15/2021 15:50     PHYSICAL EXAM  Temp:  [93.4 F (34.1 C)-99 F (37.2 C)] 99 F (37.2 C) (06/15 1545) Pulse Rate:  [41-78] 58 (06/15 1830) Resp:  [12-36] 14 (06/15 1830) BP: (92-161)/(45-74) 106/54 (06/15 1800) SpO2:  [88 %-100 %] 98 % (06/15 1830) Arterial Line BP: (84-169)/(42-74) 112/47 (06/15 1830) FiO2 (%):  [40 %-100 %] 60 % (06/15 1507)  General - Well nourished, well developed, intubated on heavy sedation.  Ophthalmologic - fundi not  visualized due to noncooperation.  Cardiovascular - Regular rate and rhythm.  Neuro - intubated on heavy sedation, eyes closed, not following commands. With forced eye opening, eyes in left gaze position, not blinking to visual threat, doll's eyes absent, not tracking, bilateral pupil pinpoint. Corneal reflex absent, gag and cough absent. Breathing not over the vent.  Facial symmetry not able to test due to ET tube.  Tongue protrusion not cooperative. On pain stimulation, no movement in all extremities. DTR diminished and no babinski. Sensation, coordination and gait not tested.    ASSESSMENT/PLAN Eugene Campbell is a 81 y.o. male with history of CHF, A-fib on Eliquis CKD, hypertension, diabetes, CHF, PE in 2022, chronic DVT, aortic stenosis status post AVR admitted for aphasia, right-sided weakness, right facial droop. No tPA given due to on Eliquis.    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 IR with left M1 occlusion s/p TICI3 reperfusion MRI left BG, CR and mesial temporal  lobe small infarcts MRA left M1 occlusion recannulized, patent left MCA.  Stable intracranial atherosclerosis and stenosis most notably both ICA siphon, right MCA bifurcation and distal right VA 2D Echo EF 20 to 25% LDL 66 HgbA1c 5.9 Heparin subcu for VTE prophylaxis Eliquis (apixaban) daily prior to admission, now on aspirin 325 mg daily. Will restart DOAC once appropriate. Ongoing aggressive stroke risk factor management Therapy recommendations: Pending Disposition: Pending  Respiratory failure Aspiration Intubated for procedure and airway protection Had a massive aspiration 6/15 On Rocephin Ventilation management per CCM  Chronic A-fib On Eliquis at home Rate controlled Also on amiodarone Follow-up with Dr. Jac Canavan at [place] EF 20 to 25% Follows with Dr. Jac Canavan at [place] Not on significant heart failure regimen given hypotension and bradycardia.  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 This patient's care requires constant monitoring of vital signs, hemodynamics, respiratory and cardiac monitoring, review of multiple databases, neurological assessment, discussion with family, other  specialists and medical decision making of high complexity. I spent 35 minutes of neurocritical care time in the care of this patient.    Rosalin Hawking, MD PhD Stroke Neurology 09/16/2021 6:49 PM    To contact Stroke Continuity provider, please refer to [email or web address] After hours, contact General Neurology

## 2021-09-16 NOTE — Progress Notes (Signed)
eLink Physician-Brief Progress Note Patient Name: Eugene Campbell DOB: 1940/05/25 MRN: [id number]   Date of Service  09/16/2021  HPI/Events of Note    eICU Interventions  Patient switched to full range Norepinephrine gtt dosing after PICC line placed.        Migdalia Dk 09/16/2021, 8:39 PM

## 2021-09-16 NOTE — Progress Notes (Signed)
  Echocardiogram 2D Echocardiogram has been performed.  Eugene Campbell 09/16/2021, 3:11 PM

## 2021-09-16 NOTE — Progress Notes (Signed)
OT Cancellation Note  Patient Details Name: Aja Bolander MRN: [id number] DOB: 10-Jan-1941   Cancelled Treatment:    Reason Eval/Treat Not Completed: Patient not medically ready Intubated/sedated, FiO2 70% and strict bedrest orders still noted in chart. Will hold OT attempts today and follow up tomorrow.   Lorre Munroe 09/16/2021, 12:12 PM

## 2021-09-16 NOTE — Progress Notes (Signed)
PT Cancellation Note  Patient Details Name: Chico Cawood MRN: [id number] DOB: 1940/04/15   Cancelled Treatment:    Reason Eval/Treat Not Completed: Patient not medically ready.  Newly intubated.  Will check on 6/16. 09/16/2021  Jacinto Halim., PT Acute Rehabilitation Services [phone]  (pager) [phone]  (office)   Eliseo Gum Amore Ackman 09/16/2021, 10:48 AM

## 2021-09-16 NOTE — Progress Notes (Signed)
Peripherally Inserted Central Catheter Placement  The IV Nurse has discussed with the patient and/or persons authorized to consent for the patient, the purpose of this procedure and the potential benefits and risks involved with this procedure.  The benefits include less needle sticks, lab draws from the catheter, and the patient may be discharged home with the catheter. Risks include, but not limited to, infection, bleeding, blood clot (thrombus formation), and puncture of an artery; nerve damage and irregular heartbeat and possibility to perform a PICC exchange if needed/ordered by physician.  Alternatives to this procedure were also discussed.  Bard Power PICC patient education guide, fact sheet on infection prevention and patient information card has been provided to patient /or left at bedside.    PICC Placement Documentation  PICC Triple Lumen 09/16/21 Right Brachial 39 cm 0 cm (Active)  Indication for Insertion or Continuance of Line Limited venous access - need for IV therapy >5 days (PICC only);Vasoactive infusions 09/16/21 1405  Exposed Catheter (cm) 0 cm 09/16/21 1405  Site Assessment Clean, Dry, Intact 09/16/21 1405  Lumen #1 Status Flushed;Saline locked;Blood return noted 09/16/21 1405  Lumen #2 Status Flushed;Saline locked;Blood return noted 09/16/21 1405  Lumen #3 Status Flushed;Saline locked;Blood return noted 09/16/21 1405  Dressing Type Transparent;Securing device 09/16/21 1405  Dressing Status Antimicrobial disc in place;Clean, Dry, Intact 09/16/21 1405  Dressing Intervention New dressing 09/16/21 1405  Dressing Change Due 09/23/21 09/16/21 1405       Curt Jews 09/16/2021, 2:09 PM

## 2021-09-16 NOTE — Progress Notes (Signed)
Initial Nutrition Assessment  DOCUMENTATION CODES:   Severe malnutrition in context of chronic illness  INTERVENTION:   Tube feeding recommendations: - Start Osmolite 1.5 @ 20 ml/hr and advance by 10 ml q 8 hours to goal rate of 50 ml/hr (1200 ml/day) - ProSource TF 45 ml BID  Recommended tube feeding regimen at goal rate would provide 1880 kcal, 97 grams of protein, and 914 ml of H2O.   NUTRITION DIAGNOSIS:   Severe Malnutrition related to chronic illness (CHF, CKD stage IIIa) as evidenced by severe fat depletion, severe muscle depletion.  GOAL:   Patient will meet greater than or equal to 90% of their needs  MONITOR:   Vent status, Weight trends, I & O's, Labs  REASON FOR ASSESSMENT:   Ventilator    ASSESSMENT:   81 year old male who presented to the ED on 6/14 as a Code Stroke. PMH of CKD stage IIIa, T2DM, CHF, PE, atrial fibrillation. Pt required intubation. Pt admitted with L MCA CVA.  06/14 - /p mechanical thrombectomy with complete recanalization  Discussed pt with RN and during ICU rounds. Pt with massive episode of emesis this morning (>1 L). CCM suspects aspiration occurred. KUB was obtained showing OG tube side port at GE junction, gas-filled bowel loops throughout abdomen and pelvis, and possible dilated small bowel. Concern for ileus. OG tube to be advanced and placed to LIWS.  Discussed pt with CCM NP. No plans to start tube feeds today. RD to leave recommendations.  No family present at time of RD visit. Unable to obtain diet and weight history at this time. No weight history in chart. Pt meets criteria for severe malnutrition.  Patient is currently intubated on ventilator support MV: 12.7 L/min Temp (24hrs), Avg:96.6 F (35.9 C), Min:93.4 F (34.1 C), Max:98.1 F (36.7 C)  Drips: Propofol: 18 ml/hr (provides 475 kcal daily from lipid) Fentanyl Cleviprex: off this AM Levophed  Medications reviewed and include: colace, SSI q 4 hours, IV protonix,  miralax, senna, IV abx, IV magnesium sulfate 2 grams once, IV sodium phosphate 15 mmol once  Labs reviewed: creatinine 1.39, ionized calcium 1.07, WBC 11.9, hemoglobin A1C 5.9, phosphorus 2.2 on 6/14 CBG's: 122-228 x  24 hours  UOP: 2400 ml x 24 hours I/O's: -785 ml since admit  NUTRITION - FOCUSED PHYSICAL EXAM:  Flowsheet Row Most Recent Value  Orbital Region Severe depletion  Upper Arm Region Severe depletion  Thoracic and Lumbar Region Severe depletion  Buccal Region Unable to assess  Temple Region Severe depletion  Clavicle Bone Region Severe depletion  Clavicle and Acromion Bone Region Severe depletion  Scapular Bone Region Severe depletion  Dorsal Hand Severe depletion  Patellar Region Severe depletion  Anterior Thigh Region Moderate depletion  Posterior Calf Region Severe depletion  Edema (RD Assessment) None  Hair Reviewed  Eyes Reviewed  Mouth Reviewed  Skin Reviewed  Nails Reviewed       Diet Order:   Diet Order             Diet NPO time specified  Diet effective now                   EDUCATION NEEDS:   Not appropriate for education at this time  Skin:  Skin Assessment: Reviewed RN Assessment  Last BM:  no documented BM  Height:   Ht Readings from Last 1 Encounters:  09/15/21 6\' 1"  (1.854 m)    Weight:   Wt Readings from Last 1 Encounters:  09/15/21 75.2  kg    BMI:  Body mass index is 21.87 kg/m.  Estimated Nutritional Needs:   Kcal:  1800-2000  Protein:  90-105 grams  Fluid:  1.8-2.0 L    Mertie Clause, MS, RD, LDN Inpatient Clinical Dietitian Please see [facility] for contact information.

## 2021-09-17 ENCOUNTER — Inpatient Hospital Stay (HOSPITAL_COMMUNITY): Payer: Medicare HMO

## 2021-09-17 DIAGNOSIS — K567 Ileus, unspecified: Secondary | ICD-10-CM | POA: Diagnosis not present

## 2021-09-17 DIAGNOSIS — E43 Unspecified severe protein-calorie malnutrition: Secondary | ICD-10-CM | POA: Insufficient documentation

## 2021-09-17 DIAGNOSIS — J9601 Acute respiratory failure with hypoxia: Secondary | ICD-10-CM | POA: Diagnosis not present

## 2021-09-17 DIAGNOSIS — I4811 Longstanding persistent atrial fibrillation: Secondary | ICD-10-CM | POA: Diagnosis not present

## 2021-09-17 DIAGNOSIS — I639 Cerebral infarction, unspecified: Secondary | ICD-10-CM | POA: Diagnosis not present

## 2021-09-17 LAB — CBC WITH DIFFERENTIAL/PLATELET
Abs Immature Granulocytes: 1.4 10*3/uL — ABNORMAL HIGH (ref 0.00–0.07)
Band Neutrophils: 50 %
Basophils Absolute: 0 10*3/uL (ref 0.0–0.1)
Basophils Relative: 0 %
Eosinophils Absolute: 0.1 10*3/uL (ref 0.0–0.5)
Eosinophils Relative: 1 %
HCT: 46.3 % (ref 39.0–52.0)
Hemoglobin: 15.8 g/dL (ref 13.0–17.0)
Lymphocytes Relative: 10 %
Lymphs Abs: 0.7 10*3/uL (ref 0.7–4.0)
MCH: 33.2 pg (ref 26.0–34.0)
MCHC: 34.1 g/dL (ref 30.0–36.0)
MCV: 97.3 fL (ref 80.0–100.0)
Metamyelocytes Relative: 13 %
Monocytes Absolute: 0.1 10*3/uL (ref 0.1–1.0)
Monocytes Relative: 1 %
Myelocytes: 7 %
Neutro Abs: 4.8 10*3/uL (ref 1.7–7.7)
Neutrophils Relative %: 18 %
Platelets: 104 10*3/uL — ABNORMAL LOW (ref 150–400)
RBC: 4.76 MIL/uL (ref 4.22–5.81)
RDW: 13.9 % (ref 11.5–15.5)
Smear Review: DECREASED
WBC: 7 10*3/uL (ref 4.0–10.5)
nRBC: 0 % (ref 0.0–0.2)

## 2021-09-17 LAB — POCT I-STAT 7, (LYTES, BLD GAS, ICA,H+H)
Acid-base deficit: 3 mmol/L — ABNORMAL HIGH (ref 0.0–2.0)
Bicarbonate: 24.4 mmol/L (ref 20.0–28.0)
Calcium, Ion: 1.1 mmol/L — ABNORMAL LOW (ref 1.15–1.40)
HCT: 46 % (ref 39.0–52.0)
Hemoglobin: 15.6 g/dL (ref 13.0–17.0)
O2 Saturation: 99 %
Patient temperature: 99
Potassium: 5.1 mmol/L (ref 3.5–5.1)
Sodium: 132 mmol/L — ABNORMAL LOW (ref 135–145)
TCO2: 26 mmol/L (ref 22–32)
pCO2 arterial: 52.9 mmHg — ABNORMAL HIGH (ref 32–48)
pH, Arterial: 7.274 — ABNORMAL LOW (ref 7.35–7.45)
pO2, Arterial: 140 mmHg — ABNORMAL HIGH (ref 83–108)

## 2021-09-17 LAB — GLUCOSE, CAPILLARY
Glucose-Capillary: 114 mg/dL — ABNORMAL HIGH (ref 70–99)
Glucose-Capillary: 115 mg/dL — ABNORMAL HIGH (ref 70–99)
Glucose-Capillary: 136 mg/dL — ABNORMAL HIGH (ref 70–99)
Glucose-Capillary: 146 mg/dL — ABNORMAL HIGH (ref 70–99)
Glucose-Capillary: 151 mg/dL — ABNORMAL HIGH (ref 70–99)
Glucose-Capillary: 151 mg/dL — ABNORMAL HIGH (ref 70–99)

## 2021-09-17 LAB — COMPREHENSIVE METABOLIC PANEL
ALT: 41 U/L (ref 0–44)
AST: 31 U/L (ref 15–41)
Albumin: 2.6 g/dL — ABNORMAL LOW (ref 3.5–5.0)
Alkaline Phosphatase: 82 U/L (ref 38–126)
Anion gap: 9 (ref 5–15)
BUN: 31 mg/dL — ABNORMAL HIGH (ref 8–23)
CO2: 22 mmol/L (ref 22–32)
Calcium: 7.7 mg/dL — ABNORMAL LOW (ref 8.9–10.3)
Chloride: 103 mmol/L (ref 98–111)
Creatinine, Ser: 2.43 mg/dL — ABNORMAL HIGH (ref 0.61–1.24)
GFR, Estimated: 26 mL/min — ABNORMAL LOW (ref >60)
Glucose, Bld: 144 mg/dL — ABNORMAL HIGH (ref 70–99)
Potassium: 5.1 mmol/L (ref 3.5–5.1)
Sodium: 134 mmol/L — ABNORMAL LOW (ref 135–145)
Total Bilirubin: 0.9 mg/dL (ref 0.3–1.2)
Total Protein: 5.3 g/dL — ABNORMAL LOW (ref 6.5–8.1)

## 2021-09-17 LAB — PHOSPHORUS: Phosphorus: 7.2 mg/dL — ABNORMAL HIGH (ref 2.5–4.6)

## 2021-09-17 LAB — MAGNESIUM: Magnesium: 2.4 mg/dL (ref 1.7–2.4)

## 2021-09-17 LAB — HEPARIN LEVEL (UNFRACTIONATED): Heparin Unfractionated: 0.42 IU/mL (ref 0.30–0.70)

## 2021-09-17 MED ORDER — CHLORHEXIDINE GLUCONATE CLOTH 2 % EX PADS
6.0000 | MEDICATED_PAD | Freq: Every day | CUTANEOUS | Status: DC
  Administered 2021-09-17 – 2021-09-22 (×6): 6 via TOPICAL

## 2021-09-17 MED ORDER — FENTANYL CITRATE (PF) 100 MCG/2ML IJ SOLN
25.0000 ug | INTRAMUSCULAR | Status: DC | PRN
  Administered 2021-09-18: 100 ug via INTRAVENOUS

## 2021-09-17 MED ORDER — HEPARIN (PORCINE) 25000 UT/250ML-% IV SOLN
1550.0000 [IU]/h | INTRAVENOUS | Status: AC
  Administered 2021-09-17 – 2021-09-18 (×2): 1000 [IU]/h via INTRAVENOUS
  Administered 2021-09-19: 1200 [IU]/h via INTRAVENOUS
  Filled 2021-09-17 (×4): qty 250

## 2021-09-17 MED ORDER — VASOPRESSIN 20 UNITS/100 ML INFUSION FOR SHOCK
0.0000 [IU]/min | INTRAVENOUS | Status: DC
  Administered 2021-09-17: 0.03 [IU]/min via INTRAVENOUS
  Filled 2021-09-17: qty 100

## 2021-09-17 NOTE — Progress Notes (Signed)
RestartSTROKE TEAM PROGRESS NOTE   SUBJECTIVE (INTERVAL HISTORY) His RN, wife and daughter are at the bedside.  Pt is still intubated, on ventilation but off sedation. Able to open eyes on voice and able to following most simple commands, moving all extremities symmetrically. OG still on wall suctioning but able to take meds.   OBJECTIVE Temp:  [98.1 F (36.7 C)-99.5 F (37.5 C)] 98.1 F (36.7 C) (06/16 1210) Pulse Rate:  [54-94] 64 (06/16 1045) Cardiac Rhythm: Atrial fibrillation (06/16 0800) Resp:  [12-27] 20 (06/16 1045) BP: (78-131)/(45-79) 131/50 (06/16 0500) SpO2:  [95 %-100 %] 100 % (06/16 1208) Arterial Line BP: (84-158)/(42-81) 116/54 (06/16 1045) FiO2 (%):  [40 %-60 %] 40 % (06/16 1208)  Recent Labs  Lab 09/16/21 1945 09/16/21 2321 09/17/21 0329 09/17/21 0719 09/17/21 1208  GLUCAP 147* 148* 151* 146* 151*   Recent Labs  Lab 09/15/21 1525 09/15/21 1532 09/15/21 1732 09/15/21 1946 09/16/21 0409 09/16/21 1130 09/17/21 0344 09/17/21 0418  NA 137 136 134*  --  136 137 132* 134*  K 4.8 4.6 4.1  --  4.6 3.9 5.1 5.1  CL 101 99  --   --  103  --   --  103  CO2 27  --   --   --  23  --   --  22  GLUCOSE 113* 104*  --   --  161*  --   --  144*  BUN 15 18  --   --  14  --   --  31*  CREATININE 1.54* 1.60*  --   --  1.39*  --   --  2.43*  CALCIUM 8.6*  --   --   --  8.9  --   --  7.7*  MG  --   --   --  1.9  --   --   --  2.4  PHOS  --   --   --  2.2*  --   --   --  7.2*   Recent Labs  Lab 09/15/21 1525 09/17/21 0418  AST 34 31  ALT 28 41  ALKPHOS 94 82  BILITOT 0.8 0.9  PROT 5.9* 5.3*  ALBUMIN 3.2* 2.6*   Recent Labs  Lab 09/15/21 1525 09/15/21 1532 09/15/21 1732 09/16/21 0409 09/16/21 1130 09/17/21 0344 09/17/21 0418  WBC 7.8  --   --  11.9*  --   --  7.0  NEUTROABS 4.2  --   --   --   --   --  4.8  HGB 14.5   < > 10.9* 14.6 16.0 15.6 15.8  HCT 43.2   < > 32.0* 42.9 47.0 46.0 46.3  MCV 98.6  --   --  95.5  --   --  97.3  PLT 109*  --   --  112*   --   --  104*   < > = values in this interval not displayed.   No results for input(s): "CKTOTAL", "CKMB", "CKMBINDEX", "TROPONINI" in the last 168 hours. Recent Labs    09/15/21 1525  LABPROT 14.2  INR 1.1   Recent Labs    09/16/21 0251  COLORURINE YELLOW  LABSPEC 1.030  PHURINE 5.0  GLUCOSEU >=500*  HGBUR NEGATIVE  BILIRUBINUR NEGATIVE  KETONESUR NEGATIVE  PROTEINUR NEGATIVE  NITRITE NEGATIVE  LEUKOCYTESUR NEGATIVE       Component Value Date/Time   CHOL 133 09/16/2021 0409   TRIG 38 09/16/2021 0410   HDL 59 09/16/2021  0409   CHOLHDL 2.3 09/16/2021 0409   VLDL 8 09/16/2021 0409   LDLCALC 66 09/16/2021 0409   Lab Results  Component Value Date   HGBA1C 5.9 (H) 09/15/2021   No results found for: "LABOPIA", "COCAINSCRNUR", "LABBENZ", "AMPHETMU", "THCU", "LABBARB"  Recent Labs  Lab 09/15/21 1946  ETH <10    I have personally reviewed the radiological images below and agree with the radiology interpretations.  DG Chest Port 1 View  Result Date: 09/17/2021 CLINICAL DATA:  Hypoxia. 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 Endotracheal tube terminates at the level of the clavicles, well above the carina. Right PICC terminates over the SVC, unchanged. Enteric tube courses into the abdomen with tip not imaged. The cardiac silhouette remains mildly enlarged. Left perihilar and left lower lobe opacities are unchanged. No sizable pleural effusion or pneumothorax is identified. IMPRESSION: Unchanged left perihilar and lower lobe airspace disease concerning for pneumonia. Electronically Signed   By: Logan Bores M.D.   On: 09/17/2021 08:54   ECHOCARDIOGRAM COMPLETE  Result Date: 09/16/2021    ECHOCARDIOGRAM REPORT   Patient Name:   [facility] Date of Exam: 09/16/2021 Medical Rec #:  [id number]      Height:       73.0 in Accession #:    [id number]     Weight:       165.8 lb Date of  Birth:  1940-08-05      BSA:          1.987 m Patient Age:    81 years       BP:           102/60 mmHg Patient Gender: M              HR:           63 bpm. Exam Location:  Inpatient Procedure: 2D Echo, Cardiac Doppler and Color Doppler Indications:    Stroke  History:        Patient has no prior history of Echocardiogram examinations.                 Aortic Valve Disease, Arrythmias:Atrial Fibrillation; Risk                 Factors:Former Smoker and Diabetes. AVR. Acute respiratory                 failure with hypoxia. HFrEF.                 Aortic Valve: valve is present in the aortic position. Procedure                 Date: unknown.  Sonographer:    Clayton Lefort RDCS (AE) Referring Phys: [id number] [place]  Sonographer Comments: Echo performed with patient supine and on artificial respirator. Unable to use Definity due to lack of line access per nurse. IMPRESSIONS  1. Left ventricular ejection fraction, by estimation, is 20 to 25%. The left ventricle has severely decreased function. The left ventricle demonstrates global hypokinesis. Left ventricular diastolic parameters are consistent with Grade I diastolic dysfunction (impaired relaxation).  2. Right ventricular systolic function is mildly reduced. The right ventricular size is mildly enlarged.  3. Left atrial size was mildly dilated.  4. Right atrial size was severely dilated.  5. The mitral valve is normal in structure. No evidence of mitral valve regurgitation. No evidence of mitral stenosis.  6.  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 The aortic valve has been repaired/replaced. Aortic valve regurgitation is not visualized. No aortic stenosis is present. There is a valve present in the aortic position. Procedure Date: unknown. Aortic valve mean gradient measures 8.0 mmHg. Aortic valve Vmax measures 2.00 m/s.  7. The inferior vena cava is normal in size with greater than 50% respiratory variability, suggesting right  atrial pressure of 3 mmHg. FINDINGS  Left Ventricle: Left ventricular ejection fraction, by estimation, is 20 to 25%. The left ventricle has severely decreased function. The left ventricle demonstrates global hypokinesis. The left ventricular internal cavity size was normal in size. There is no left ventricular hypertrophy. Left ventricular diastolic parameters are consistent with Grade I diastolic dysfunction (impaired relaxation). Right Ventricle: The right ventricular size is mildly enlarged. No increase in right ventricular wall thickness. Right ventricular systolic function is mildly reduced. Left Atrium: Left atrial size was mildly dilated. Right Atrium: Right atrial size was severely dilated. Pericardium: There is no evidence of pericardial effusion. Mitral Valve: The mitral valve is normal in structure. Mild to moderate mitral annular calcification. No evidence of mitral valve regurgitation. No evidence of mitral valve stenosis. Tricuspid Valve: The tricuspid valve is normal in structure. Tricuspid valve regurgitation is not demonstrated. No evidence of tricuspid stenosis. 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 The aortic valve has been repaired/replaced. Aortic valve regurgitation is not visualized. No aortic stenosis is present. Aortic valve mean gradient measures 8.0 mmHg. Aortic valve peak gradient measures 16.0 mmHg. Aortic valve area, by VTI measures 1.10 cm. There is a valve present in the aortic position. Procedure Date: unknown. Pulmonic Valve: The pulmonic valve was not well visualized. Pulmonic valve regurgitation is trivial. No evidence of pulmonic stenosis. Aorta: The aortic root is normal in size and structure. Venous: The inferior vena cava is normal in size with greater than 50% respiratory variability, suggesting right atrial pressure of 3 mmHg. IAS/Shunts: No atrial level shunt detected by color flow Doppler.  LEFT VENTRICLE PLAX 2D  LVIDd:         5.30 cm      Diastology LVIDs:         4.90 cm      LV e' medial:    4.13 cm/s LV PW:         1.20 cm      LV E/e' medial:  19.8 LV IVS:        2.00 cm      LV e' lateral:   9.14 cm/s LVOT diam:     2.00 cm      LV E/e' lateral: 8.9 LV SV:         38 LV SV Index:   19 LVOT Area:     3.14 cm  LV Volumes (MOD) LV vol d, MOD A2C: 158.0 ml LV vol d, MOD A4C: 170.0 ml LV vol s, MOD A2C: 94.3 ml LV vol s, MOD A4C: 139.0 ml LV SV MOD A2C:     63.7 ml LV SV MOD A4C:     170.0 ml LV SV MOD BP:      53.6 ml RIGHT VENTRICLE RV Basal diam:  3.20 cm RV S prime:     6.74 cm/s TAPSE (M-mode): 0.8 cm LEFT ATRIUM             Index        RIGHT ATRIUM  Index LA diam:        3.90 cm 1.96 cm/m   RA Area:     25.90 cm LA Vol (A2C):   66.4 ml 33.42 ml/m  RA Volume:   85.90 ml  43.24 ml/m LA Vol (A4C):   56.6 ml 28.49 ml/m LA Biplane Vol: 66.5 ml 33.47 ml/m  AORTIC VALVE AV Area (Vmax):    1.20 cm AV Area (Vmean):   1.25 cm AV Area (VTI):     1.10 cm AV Vmax:           200.00 cm/s AV Vmean:          131.000 cm/s AV VTI:            0.349 m AV Peak Grad:      16.0 mmHg AV Mean Grad:      8.0 mmHg LVOT Vmax:         76.40 cm/s LVOT Vmean:        52.300 cm/s LVOT VTI:          0.122 m LVOT/AV VTI ratio: 0.35  AORTA Ao Root diam: 2.70 cm Ao Asc diam:  4.00 cm MITRAL VALVE MV Area (PHT): 4.06 cm    SHUNTS MV Decel Time: 187 msec    Systemic VTI:  0.12 m MV E velocity: 81.80 cm/s  Systemic Diam: 2.00 cm Kardie Tobb DO Electronically signed by Berniece Salines DO Signature Date/Time: 09/16/2021/5:54:30 PM    Final    DG CHEST PORT 1 VIEW  Result Date: 09/16/2021 CLINICAL DATA:  PICC line placement. EXAM: PORTABLE CHEST 1 VIEW COMPARISON:  Chest x-ray 09/16/2021 FINDINGS: Endotracheal tube tip is 5.2 cm above the carina. Enteric tube extends below the diaphragm. There is a new right upper extremity PICC terminating in the SVC. Patient is status post cardiac surgery and valve replacement, unchanged. Cardiac  silhouette is enlarged. There is some increasing retrocardiac opacities. No pleural effusion or pneumothorax identified. Osseous structures are stable. IMPRESSION: 1. Right upper extremity PICC terminates in the SVC. 2. Increasing retrocardiac atelectasis/airspace disease. 3. Stable cardiomegaly. Electronically Signed   By: Ronney Asters M.D.   On: 09/16/2021 15:30   [place] EKG SITE RITE  Result Date: 09/16/2021 If Site Rite image not attached, placement could not be confirmed due to current cardiac rhythm.  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 Patient's last known normal was at 1430 NIH stroke scale of 24, modified Rankin scale of 1. TNK was not given as the patient was on apixaban. EXAM: ULTRASOUND-GUIDED VASCULAR ACCESS DIAGNOSTIC CEREBRAL ANGIOGRAM MECHANICAL THROMBECTOMY FLAT PANEL HEAD CT COMPARISON:  None Available. MEDICATIONS: Ancef 2 g IV. The antibiotic was administered within 1 hour of the procedure Performing surgeon: Dr. Frazier Richards Assistant: Dr. Karenann Cai ANESTHESIA/SEDATION: General anesthesia CONTRAST:  Twenty-five mL of Omnipaque 300 milligram/mL FLUOROSCOPY: Radiation Exposure Index (as provided by the fluoroscopic device): 929 mGy Kerma COMPLICATIONS: SIR Level A - No therapy, no consequence. TECHNIQUE: Informed written consent was obtained from the patient's wife after a thorough discussion of the procedural risks, benefits and alternatives. All questions were addressed. Maximal Sterile Barrier Technique was utilized including caps, mask, sterile gowns, sterile gloves, sterile drape, hand hygiene and skin antiseptic. A timeout was performed prior to the initiation of the procedure. The right groin was prepped  and draped in the usual sterile fashion.  Using a micropuncture kit and the modified Seldinger technique, access was gained to the right common femoral artery and an 8 [place] by 25 cm sheath was placed. Real-time ultrasound guidance was utilized for vascular access including the acquisition of a permanent ultrasound image documenting patency of the accessed vessel. Under fluoroscopy, a Zoom 88 guide catheter was navigated over a 5 [place] VTK catheter and a 0.035" Terumo Glidewire into the aortic arch. The catheter was placed into the left common carotid artery and then advanced into the left internal carotid artery under roadmap guidance. The diagnostic catheter was removed. Frontal and lateral angiograms of the head were obtained. FINDINGS: 1. Normal caliber of the right common femoral artery, adequate for vascular access. 2. Occlusion of a proximal left M1/MCA. 3. Delayed collaterals from the left ACA to the left MCA vascular tree. PROCEDURE: Using biplane roadmap, a zoom 071 aspiration catheter was navigated over an Aristotle 24 microguidewire into the cavernous segment of the left ICA. The aspiration catheter was then advanced to the level of occlusion at the left M1 MCA and connected to an aspiration pump. Continuous aspiration was performed for 2 minutes. The guide catheter was connected to a VacLok syringe. The aspiration catheter was subsequently removed under constant aspiration. The guide catheter was aspirated for debris. Then, biplane DSA cerebral angiogram was performed by injecting contrast through the guide catheter which demonstrated complete recanalization of the proximal left MCA and its branches (TICI 3). The MCA and its branches have a normal appearance. No aneurysm, av malformation, dural fistula or vasculitis seen. The venous phase of the cerebral angiogram has a normal appearance with a preferential venous return into the right transverse, sigmoid sinuses and the into the internal jugular vein. Mild narrowing of the left transverse  sinus. Flat panel CT of the head was obtained and post processed in a separate workstation with concurrent attending physician supervision. Selected images were sent to PACS. Flat panel CT of the head has a normal appearance without evidence of a bleed. Right common femoral artery angiogram was obtained in right anterior oblique view. The puncture is at the level of the common femoral artery. The artery has normal caliber, adequate for closure device. The sheath was exchanged over the wire for a Perclose prostyle which was utilized for access closure. Immediate hemostasis was achieved. IMPRESSION: 1. Successful mechanical thrombectomy for treatment of an occlusion of the proximal left M1/MCA. Complete recanalization (TICI 3) of the left MCA was attained. 2. Flat panel CT head was unremarkable. PLAN: 1. Transfer to ICU. 2. Blood pressure goals of 120-140 mm Hg. Electronically Signed   By: Frazier Richards M.D.   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 Patient's last known normal was at 1430 NIH stroke scale of 24, modified Rankin scale of 1. TNK was not given as the patient was on apixaban. EXAM: ULTRASOUND-GUIDED VASCULAR ACCESS DIAGNOSTIC CEREBRAL ANGIOGRAM MECHANICAL THROMBECTOMY FLAT PANEL HEAD CT COMPARISON:  None Available. MEDICATIONS: Ancef 2 g IV. The antibiotic was administered within 1 hour of the procedure Performing surgeon: Dr. Frazier Richards Assistant: Dr. Karenann Cai ANESTHESIA/SEDATION: General anesthesia CONTRAST:  Twenty-five mL of Omnipaque 300 milligram/mL FLUOROSCOPY: Radiation Exposure Index (as provided by the fluoroscopic device): 407 mGy Kerma COMPLICATIONS: SIR  Level A - No therapy, no consequence. TECHNIQUE: Informed written consent was obtained from the patient's wife  after a thorough discussion of the procedural risks, benefits and alternatives. All questions were addressed. Maximal Sterile Barrier Technique was utilized including caps, mask, sterile gowns, sterile gloves, sterile drape, hand hygiene and skin antiseptic. A timeout was performed prior to the initiation of the procedure. The right groin was prepped and draped in the usual sterile fashion. Using a micropuncture kit and the modified Seldinger technique, access was gained to the right common femoral artery and an 8 [place] by 25 cm sheath was placed. Real-time ultrasound guidance was utilized for vascular access including the acquisition of a permanent ultrasound image documenting patency of the accessed vessel. Under fluoroscopy, a Zoom 88 guide catheter was navigated over a 5 [place] VTK catheter and a 0.035" Terumo Glidewire into the aortic arch. The catheter was placed into the left common carotid artery and then advanced into the left internal carotid artery under roadmap guidance. The diagnostic catheter was removed. Frontal and lateral angiograms of the head were obtained. FINDINGS: 1. Normal caliber of the right common femoral artery, adequate for vascular access. 2. Occlusion of a proximal left M1/MCA. 3. Delayed collaterals from the left ACA to the left MCA vascular tree. PROCEDURE: Using biplane roadmap, a zoom 071 aspiration catheter was navigated over an Aristotle 24 microguidewire into the cavernous segment of the left ICA. The aspiration catheter was then advanced to the level of occlusion at the left M1 MCA and connected to an aspiration pump. Continuous aspiration was performed for 2 minutes. The guide catheter was connected to a VacLok syringe. The aspiration catheter was subsequently removed under constant aspiration. The guide catheter was aspirated for debris. Then, biplane DSA cerebral angiogram was performed by injecting contrast through the guide catheter which demonstrated complete  recanalization of the proximal left MCA and its branches (TICI 3). The MCA and its branches have a normal appearance. No aneurysm, av malformation, dural fistula or vasculitis seen. The venous phase of the cerebral angiogram has a normal appearance with a preferential venous return into the right transverse, sigmoid sinuses and the into the internal jugular vein. Mild narrowing of the left transverse sinus. Flat panel CT of the head was obtained and post processed in a separate workstation with concurrent attending physician supervision. Selected images were sent to PACS. Flat panel CT of the head has a normal appearance without evidence of a bleed. Right common femoral artery angiogram was obtained in right anterior oblique view. The puncture is at the level of the common femoral artery. The artery has normal caliber, adequate for closure device. The sheath was exchanged over the wire for a Perclose prostyle which was utilized for access closure. Immediate hemostasis was achieved. IMPRESSION: 1. Successful mechanical thrombectomy for treatment of an occlusion of the proximal left M1/MCA. Complete recanalization (TICI 3) of the left MCA was attained. 2. Flat panel CT head was unremarkable. PLAN: 1. Transfer to ICU. 2. Blood pressure goals of 120-140 mm Hg. Electronically Signed   By: Frazier Richards M.D.   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 Patient's last known normal was at 1430 NIH stroke scale of 24, modified Rankin scale of 1. TNK was not given as the patient was on apixaban. EXAM: ULTRASOUND-GUIDED  VASCULAR ACCESS DIAGNOSTIC CEREBRAL ANGIOGRAM MECHANICAL THROMBECTOMY FLAT PANEL HEAD CT COMPARISON:  None Available. MEDICATIONS: Ancef 2 g IV. The antibiotic was administered within 1 hour of the procedure  Performing surgeon: Dr. Frazier Richards Assistant: Dr. Karenann Cai ANESTHESIA/SEDATION: General anesthesia CONTRAST:  Twenty-five mL of Omnipaque 300 milligram/mL FLUOROSCOPY: Radiation Exposure Index (as provided by the fluoroscopic device): 500 mGy Kerma COMPLICATIONS: SIR Level A - No therapy, no consequence. TECHNIQUE: Informed written consent was obtained from the patient's wife after a thorough discussion of the procedural risks, benefits and alternatives. All questions were addressed. Maximal Sterile Barrier Technique was utilized including caps, mask, sterile gowns, sterile gloves, sterile drape, hand hygiene and skin antiseptic. A timeout was performed prior to the initiation of the procedure. The right groin was prepped and draped in the usual sterile fashion. Using a micropuncture kit and the modified Seldinger technique, access was gained to the right common femoral artery and an 8 [place] by 25 cm sheath was placed. Real-time ultrasound guidance was utilized for vascular access including the acquisition of a permanent ultrasound image documenting patency of the accessed vessel. Under fluoroscopy, a Zoom 88 guide catheter was navigated over a 5 [place] VTK catheter and a 0.035" Terumo Glidewire into the aortic arch. The catheter was placed into the left common carotid artery and then advanced into the left internal carotid artery under roadmap guidance. The diagnostic catheter was removed. Frontal and lateral angiograms of the head were obtained. FINDINGS: 1. Normal caliber of the right common femoral artery, adequate for vascular access. 2. Occlusion of a proximal left M1/MCA. 3. Delayed collaterals from the left ACA to the left MCA vascular tree. PROCEDURE: Using biplane roadmap, a zoom 071 aspiration catheter was navigated over an Aristotle 24 microguidewire into the cavernous segment of the left ICA. The aspiration catheter was then advanced to the level of occlusion at the left M1 MCA and  connected to an aspiration pump. Continuous aspiration was performed for 2 minutes. The guide catheter was connected to a VacLok syringe. The aspiration catheter was subsequently removed under constant aspiration. The guide catheter was aspirated for debris. Then, biplane DSA cerebral angiogram was performed by injecting contrast through the guide catheter which demonstrated complete recanalization of the proximal left MCA and its branches (TICI 3). The MCA and its branches have a normal appearance. No aneurysm, av malformation, dural fistula or vasculitis seen. The venous phase of the cerebral angiogram has a normal appearance with a preferential venous return into the right transverse, sigmoid sinuses and the into the internal jugular vein. Mild narrowing of the left transverse sinus. Flat panel CT of the head was obtained and post processed in a separate workstation with concurrent attending physician supervision. Selected images were sent to PACS. Flat panel CT of the head has a normal appearance without evidence of a bleed. Right common femoral artery angiogram was obtained in right anterior oblique view. The puncture is at the level of the common femoral artery. The artery has normal caliber, adequate for closure device. The sheath was exchanged over the wire for a Perclose prostyle which was utilized for access closure. Immediate hemostasis was achieved. IMPRESSION: 1. Successful mechanical thrombectomy for treatment of an occlusion of the proximal left M1/MCA. Complete recanalization (TICI 3) of the left MCA was attained. 2. Flat panel CT head was unremarkable. PLAN: 1. Transfer to ICU. 2. Blood pressure goals of 120-140 mm Hg. Electronically Signed   By: Frazier Richards M.D.   On: 09/16/2021 10:22  DG Abd Portable 1V  Addendum Date: 09/16/2021   ADDENDUM REPORT: 09/16/2021 08:27 ADDENDUM: Study discussed by telephone with Nurse Apolonio Schneiders on 09/16/2021 at 0823 hours. Electronically Signed   By: Genevie Ann M.D.    On: 09/16/2021 08:27   Result Date: 09/16/2021 CLINICAL DATA:  81 year old male with projectile vomiting. EXAM: PORTABLE ABDOMEN - 1 VIEW COMPARISON:  09/15/2021. FINDINGS: Portable AP supine view at 0756 hours. Enteric tube appears pulled back slightly, side hole now at the expected level of the GEJ. Stable epigastric and right upper quadrant surgical clips. Negative visible lung bases. Increasing gas-filled bowel loops throughout the abdomen and pelvis, superimposed on retained stool in the colon. Possible dilated small bowel now. But gas is present to the rectum. No definite pneumoperitoneum on these supine views. No acute osseous abnormality identified. IMPRESSION: 1. Enteric tube has been pulled back, side hole now at the level of the GEJ. Recommend advancing 6 cm to ensure side hole placement inside the stomach. 2. Increasing gas-filled bowel throughout the abdomen and pelvis, most suggestive of Acute Ileus. Electronically Signed: By: Genevie Ann M.D. On: 09/16/2021 08:18   DG CHEST PORT 1 VIEW  Result Date: 09/16/2021 CLINICAL DATA:  81 year old male with history of chest congestion. EXAM: PORTABLE CHEST 1 VIEW COMPARISON:  Chest x-ray 09/16/2021. FINDINGS: An endotracheal tube is in place with tip 7.4 cm above the carina. A nasogastric tube is seen extending into the stomach, however, the tip of the nasogastric tube extends below the lower margin of the image. Lung volumes are normal. No consolidative airspace disease. No pleural effusions. No pneumothorax. No pulmonary nodule or mass noted. Pulmonary vasculature and the cardiomediastinal silhouette are within normal limits. 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 IMPRESSION: 1. Support apparatus, as above. 2. No radiographic evidence of acute cardiopulmonary disease. 3. Aortic atherosclerosis. Electronically Signed   By: Vinnie Langton M.D.   On: 09/16/2021 08:12    DG Chest Port 1 View  Result Date: 09/16/2021 CLINICAL DATA:  81 year old male with history of stroke. EXAM: PORTABLE CHEST 1 VIEW COMPARISON:  Chest x-ray 09/15/2021. FINDINGS: An endotracheal tube is in place with tip 7.0 cm above the carina. A nasogastric tube is seen extending into the stomach, however, the tip of the nasogastric tube extends below the lower margin of the image. Lung volumes are normal. No consolidative airspace disease. No pleural effusions. No pneumothorax. No evidence of pulmonary edema. Heart size is mildly enlarged. Upper mediastinal contours are within normal limits. Atherosclerotic calcifications are noted in the thoracic aorta. Status post median sternotomy for aortic valve replacement (a stented bioprosthesis is noted). IMPRESSION: 1. Postoperative changes and support apparatus, as above. 2. No radiographic evidence of acute cardiopulmonary disease. 3. Mild cardiomegaly. 4. Aortic atherosclerosis. Electronically Signed   By: Vinnie Langton M.D.   On: 09/16/2021 05:50   MR ANGIO HEAD WO CONTRAST  Result Date: 09/16/2021 CLINICAL DATA:  81 year old male with paroxysmal atrial fibrillation on Eliquis. New onset aphasia, right side weakness. Code stroke presentation yesterday. Left M1 occlusion by CTA. EXAM: MRA HEAD WITHOUT CONTRAST TECHNIQUE: Angiographic images of the Circle of Willis were acquired using MRA technique without intravenous contrast. COMPARISON:  MRI brain today reported separately. CTA head and neck yesterday. FINDINGS: Anterior circulation: Antegrade flow in both ICA siphons with bilateral siphon irregularity in keeping with the atherosclerosis and stenosis better demonstrated by CTA. Carotid termini remain patent. And now the left MCA M1  segment, anterior temporal artery origin, and left MCA bifurcation are patent. Left MCA branches appear not significantly changed from yesterday, including a long segment M2 stenosis demonstrated in the posterior division on  series 1035, image 4. Right MCA M1 segment is patent but with moderate to severe stenosis upstream of the right MCA bifurcation, concordant with CTA appearance yesterday. Likewise, right MCA branches appear stable with mild irregularity. Anterior communicating artery and visible ACA branches are stable, mild right A2 stenosis. Posterior circulation: Antegrade flow in the dominant distal left vertebral artery with patent left PICA origin. No distal left vertebral stenosis. Asymmetrically decreased flow in the smaller distal right vertebral artery, which did appear atherosclerotic and stenotic on the CTA yesterday but was patent at that time to the vertebrobasilar junction. Right PICA origin appears to remain patent. Patent basilar artery without stenosis. Patent SCA and PCA origins. Bilateral PCA branches appear stable, within normal limits. Anatomic variants: Dominant left vertebral V4 segment. Other: Brain MRI reported separately today. IMPRESSION: 1. Recanalized Left MCA M1 segment. Patent left MCA bifurcation. Upstream ICA atherosclerosis and stenosis, and downstream MCA M2 branch stenosis appears unchanged from the CTA yesterday. 2. Elsewhere stable intracranial atherosclerosis and stenosis, most notably both ICA siphons, Right MCA bifurcation, distal Right Vertebral Artery. Electronically Signed   By: Genevie Ann M.D.   On: 09/16/2021 05:49   MR BRAIN WO CONTRAST  Result Date: 09/16/2021 CLINICAL DATA:  81 year old male with paroxysmal atrial fibrillation on Eliquis. New onset aphasia, right side weakness. Code stroke presentation yesterday. Left M1 occlusion. EXAM: MRI HEAD WITHOUT CONTRAST TECHNIQUE: Multiplanar, multiecho pulse sequences of the brain and surrounding structures were obtained without intravenous contrast. COMPARISON:  CT head and CTA head and neck yesterday. FINDINGS: Brain: Patchy restricted diffusion throughout the left basal ganglia including the caudate and the lentiform. There is some  involvement of the posterior limb of the left external capsule. And there is some mesial temporal lobe involvement on series 5, image 73. But otherwise Insula and other left MCA territory cortex seems spared. No contralateral right hemisphere or posterior fossa restricted diffusion. Multiple chronic generally small cerebellar infarcts on the right. Patchy and confluent bilateral cerebral white matter T2 and FLAIR hyperintensity with a small area of chronic left middle frontal gyrus encephalomalacia (series 10, image 19). There are several chronic microhemorrhages also in the left frontal lobe (series 22, image 35). No midline shift, mass effect, evidence of mass lesion, ventriculomegaly, extra-axial collection or acute intracranial hemorrhage. Cervicomedullary junction and pituitary are within normal limits. Vascular: Major intracranial vascular flow voids are preserved except for the distal right vertebral artery, which was non dominant but patent by CTA yesterday. Skull and upper cervical spine: Negative. Visualized bone marrow signal is within normal limits. Sinuses/Orbits: Postoperative changes to both globes. Paranasal Visualized paranasal sinuses and mastoids are stable and well aerated. Other: Intubated. Small volume fluid in the pharynx and nasal cavity. Grossly normal visible internal auditory structures. IMPRESSION: 1. Patchy acute infarct in the left basal ganglia and the left mesial temporal lobe with no associated hemorrhage or mass effect. 2. Underlying chronic small and medium-sized vessel ischemia. 3. MRA Head reported separately today. Electronically Signed   By: Genevie Ann M.D.   On: 09/16/2021 05:23   DG Abd Portable 1V  Result Date: 09/15/2021 CLINICAL DATA:  OG tube placement. EXAM: PORTABLE ABDOMEN - 1 VIEW COMPARISON:  None Available. FINDINGS: Tip and side port of the enteric tube below the diaphragm in the stomach. Enteric chain  sutures in the left upper quadrant as well as multiple  surgical clips. Surgical clips in the right upper quadrant. No bowel dilatation or evidence of obstruction in the included upper abdomen. IMPRESSION: Tip and side port of the enteric tube below the diaphragm in the stomach. Electronically Signed   By: Keith Rake M.D.   On: 09/15/2021 21:23   DG CHEST PORT 1 VIEW  Result Date: 09/15/2021 CLINICAL DATA:  Intubation. EXAM: PORTABLE CHEST 1 VIEW COMPARISON:  10/02/2020 FINDINGS: Endotracheal tube tip 5.4 cm from the carina at the level of the clavicular heads. Enteric tube in place with tip below the diaphragm not included on this chest field of view. Prior median sternotomy prosthetic aortic valve. Cardiomegaly. Hazy opacity at the left greater than right lung base likely represents combination of atelectasis and pleural effusions. Vascular congestion. No pneumothorax. IMPRESSION: 1. Endotracheal tube tip 5.4 cm from the carina at the level of the clavicular heads. Enteric tube in place with tip below the diaphragm. 2. Cardiomegaly. Vascular congestion. Hazy bibasilar lung opacities likely combination of pleural effusions and atelectasis. Suspect CHF. Electronically Signed   By: Keith Rake M.D.   On: 09/15/2021 21:21   CT ANGIO HEAD NECK W WO CM (CODE STROKE)  Result Date: 09/15/2021 CLINICAL DATA:  Provided history: Neuro deficit, acute, stroke suspected; right-sided weakness and facial droop. EXAM: CT ANGIOGRAPHY HEAD AND NECK TECHNIQUE: Multidetector CT imaging of the head and neck was performed using the standard protocol during bolus administration of intravenous contrast. Multiplanar CT image reconstructions and MIPs were obtained to evaluate the vascular anatomy. Carotid stenosis measurements (when applicable) are obtained utilizing NASCET criteria, using the distal internal carotid diameter as the denominator. RADIATION DOSE REDUCTION: This exam was performed according to the departmental dose-optimization program which includes automated  exposure control, adjustment of the mA and/or kV according to patient size and/or use of iterative reconstruction technique. CONTRAST:  3m OMNIPAQUE IOHEXOL 350 MG/ML SOLN COMPARISON:  Noncontrast head CT performed immediately prior 09/15/2021. FINDINGS: CTA NECK FINDINGS Aortic arch: Standard aortic branching. Atherosclerotic plaque within the visualized aortic arch and proximal major branch vessels of the neck. No hemodynamically significant innominate or proximal subclavian artery stenosis. Right carotid system: CCA and ICA patent within the neck. Mild scattered atherosclerotic plaque within the proximal to mid CCA. Prominent predominantly calcified plaque about the carotid bifurcation and within the proximal ICA. Resultant in 50-60% stenosis at the origin of the right ICA. Left carotid system: CCA and ICA patent within the neck. Mild atherosclerotic plaque scattered within the proximal to mid CCA. Prominent atherosclerotic plaque about the carotid bifurcation and within the proximal ICA. Resultant 40% stenosis of the proximal ICA. Vertebral arteries: Vertebral arteries patent within the neck. The left vertebral artery is dominant. Severe atherosclerotic stenosis at the origin of the right vertebral artery. Mild atherosclerotic narrowing at the origin of the left vertebral artery. Skeleton: Cervical spondylosis. No acute fracture or aggressive osseous lesion. Other neck: No neck mass or cervical lymphadenopathy. Upper chest: Prior median sternotomy. Smooth interlobular septal thickening within the imaged lung apices, likely reflecting interstitial edema. Partially imaged right pleural effusion. Review of the MIP images confirms the above findings CTA HEAD FINDINGS Anterior circulation: The intracranial internal carotid arteries are patent. Atherosclerotic plaque within both vessels. Most notably, there is moderate atherosclerotic narrowing of the distal cavernous/paraclinoid right ICA, and severe stenosis of  the distal cavernous/paraclinoid left ICA. The left M1 MCA segment is occluded at its origin. There is some reconstitution  of enhancement within M2 and more distal left MCA vessels. The right M1 segment is patent. Atherosclerotic irregularity of the M2 and more distal right MCA vessels. No appreciable right M2 proximal branch occlusion or high-grade proximal stenosis. The anterior cerebral arteries are patent. Posterior circulation: The intracranial vertebral arteries are patent. Nonstenotic atherosclerotic plaque within the non dominant V4 right vertebral artery. The basilar artery is patent. The posterior cerebral arteries are patent. Atherosclerotic irregularity of both vessels without high-grade proximal stenosis. Posterior communicating arteries are diminutive or absent, bilaterally. Venous sinuses: Within the limitations of contrast timing, no convincing thrombus. Anatomic variants: As described. Review of the MIP images confirms the above findings CTA head impression #1 called by telephone at the time of interpretation on 09/15/2021 at 3:50 pm to provider Isla Pence, who verbally acknowledged these results. IMPRESSION: CTA neck: 1. The common carotid, internal carotid and vertebral arteries are patent within the neck. 2. Atherosclerotic narrowing at the origin of the right ICA of 50-60%. 3. Atherosclerotic narrowing at the origin of the left ICA of 40%. 4. Severe stenosis at the origin of the non-dominant right vertebral artery. 5. Mild atherosclerotic narrowing at the origin of the dominant left vertebral artery. 6.  Aortic Atherosclerosis (ICD10-I70.0). 7. Interstitial edema within the imaged lung apices. 8. Partially imaged right pleural effusion. CTA head: 1. The M1 segment of the left middle cerebral artery is occluded at its origin. There is some reconstitution of enhancement within M2 and more distal left MCA vessels, likely due to collateral flow. 2. Additional intracranial atherosclerotic disease  with multifocal stenoses, most notably as follows. 3. Severe stenosis of the distal cavernous/paraclinoid left ICA. 4. Moderate stenosis of the distal cavernous/paraclinoid right ICA. Electronically Signed   By: Kellie Simmering D.O.   On: 09/15/2021 16:20   CT HEAD CODE STROKE WO CONTRAST  Result Date: 09/15/2021 CLINICAL DATA:  Code stroke. Neuro deficit, acute, stroke suspected. Right-sided weakness and facial droop. EXAM: CT HEAD WITHOUT CONTRAST TECHNIQUE: Contiguous axial images were obtained from the base of the skull through the vertex without intravenous contrast. RADIATION DOSE REDUCTION: This exam was performed according to the departmental dose-optimization program which includes automated exposure control, adjustment of the mA and/or kV according to patient size and/or use of iterative reconstruction technique. COMPARISON:  No pertinent prior exams available for comparison. FINDINGS: Brain: Mild generalized cerebral atrophy. Suspected subtle loss of gray-white differentiation within the left insula (for instance as seen on series 2, image 18). Small left MCA territory cortical/subcortical infarct within the left parietal lobe (involving the left postcentral gyrus). This is favored chronic. Background mild patchy and ill-defined hypoattenuation within the cerebral white matter, nonspecific but compatible chronic small vessel ischemic disease. This includes a chronic lacunar infarct within the right centrum semiovale/corona radiata. 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 There is no acute intracranial hemorrhage. No extra-axial fluid collection. No evidence of an intracranial mass. No midline shift. Vascular: Hyperdense M1 left MCA segment, highly suspicious for presence of endoluminal thrombus. Skull: No fracture or aggressive osseous lesion. Sinuses/Orbits: No mass or acute finding within the imaged orbits. Small mucous retention cyst versus polyp within the left maxillary sinus.  Minimal mucosal thickening within the right sphenoid sinus and scattered within the bilateral ethmoid sinuses. ASPECTS [facility] Stroke Program Early CT Score) - Ganglionic level infarction (caudate, lentiform nuclei, internal capsule, insula, M1-M3 cortex): 6 - Supraganglionic infarction (M4-M6 cortex): 3 Total score (0-10 with 10 being normal): 9 Critical/emergent results were called by telephone  at the time of interpretation on 09/15/2021 at 3:45 pm to provider [facility] , who verbally acknowledged these results. IMPRESSION: 1. Hyperdense left M1 MCA segment highly suspicious for the presence of endoluminal thrombus. 2. Suspected subtle acute infarct involving the left insula. ASPECTS is 9. 3. Small left MCA territory cortically-based infarct within the left parietal lobe (with involvement of the postcentral gyrus), favored chronic. 4. Background mild chronic small ischemic changes within the cerebral white matter. This includes a chronic lacunar infarct within the right centrum semiovale/corona radiata. 5. 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 6. Mild generalized parenchymal atrophy. Electronically Signed   By: Kellie Simmering D.O.   On: 09/15/2021 15:50     PHYSICAL EXAM  Temp:  [98.1 F (36.7 C)-99.5 F (37.5 C)] 98.1 F (36.7 C) (06/16 1210) Pulse Rate:  [54-94] 64 (06/16 1045) Resp:  [12-27] 20 (06/16 1045) BP: (78-131)/(45-79) 131/50 (06/16 0500) SpO2:  [95 %-100 %] 100 % (06/16 1208) Arterial Line BP: (84-158)/(42-81) 116/54 (06/16 1045) FiO2 (%):  [40 %-60 %] 40 % (06/16 1208)  General - Well nourished, well developed, intubated off sedation.  Ophthalmologic - fundi not visualized due to noncooperation.  Cardiovascular - irregularly irregular heart rate and rhythm.  Neuro - intubated off sedation, eyes open on voice, able to follow most simple midline and peripheral commands. With eye opening, eyes mid position, but not blinking to visual threat  consistently, not tracking, PERRL. Corneal reflex present, gag and cough present. Breathing over the vent.  Facial symmetry not able to test due to ET tube.  Tongue protrusion not cooperative. On moving all extremities against gravity and symmetrical. DTR 1+ and no babinski. Sensation, coordination not quite cooperative and gait not tested.    ASSESSMENT/PLAN Mr. Kee Drudge is a 81 y.o. male with history of CHF, A-fib on Eliquis CKD, hypertension, diabetes, CHF, PE in 2022, chronic DVT, aortic stenosis status post AVR admitted for aphasia, right-sided weakness, right facial droop. No tPA given due to on Eliquis.    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 IR with left M1 occlusion s/p TICI3 reperfusion MRI left BG, CR and mesial temporal lobe small infarcts MRA left M1 occlusion recannulized, patent left MCA.  Stable intracranial atherosclerosis and stenosis most notably both ICA siphon, right MCA bifurcation and distal right VA 2D Echo EF 20 to 25% LDL 66 HgbA1c 5.9 Heparin subcu for VTE prophylaxis Eliquis (apixaban) daily prior to admission, given small infarcts, OK to start [facility] with heparin IV. Transition to po once po access.  Ongoing aggressive stroke risk factor management Therapy recommendations: Pending Disposition: Pending  Respiratory failure Aspiration Intubated for procedure and airway protection Had a massive aspiration 6/15 On Rocephin Ventilation management per CCM improving  Chronic A-fib On Eliquis at home Rate controlled Also on amiodarone Follow-up with Dr. Jac Canavan at [place] now on heparin IV,  transition to po once po access  Cardiomyopathy EF 20 to 25% Follows with Dr. Jac Canavan at [place] Not on significant heart failure regimen given hypotension and bradycardia. now on heparin IV, transition to po once po access  Ileus Likely  the cause of large vomitus with aspiration Bowel rest OG wall suctioning  Diabetes HgbA1c 5.9 goal < 7.0 Controlled CBG monitoring SSI DM education and close PCP follow up  History of hypertension Hypotension BP on the low end On Levophed CCM on board Long term BP goal normotensive  Lipid management Home meds: None LDL 66, goal < 70 No statin for now given advanced age and LDL at goal as well as no po access  AKI on CKD CKD 3, creatinine 1.60-1.39->2.43 CCM on board Avoid nephrotoxic meds  Other Stroke Risk Factors Advanced age PE in 2022 Chronic DVT  Other Active Problems AS s/p bioprosthetic aortic valve    Hospital day # 2  This patient is critically ill due to left MCA stroke, left M1 occlusion status post IR, respiratory failure, massive aspiration pneumonia, severe CHF and chronic A-fib and at significant risk of neurological worsening, death form recurrent stroke, hemorrhagic transmission, heart failure, sepsis, seizure. This patient's care requires constant monitoring of vital signs, hemodynamics, respiratory and cardiac monitoring, review of multiple databases, neurological assessment, discussion with family, other specialists and medical decision making of high complexity. I spent 40 minutes of neurocritical care time in the care of this patient. I had long discussion with daughter and the wife at bedside, updated pt current condition, treatment plan and potential prognosis, and answered all the questions.  They expressed understanding and appreciation.     Rosalin Hawking, MD PhD Stroke Neurology 09/17/2021 1:48 PM    To contact Stroke Continuity provider, please refer to [email or web address] After hours, contact General  Neurology

## 2021-09-17 NOTE — Progress Notes (Signed)
SLP Cancellation Note  Patient Details Name: Eugene Campbell MRN: [id number] DOB: Aug 10, 1940   Cancelled treatment:       Reason Eval/Treat Not Completed: Patient not medically ready - remains on vent. Note events of yesterday including concern for aspiration during vomiting. Will likely check back in next week, but please reach out to our team if needed over the weekend.      Mahala Menghini., M.A. CCC-SLP Acute Rehabilitation Services Office [phone]  09/17/2021, 7:35 AM

## 2021-09-17 NOTE — Progress Notes (Signed)
ANTICOAGULATION CONSULT NOTE - Initial Consult  Pharmacy Consult for IV heparin Indication: atrial fibrillation  Allergies  Allergen Reactions   Penicillins Hives, Swelling, Rash and Other (See Comments)    Peeling skin and caused the gums to peel/crack    Patient Measurements: Height: 6\' 1"  (185.4 cm) Weight: 75.2 kg (165 lb 12.6 oz) IBW/kg (Calculated) : 79.9 Heparin Dosing Weight: 75 kg  Vital Signs: Temp: 98.4 F (36.9 C) (06/16 1940) Temp Source: Oral (06/16 1940) BP: 122/74 (06/16 1600) Pulse Rate: 82 (06/16 1915)  Labs: Recent Labs    09/15/21 1525 09/15/21 1532 09/15/21 1732 09/15/21 1946 09/16/21 0409 09/16/21 1130 09/17/21 0344 09/17/21 0418 09/17/21 2000  HGB 14.5 14.6   < >  --  14.6 16.0 15.6 15.8  --   HCT 43.2 43.0   < >  --  42.9 47.0 46.0 46.3  --   PLT 109*  --   --   --  112*  --   --  104*  --   APTT 31  --   --   --   --   --   --   --   --   LABPROT 14.2  --   --   --   --   --   --   --   --   INR 1.1  --   --   --   --   --   --   --   --   HEPARINUNFRC  --   --   --   --   --   --   --   --  0.42  CREATININE 1.54* 1.60*  --   --  1.39*  --   --  2.43*  --   TROPONINIHS  --   --   --  33*  --   --   --   --   --    < > = values in this interval not displayed.     Estimated Creatinine Clearance: 25.8 mL/min (A) (by C-G formula based on SCr of 2.43 mg/dL (H)).   Medical History: Past Medical History:  Diagnosis Date   Aortic stenosis    Chronic renal impairment, stage 3 (moderate), unspecified whether stage 3a or 3b CKD ([name])    Diabetes ([name])    HFrEF (heart failure with reduced ejection fraction) ([name])    Hypothyroidism    PE (pulmonary thromboembolism) ([name]) 2022    Medications:  Infusions:   sodium chloride Stopped (09/16/21 1548)   cefTRIAXone (ROCEPHIN)  IV Stopped (09/17/21 1516)   heparin 1,000 Units/hr (09/17/21 1700)   norepinephrine (LEVOPHED) Adult infusion Stopped (09/17/21 1647)   vasopressin 0.03 Units/min  (09/17/21 1700)    Assessment: 81 y.o. 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 Presenting with CVA s/p MT. Pharmacy consulted for IV heparin  Received 5000 unit SQ this AM H/H 15.8/46.3, plt 104  Heparin level came back therapeutic tonight. Cont same rate and check confirm in AM.  Goal of Therapy:  Heparin level 0.3-0.7 units/ml Monitor platelets by anticoagulation protocol: Yes   Plan:  Heparin drip at 1000 units/hr Daily heparin level and CBC ordered Monitor for signs/symptoms of bleed  02-08-1990, PharmD, BCIDP, AAHIVP, CPP Infectious Disease Pharmacist 09/17/2021 8:31 PM

## 2021-09-17 NOTE — Progress Notes (Signed)
Inpatient Rehab Admissions Coordinator:   Per therapy recommendations pt was screened for CIR candidacy by Estill Dooms, PT, DPT.  Note pt still requiring mechanical ventilation at this time.  Will rescreen for candidacy once therapy has seen him following extubation.   Estill Dooms, PT, DPT Admissions Coordinator [phone] 09/17/21  2:18 PM

## 2021-09-17 NOTE — Progress Notes (Addendum)
NAME:  Eugene Campbell, MRN:  [id number], DOB:  1940-08-11, LOS: 2 ADMISSION DATE:  09/15/2021, CONSULTATION DATE:  09/15/21 REFERRING MD:  Thomasena Edis CHIEF COMPLAINT:  Vent management   History of Present Illness:  Eugene Campbell is a 81 y.o. 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 He presented to [facility] 6/14 with stroke like symptoms (dysphasia, right hemiplegia) that began suddenly that afternoon after he had been working outside prior. LKN 1430.  CTH demonstrated hyperdense L M1 MCA segment with suspected subtle acute infarct involving left insula. CTA head demonstrated occluded M1 segment of L MCA.  He was taken to IR where he had cerebral angiogram with mechanical thrombectomy with complete recanalization of the L MCA (TICI 3).  Post procedure, he remained ventilated and PCCM was called for assistance with vent management.  Pertinent  Medical History:  has Acute ischemic stroke ([name]); Acute respiratory failure with hypoxia ([name]); Longstanding persistent atrial fibrillation ([name]); and Aspiration pneumonia of both lungs due to gastric secretions ([name]) on their problem list.  Significant Hospital Events: Including procedures, antibiotic start and stop dates in addition to other pertinent events   6/14 admit. 6/15 massive aspiration. Increasing vent settings. Pressors  Interim History / Subjective:  Started on antibiotics and norepinephrine yesterday after aspiration event PICC placed    Objective:  Blood pressure (!) 131/50, pulse 81, temperature 99.5 F (37.5 C), temperature source Oral, resp. rate 14, height 6\' 1"  (1.854 m), weight 75.2 kg, SpO2 100 %.    Vent Mode: PRVC FiO2 (%):  [40 %-100 %] 50 % Set Rate:  [14 bmp] 14 bmp Vt Set:  [630 mL] 630 mL PEEP:  [5 cmH20-10 cmH20] 10 cmH20 Plateau Pressure:  [15 cmH20-23  cmH20] 19 cmH20   Intake/Output Summary (Last 24 hours) at 09/17/2021 0731 Last data filed at 09/17/2021 09/19/2021 Gross per 24 hour  Intake 1677.76 ml  Output 1650 ml  Net 27.76 ml    Filed Weights   09/15/21 1527  Weight: 75.2 kg    Examination:  General: frail elderly male in NAD Neuro: Sedated RASS -2 HEENT: Thorp/AT, PERRL, no JVD Cardiovascular: IRIR no MRG. Rate controlled Lungs: Clear bilateral breath sounds Abdomen: Soft, non-tender, non-distended Musculoskeletal: No acute deformity or edema Skin: Intact, warm, no rashes  Labs/imaging personally reviewed:  [facility] 6/14 > hyperdense L M1 MCA segment with suspected subtle acute infarct involving left insula.  CTA head 6/14 > occluded M1 segment of L MCA.CTH demonstrated hyperdense L M1 MCA segment with suspected subtle acute infarct involving left insula. CTA head demonstrated occluded M1 segment of L MCA. MRI brain 6/15 > patchy acute infarct in the left BG and left medial temporal lobe. MRA brain 6/15 > Recanalized Left MCA M1 segment. Patent left MCA bifurcation. Upstream ICA atherosclerosis and stenosis, and downstream MCA M2 branch stenosis appears unchanged from the CTA yesterday. Echo 6/15 >  LVEF 20-25%, global hypokinesis. Grade 1 DD. Well seated bioprosthetic aortic valve.    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 - Post procedure management per IR - Stroke workup / management per neuro - F/u on echo - ASA  Septic shock secondary to aspiration pneumonia - Rocephin - Norepi for MAP 65 - Continue gentle  hydration  Acute hypoxemic respiratory failure - initially intubated for neuro IR, complicated by witnessed aspiration event 6/16 requiring high PEEP/fio2 - Full vent support. - weaning PEEP and FiO2 for SpO2 90%. - Ceftriaxone. - Bronchial hygiene. - CXR pending.   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  - Will continue to hold oral meds (ileus)  AKI: creatinine worsening. UOP picking up.  Hypocalcemia - Supportive care with gentle fluids. - Follow BMP  Ileus:  - Bowel rest - OGT to low intermittent suction - Now BM as of yet will monitor  Atrial fibrillation Bradycardia - ? 2/2 high dose propofol, now lowered and RN to adjust accordingly. Hx A.fib on Eliquis and Amiodarone, HTN, sCHF and NICM (Echo from Oct 2022 with EF 20-25%) : improved - Holding home amiodarone for now while NPO - OK to restart AC per Dr. Roda Shutters. Heparin per pharmacy  Hx PE and DVT (Oct 2022). - Supportive care.  Hx Hypothyroidism. - Continue home Synthroid (75 daily)   Best practice (evaluated daily):  Per primary  Critical care time:  48 minutes     Joneen Roach, AGACNP-BC [facility]  See Amion for personal pager PCCM on call pager [phone] until 7pm. Please call Elink 7p-7a. [phone]  09/17/2021 7:31 AM

## 2021-09-17 NOTE — TOC Progression Note (Signed)
Transition of Care [facility]) - Initial/Assessment Note    Patient Details  Name: Eugene Campbell MRN: [id number] Date of Birth: 02-17-1941  Transition of Care [facility]) CM/SW Contact:    Ralene Bathe, LCSWA Phone Number: 09/17/2021, 4:31 PM  Clinical Narrative:                  Transition of Care Department [facility]) has reviewed patient.   Patient is still requiring mechanical ventilation at this time.  PT has recommended CIR for the patient at d/c.  CIR is following.  We will continue to monitor patient advancement through interdisciplinary progression rounds.    Patient Goals and CMS Choice        Expected Discharge Plan and Services                                                Prior Living Arrangements/Services                       Activities of Daily Living      Permission Sought/Granted                  Emotional Assessment              Admission diagnosis:  Stroke [facility]) [I63.9] Acute ischemic stroke [facility]) [I63.9] Patient Active Problem List   Diagnosis Date Noted   Protein-calorie malnutrition, severe 09/17/2021   Aspiration pneumonia of both lungs due to gastric secretions ([name])    Acute ischemic stroke ([name]) 09/15/2021   Acute respiratory failure with hypoxia ([name])    Longstanding persistent atrial fibrillation ([name])    PCP:  Susann Givens, PA-C Pharmacy:   [facility] [address], [place] - [address] [place] [address] Phone: [phone] Fax: [phone]     Social Determinants of Health (SDOH) Interventions    Readmission Risk Interventions     No data to display

## 2021-09-17 NOTE — Progress Notes (Signed)
ANTICOAGULATION CONSULT NOTE - Initial Consult  Pharmacy Consult for IV heparin Indication: atrial fibrillation  Allergies  Allergen Reactions   Penicillins Hives, Swelling, Rash and Other (See Comments)    Peeling skin and caused the gums to peel/crack    Patient Measurements: Height: 6\' 1"  (185.4 cm) Weight: 75.2 kg (165 lb 12.6 oz) IBW/kg (Calculated) : 79.9 Heparin Dosing Weight: 75 kg  Vital Signs: Temp: 98.1 F (36.7 C) (06/16 1210) Temp Source: Oral (06/16 1210) BP: 131/50 (06/16 0500) Pulse Rate: 64 (06/16 1045)  Labs: Recent Labs    09/15/21 1525 09/15/21 1532 09/15/21 1732 09/15/21 1946 09/16/21 0409 09/16/21 1130 09/17/21 0344 09/17/21 0418  HGB 14.5 14.6   < >  --  14.6 16.0 15.6 15.8  HCT 43.2 43.0   < >  --  42.9 47.0 46.0 46.3  PLT 109*  --   --   --  112*  --   --  104*  APTT 31  --   --   --   --   --   --   --   LABPROT 14.2  --   --   --   --   --   --   --   INR 1.1  --   --   --   --   --   --   --   CREATININE 1.54* 1.60*  --   --  1.39*  --   --  2.43*  TROPONINIHS  --   --   --  33*  --   --   --   --    < > = values in this interval not displayed.    Estimated Creatinine Clearance: 25.8 mL/min (A) (by C-G formula based on SCr of 2.43 mg/dL (H)).   Medical History: Past Medical History:  Diagnosis Date   Aortic stenosis    Chronic renal impairment, stage 3 (moderate), unspecified whether stage 3a or 3b CKD ([name])    Diabetes ([name])    HFrEF (heart failure with reduced ejection fraction) ([name])    Hypothyroidism    PE (pulmonary thromboembolism) ([name]) 2022    Medications:  Infusions:   sodium chloride Stopped (09/16/21 1548)   cefTRIAXone (ROCEPHIN)  IV Stopped (09/16/21 1548)   fentaNYL infusion INTRAVENOUS 50 mcg/hr (09/17/21 1000)   norepinephrine (LEVOPHED) Adult infusion 15 mcg/min (09/17/21 1208)   propofol (DIPRIVAN) infusion 20 mcg/kg/min (09/17/21 1000)   vasopressin 0.03 Units/min (09/17/21 1000)    Assessment: 81  y.o. 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 Presenting with CVA s/p MT. Pharmacy consulted for IV heparin  Received 5000 unit SQ this AM H/H 15.8/46.3, plt 104  Goal of Therapy:  Heparin level 0.3-0.7 units/ml Monitor platelets by anticoagulation protocol: Yes   Plan:  Heparin drip at 1000 units/hr Heparin level at 2000 Daily heparin level and CBC ordered Monitor for signs/symptoms of bleed  Thank you for allowing pharmacy to be a part of this patient's care.  02-08-1990, PharmD Clinical Pharmacist  Please check AMION for all [facility] Pharmacy numbers After 10:00 PM, call Main Pharmacy [phone]

## 2021-09-17 NOTE — Evaluation (Signed)
Occupational Therapy Evaluation Patient Details Name: Eugene Campbell MRN: [id number] DOB: 09-22-1940 Today's Date: 09/17/2021   History of Present Illness Pt is an 81 y/o male who presented with sudden onset of R sided weakness, facial droop and aphasia. 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 Pt remained intubated after procedure with complications due to acute hypoxic respiratory failure and massive aspiration. PMH: a fib, HTN, PT and DVT, clotting disorder, DM2   Clinical Impression   PTA, pt lives with spouse, typically Independent in all daily tasks and ambulatory without AD. 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 Pt presents now, currently intubated on eval but alert and following commands. Pt with overall good UB/LB strength with minor R shoulder deficits noted. Pt able to sit EOB and complete transfer to chair with handheld assist and light Mod A x 2 for safety. Will have a better idea of ADL functional abilities as lines continue to be removed but currently requiring Mod A for UB and Max A for LB ADLs. Based on excellent rehab potential and high PLOF, recommend AIR level therapies at DC.   VSS on PRVC 40% FiO2, PEEP 5      Recommendations for follow up therapy are one component of a multi-disciplinary discharge planning process, led by the attending physician.  Recommendations may be updated based on patient status, additional functional criteria and insurance authorization.   Follow Up Recommendations  Acute inpatient rehab (3hours/day)    Assistance Recommended at Discharge Intermittent Supervision/Assistance  Patient can return home with the following      Functional Status Assessment  Patient has had a recent decline in their functional status and demonstrates the ability to make significant improvements in function in a reasonable and predictable amount  of time.  Equipment Recommendations  Other (comment) (TBD pending progress)    Recommendations for Other Services Rehab consult     Precautions / Restrictions Precautions Precautions: Fall;Other (comment) Precaution Comments: monitor vitals, NGT, A line Restrictions Weight Bearing Restrictions: No      Mobility Bed Mobility Overal bed mobility: Needs Assistance Bed Mobility: Rolling, Sidelying to Sit Rolling: Mod assist Sidelying to sit: Mod assist, +2 for safety/equipment, HOB elevated       General bed mobility comments: Tactile cues to initiate with pt able to hold to bed rail. assist to get BLE off of bed with pt assisting to lift trunk.    Transfers Overall transfer level: Needs assistance Equipment used: 1 person hand held assist, 2 person hand held assist Transfers: Sit to/from Stand, Bed to chair/wheelchair/BSC Sit to Stand: Min assist     Step pivot transfers: Mod assist, +2 safety/equipment     General transfer comment: able to stand with light handheld assist to achieve upright posture. Light Mod A x2 for safety/lines to step to recliner with verbal cues.      Balance Overall balance assessment: Needs assistance Sitting-balance support: Feet supported, No upper extremity supported Sitting balance-Leahy Scale: Fair Sitting balance - Comments: mild posterior sway with LE exercises EOB but able to maintain sitting balance without challenges   Standing balance support: Single extremity supported, During functional activity Standing balance-Leahy Scale: Fair Standing balance comment: able to statically stand, benefits from UE support for dynamic tasks  ADL either performed or assessed with clinical judgement   ADL Overall ADL's : Needs assistance/impaired Eating/Feeding: NPO   Grooming: Minimal assistance;Sitting;Wash/dry face Grooming Details (indicate cue type and reason): able to reach for washcloth to wipe  noise/around mouth Upper Body Bathing: Moderate assistance;Sitting   Lower Body Bathing: Maximal assistance;Sit to/from stand   Upper Body Dressing : Minimal assistance;Sitting   Lower Body Dressing: Maximal assistance;Sit to/from stand   Toilet Transfer: Moderate assistance;+2 for safety/equipment;Stand-pivot;BSC/3in1   Toileting- Clothing Manipulation and Hygiene: Maximal assistance;Sitting/lateral lean;Sit to/from stand         General ADL Comments: Pt with good overall strength, fair sitting balance and able to progress to chair. Follows commands consistently - will have better idea of ADL assist levels once more lines removed     Vision Baseline Vision/History: 1 Wears glasses (reading) Ability to See in Adequate Light: 0 Adequate Patient Visual Report: Other (comment) (to be further assessed when pt extubated) Vision Assessment?: No apparent visual deficits     Perception     Praxis      Pertinent Vitals/Pain Pain Assessment Pain Assessment: No/denies pain     Hand Dominance Right   Extremity/Trunk Assessment Upper Extremity Assessment Upper Extremity Assessment: RUE deficits/detail RUE Deficits / Details: 4-/5 shoulder mildly weaker than L side. 4+/5 biceps/triceps/grip strength   Lower Extremity Assessment Lower Extremity Assessment: Defer to PT evaluation   Cervical / Trunk Assessment Cervical / Trunk Assessment: Normal   Communication Communication Communication: No difficulties   Cognition Arousal/Alertness: Awake/alert Behavior During Therapy: WFL for tasks assessed/performed, Impulsive Overall Cognitive Status: Difficult to assess                                 General Comments: Does follow one step commands consistently, able to give thumbs up/down for questions. mildly impulsive and attempting to stand without assist but eager to participate     General Comments  Wife and stepdaughter present, very supportive    Exercises      Shoulder Instructions      Home Living Family/patient expects to be discharged to:: Private residence Living Arrangements: Spouse/significant other Available Help at Discharge: Family Type of Home: Mobile home Home Access: Stairs to enter;Ramped entrance Entrance Stairs-Number of Steps: 5 steps at front and ramp at back   Home Layout: One level     Bathroom Shower/Tub: Chief Strategy Officer: Standard     Home Equipment: Rollator (4 wheels);Cane - single point;Shower seat;Wheelchair - power   Additional Comments: DME belongs to pt's wife      Prior Functioning/Environment Prior Level of Function : Independent/Modified Independent             Mobility Comments: no use of AD ADLs Comments: Per wife, pt "hard worker"; Works in yard, Animator Problem List: Decreased strength;Decreased activity tolerance;Impaired balance (sitting and/or standing);Cardiopulmonary status limiting activity      OT Treatment/Interventions: Self-care/ADL training;Therapeutic exercise;DME and/or AE instruction;Therapeutic activities    OT Goals(Current goals can be found in the care plan section) Acute Rehab OT Goals Patient Stated Goal: pt wanted to get up in chair and get extubated soon. Family open to rehab OT Goal Formulation: With patient/family Time For Goal Achievement: 10/01/21 Potential to Achieve Goals: Fair  OT Frequency: Min 2X/week    Co-evaluation PT/OT/SLP Co-Evaluation/Treatment: Yes Reason for Co-Treatment: Complexity of the patient's impairments (multi-system involvement);For patient/therapist  safety;To address functional/ADL transfers   OT goals addressed during session: ADL's and self-care;Strengthening/ROM      AM-PAC OT "6 Clicks" Daily Activity     Outcome Measure Help from another person eating meals?: Total Help from another person taking care of personal grooming?: A Lot Help from another person toileting, which includes using toliet,  bedpan, or urinal?: A Lot Help from another person bathing (including washing, rinsing, drying)?: A Lot Help from another person to put on and taking off regular upper body clothing?: A Little Help from another person to put on and taking off regular lower body clothing?: A Lot 6 Click Score: 12   End of Session Equipment Utilized During Treatment: Other (comment) (vent) Nurse Communication: Mobility status;Other (comment) (RN present for transfer)  Activity Tolerance: Patient tolerated treatment well Patient left: in chair;with call bell/phone within reach;with chair alarm set;with family/visitor present;with nursing/sitter in room  OT Visit Diagnosis: Other abnormalities of gait and mobility (R26.89);Muscle weakness (generalized) (M62.81)                Time: 1517-6160 OT Time Calculation (min): 35 min Charges:  OT General Charges $OT Visit: 1 Visit OT Evaluation $OT Eval High Complexity: 1 High  Bradd Canary, OTR/L Acute Rehab Services Office: [phone]   Lorre Munroe 09/17/2021, 1:55 PM

## 2021-09-17 NOTE — Progress Notes (Signed)
Brief Nutrition Note  Discussed pt with RN and during ICU rounds. Massive episode of emesis yesterday and KUB showed gas-filled bowel loops throughout abdomen and pelvis and possible dilated small bowel. Concern for ileus. Pt has not yet had a BM. Per discussion with CCM today, continue bowel rest and continue OG tube to LIWS. No plans to start tube feeds today.  Plan is for abdominal x-ray tomorrow morning. Based on results, CCM may consider initiation of trickle tube feeds at that time. If unable to initiate tube feeds within next 24 hours and within GOC, recommend considering initiation of TPN given severity of malnutrition.  If able to initiate trickle tube feeds, recommend: - Osmolite 1.5 @ 20 ml/hr (480 ml/day)  Goal tube feeding regimen: - Osmolite 1.5 @ 50 ml/hr (1200 ml/day) - ProSource TF 45 ml BID  Recommended tube feeding regimen at goal rate would provide 1880 kcal, 97 grams of protein, and 914 ml of H2O.   RD will continue to follow pt during admission.   Mertie Clause, MS, RD, LDN Inpatient Clinical Dietitian Please see AMiON for contact information.

## 2021-09-17 NOTE — Evaluation (Signed)
Physical Therapy Evaluation Patient Details Name: Eugene Campbell MRN: [id number] DOB: 1940/09/23 Today's Date: 09/17/2021  History of Present Illness  Pt is an 81 y/o male who presented with sudden onset of R sided weakness, facial droop and aphasia. 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 Pt remained intubated after procedure with complications due to acute hypoxic respiratory failure and massive aspiration. PMH: a fib, HTN, PT and DVT, clotting disorder, DM2  Clinical Impression  Pt admitted with/for stroke and attained canalization at [facility] level in IR.  Pt has shown to make significant improvements, now moving all 4 ext and needing min to mod assist for basic mobility/transfers..  Pt currently limited functionally due to the problems listed. ( See problems list.)   Pt will benefit from PT to maximize function and safety in order to get ready for next venue listed below.        Recommendations for follow up therapy are one component of a multi-disciplinary discharge planning process, led by the attending physician.  Recommendations may be updated based on patient status, additional functional criteria and insurance authorization.  Follow Up Recommendations Acute inpatient rehab (3hours/day)    Assistance Recommended at Discharge Frequent or constant Supervision/Assistance  Patient can return home with the following  A little help with walking and/or transfers;A little help with bathing/dressing/bathroom;Assistance with cooking/housework;Assist for transportation;Help with stairs or ramp for entrance    Equipment Recommendations Other (comment) (TBA)  Recommendations for Other Services  Rehab consult    Functional Status Assessment Patient has had a recent decline in their functional status and demonstrates the ability to make significant improvements in function in a reasonable and  predictable amount of time.     Precautions / Restrictions Precautions Precautions: Fall;Other (comment) Precaution Comments: monitor vitals, NGT, A line Restrictions Weight Bearing Restrictions: No      Mobility  Bed Mobility Overal bed mobility: Needs Assistance Bed Mobility: Rolling, Sidelying to Sit Rolling: Mod assist Sidelying to sit: Mod assist, +2 for safety/equipment, HOB elevated       General bed mobility comments: Tactile cues to initiate with pt able to hold to bed rail. assist to get BLE off of bed with pt assisting to lift trunk.    Transfers Overall transfer level: Needs assistance Equipment used: 1 person hand held assist, 2 person hand held assist Transfers: Sit to/from Stand, Bed to chair/wheelchair/BSC Sit to Stand: Min assist   Step pivot transfers: Mod assist, +2 safety/equipment       General transfer comment: able to stand with light handheld assist to achieve upright posture. Light Mod A x2 for safety/lines to step to recliner with verbal cues.    Ambulation/Gait               General Gait Details: step pivot to chair with small, unsteady steps and light moderate assist  Stairs            Wheelchair Mobility    Modified Rankin (Stroke Patients Only) Modified Rankin (Stroke Patients Only) Pre-Morbid Rankin Score: No symptoms Modified Rankin: Moderately severe disability     Balance Overall balance assessment: Needs assistance Sitting-balance support: Feet supported, No upper extremity supported Sitting balance-Leahy Scale: Fair Sitting balance - Comments: mild posterior sway with LE exercises EOB but able to maintain sitting balance without challenges   Standing balance support: Single extremity supported, During functional activity Standing balance-Leahy Scale: Fair (to poor)  Standing balance comment: able to statically stand, benefits from UE support for dynamic tasks                             Pertinent  Vitals/Pain Pain Assessment Pain Assessment: No/denies pain    Home Living Family/patient expects to be discharged to:: Private residence Living Arrangements: Spouse/significant other Available Help at Discharge: Family Type of Home: Mobile home Home Access: Stairs to enter;Ramped entrance   Entrance Stairs-Number of Steps: 5 steps at front and ramp at back   Home Layout: One level Home Equipment: Rollator (4 wheels);Cane - single point;Shower seat;Wheelchair - power Additional Comments: DME belongs to pt's wife    Prior Function Prior Level of Function : Independent/Modified Independent             Mobility Comments: no use of AD ADLs Comments: 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 4+/5 biceps/triceps/grip strength    Lower Extremity Assessment Lower Extremity Assessment: RLE deficits/detail;LLE deficits/detail RLE Deficits / Details: Excellently improved, can move weakly in isolated movments grossly 4/5 LLE Deficits / Details: generally functional and strong at 4+/5    Cervical / Trunk Assessment Cervical / Trunk Assessment: Normal  Communication   Communication: No difficulties  Cognition Arousal/Alertness: Awake/alert Behavior During Therapy: WFL for tasks assessed/performed, Impulsive Overall Cognitive Status: Difficult to assess                                 General Comments: Does follow one step commands consistently, able to give thumbs up/down for questions. mildly impulsive and attempting to stand without assist but eager to participate        General Comments General comments (skin integrity, edema, etc.): Wife and stepdaughter present, very supportive    Exercises     Assessment/Plan    PT Assessment  Patient needs continued PT services  PT Problem List Decreased strength;Decreased activity tolerance;Decreased balance;Decreased mobility;Decreased coordination       PT Treatment Interventions Gait training;Functional mobility training;Therapeutic activities;Balance training;Neuromuscular re-education;Patient/family education;DME instruction    PT Goals (Current goals can be found in the Care Plan section)  Acute Rehab PT Goals Patient Stated Goal: pt unable due to intubated, wife wants pt to be active as usual. PT Goal Formulation: With patient/family Time For Goal Achievement: 10/01/21 Potential to Achieve Goals: Good    Frequency Min 3X/week     Co-evaluation PT/OT/SLP Co-Evaluation/Treatment: Yes Reason for Co-Treatment: Complexity of the patient's impairments (multi-system involvement) PT goals addressed during session: Mobility/safety with mobility OT goals addressed during session: ADL's and self-care;Strengthening/ROM       AM-PAC PT "6 Clicks" Mobility  Outcome Measure Help needed turning from your back to your side while in a flat bed without using bedrails?: A Lot Help needed moving from lying on your back to sitting on the side of a flat bed without using bedrails?: A Lot Help needed moving to and from a bed to a chair (including a wheelchair)?: A Lot Help needed standing up from a chair using your arms (e.g., wheelchair or bedside chair)?: A Little Help needed to walk in hospital room?: Total Help needed climbing 3-5 steps with  a railing? : Total 6 Click Score: 11    End of Session Equipment Utilized During Treatment: Oxygen Activity Tolerance: Patient tolerated treatment well Patient left: in chair;with call bell/phone within reach;with chair alarm set;with family/visitor present Nurse Communication: Mobility status PT Visit Diagnosis: Other abnormalities of gait and mobility (R26.89);Difficulty in walking, not elsewhere classified (R26.2);Other symptoms and  signs involving the nervous system (R29.898)    Time: [id number] PT Time Calculation (min) (ACUTE ONLY): 37 min   Charges:   PT Evaluation $PT Eval Moderate Complexity: 1 Mod          09/17/2021  Ginger Carne., PT Acute Rehabilitation Services [phone]  (pager) [phone]  (office)  Tessie Fass Makinsey Pepitone 09/17/2021, 2:16 PM

## 2021-09-18 ENCOUNTER — Inpatient Hospital Stay (HOSPITAL_COMMUNITY): Payer: Medicare HMO

## 2021-09-18 DIAGNOSIS — I639 Cerebral infarction, unspecified: Secondary | ICD-10-CM | POA: Diagnosis not present

## 2021-09-18 LAB — COMPREHENSIVE METABOLIC PANEL
ALT: 31 U/L (ref 0–44)
AST: 28 U/L (ref 15–41)
Albumin: 2.2 g/dL — ABNORMAL LOW (ref 3.5–5.0)
Alkaline Phosphatase: 78 U/L (ref 38–126)
Anion gap: 12 (ref 5–15)
BUN: 40 mg/dL — ABNORMAL HIGH (ref 8–23)
CO2: 22 mmol/L (ref 22–32)
Calcium: 7.8 mg/dL — ABNORMAL LOW (ref 8.9–10.3)
Chloride: 98 mmol/L (ref 98–111)
Creatinine, Ser: 2.12 mg/dL — ABNORMAL HIGH (ref 0.61–1.24)
GFR, Estimated: 31 mL/min — ABNORMAL LOW (ref >60)
Glucose, Bld: 110 mg/dL — ABNORMAL HIGH (ref 70–99)
Potassium: 5.3 mmol/L — ABNORMAL HIGH (ref 3.5–5.1)
Sodium: 132 mmol/L — ABNORMAL LOW (ref 135–145)
Total Bilirubin: 0.7 mg/dL (ref 0.3–1.2)
Total Protein: 5.1 g/dL — ABNORMAL LOW (ref 6.5–8.1)

## 2021-09-18 LAB — PHOSPHORUS: Phosphorus: 5.8 mg/dL — ABNORMAL HIGH (ref 2.5–4.6)

## 2021-09-18 LAB — GLUCOSE, CAPILLARY
Glucose-Capillary: 115 mg/dL — ABNORMAL HIGH (ref 70–99)
Glucose-Capillary: 102 mg/dL — ABNORMAL HIGH (ref 70–99)
Glucose-Capillary: 111 mg/dL — ABNORMAL HIGH (ref 70–99)
Glucose-Capillary: 93 mg/dL (ref 70–99)
Glucose-Capillary: 71 mg/dL (ref 70–99)

## 2021-09-18 LAB — MAGNESIUM: Magnesium: 2.4 mg/dL (ref 1.7–2.4)

## 2021-09-18 LAB — CBC
HCT: 40.5 % (ref 39.0–52.0)
Hemoglobin: 13.9 g/dL (ref 13.0–17.0)
MCH: 33.2 pg (ref 26.0–34.0)
MCHC: 34.3 g/dL (ref 30.0–36.0)
MCV: 96.7 fL (ref 80.0–100.0)
Platelets: 73 10*3/uL — ABNORMAL LOW (ref 150–400)
RBC: 4.19 MIL/uL — ABNORMAL LOW (ref 4.22–5.81)
RDW: 13.9 % (ref 11.5–15.5)
WBC: 10.9 10*3/uL — ABNORMAL HIGH (ref 4.0–10.5)
nRBC: 0 % (ref 0.0–0.2)

## 2021-09-18 LAB — HEPARIN LEVEL (UNFRACTIONATED): Heparin Unfractionated: 0.41 IU/mL (ref 0.30–0.70)

## 2021-09-18 MED ORDER — PANTOPRAZOLE 2 MG/ML SUSPENSION
40.0000 mg | Freq: Every day | ORAL | Status: DC
Start: 2021-09-18 — End: 2021-09-19
  Administered 2021-09-18: 40 mg
  Filled 2021-09-18: qty 20

## 2021-09-18 MED ORDER — ORAL CARE MOUTH RINSE
15.0000 mL | OROMUCOSAL | Status: DC | PRN
  Administered 2021-09-20: 15 mL via OROMUCOSAL

## 2021-09-18 MED ORDER — METOPROLOL TARTRATE 25 MG/10 ML ORAL SUSPENSION
12.5000 mg | Freq: Two times a day (BID) | ORAL | Status: DC
  Administered 2021-09-18: 12.5 mg
  Filled 2021-09-18: qty 10

## 2021-09-18 MED ORDER — DEXTROSE 5 % IV SOLN: INTRAVENOUS | Status: AC

## 2021-09-18 MED ORDER — POLYETHYLENE GLYCOL 3350 17 G PO PACK
17.0000 g | PACK | Freq: Every day | ORAL | Status: DC
  Administered 2021-09-18: 17 g
  Filled 2021-09-18: qty 1

## 2021-09-18 MED ORDER — SODIUM ZIRCONIUM CYCLOSILICATE 5 G PO PACK
5.0000 g | PACK | Freq: Once | ORAL | Status: AC
  Administered 2021-09-18: 5 g
  Filled 2021-09-18: qty 1

## 2021-09-18 MED ORDER — SENNOSIDES 8.8 MG/5ML PO SYRP
10.0000 mL | ORAL_SOLUTION | Freq: Two times a day (BID) | ORAL | Status: DC
Start: 2021-09-18 — End: 2021-09-19
  Administered 2021-09-18: 10 mL
  Filled 2021-09-18: qty 10

## 2021-09-18 MED ORDER — BISACODYL 10 MG RE SUPP
10.0000 mg | Freq: Every day | RECTAL | Status: DC | PRN
Start: 2021-09-18 — End: 2021-09-22

## 2021-09-18 MED ORDER — LEVOTHYROXINE SODIUM 75 MCG PO TABS
75.0000 ug | ORAL_TABLET | Freq: Every day | ORAL | Status: DC
Start: 2021-09-18 — End: 2021-09-19
  Administered 2021-09-18: 75 ug
  Filled 2021-09-18: qty 1

## 2021-09-18 NOTE — Progress Notes (Signed)
NAME:  Eugene Campbell, MRN:  [id number], DOB:  10-03-1940, LOS: 3 ADMISSION DATE:  09/15/2021, CONSULTATION DATE:  6/14 REFERRING MD:  Thomasena Edis, CHIEF COMPLAINT:  Stroke   History of Present Illness:  81 y/o male who presented to [facility] on 6/14 due to R hemiplegia due to a L M1 MCA occlusion, intubated for the procedure and vomited and aspirated the day after the procedure while still intubated.  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  CTA head 6/14 > occluded M1 segment of L MCA.CTH demonstrated hyperdense L M1 MCA segment with suspected subtle acute infarct involving left insula. CTA head demonstrated occluded M1 segment of L MCA. MRI brain 6/15 > patchy acute infarct in the left BG and left medial temporal lobe. MRA brain 6/15 > Recanalized Left MCA M1 segment. Patent left MCA bifurcation. Upstream ICA atherosclerosis and stenosis, and downstream MCA M2 branch stenosis appears unchanged from the CTA yesterday. Echo 6/15 >  LVEF 20-25%, global hypokinesis. Grade 1 DD. Well seated bioprosthetic aortic valve.  6/15 massive aspiration. Increasing vent settings. Pressors  Interim History / Subjective:  Awake on vent this morning but frequent periods of apnea Follows commands Out of bed to chair yesterday Off vasopressors  Objective   Blood pressure 122/74, pulse 72, temperature 98.6 F (37 C), temperature source Oral, resp. rate 18, height 6\' 1"  (1.854 m), weight 75.2 kg, SpO2 100 %.    Vent Mode: PRVC FiO2 (%):  [40 %] 40 % Set Rate:  [18 bmp] 18 bmp Vt Set:  [630 mL] 630 mL PEEP:  [5 cmH20-8 cmH20] 5 cmH20 Pressure Support:  [5 cmH20] 5 cmH20 Plateau  Pressure:  [8 cmH20-19 cmH20] 17 cmH20   Intake/Output Summary (Last 24 hours) at 09/18/2021 0737 Last data filed at 09/18/2021 0600 Gross per 24 hour  Intake 1213.4 ml  Output 1100 ml  Net 113.4 ml   Filed Weights   09/15/21 1527  Weight: 75.2 kg    Examination:  General:  In bed on vent HENT: NCAT ETT in place PULM: CTA B, vent supported breathing CV: RRR, no mgr GI: BS+, soft, nontender MSK: normal bulk and tone Neuro: awake, follows commands, moves all four extremities   Resolved Hospital Problem list     Assessment & Plan:  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 Secondary stroke prevention, hypertension and glucose parameters per stroke service Statin to continue  Septic shock from aspiration pneumonia, organism unspecified> resolved Continue ceftriaxone 5 day course Monitor hemodynamics  Acute hypoxemic respiratory failure  Cheyne Stoke's breathing on 6/17, likely due to HFrEF, not passing SBT due to apnea Full mechanical vent support > change to SIMV VAP prevention Daily WUA/SBT Discuss with family, may consider extubation to NIMV today  Chronic HFrEF, not on b-blocker or ACE at baseline Tele Add low dose metoprolol  AKI > improved Monitor BMET and UOP Replace electrolytes as needed  Ileus> persists, improved gaseous distension of colon/small bowel Start bowel regimen Resume tube feeding   Atrial fibrillation Tele Heparin infusion  PE and DVT 01/2021 Heparin in fusion  Hypothyroidism Synthroid > resume  Best Practice (right click and "Reselect all SmartList Selections" daily)   Diet/type: tubefeeds  DVT prophylaxis:  systemic heparin GI prophylaxis: PPI Lines: Central line and yes and it is still needed Foley:  N/A Code Status:  full code Last date of multidisciplinary goals of care discussion [per primary]  Critical care time: 35 minutes    Heber [place], MD [place] [facility] Pager: [phone] Cell:  [phone] After 7:00 pm call Elink  [phone]

## 2021-09-18 NOTE — Progress Notes (Signed)
eLink Physician-Brief Progress Note Patient Name: Eugene Campbell DOB: 29-Apr-1940 MRN: [id number]   Date of Service  09/18/2021  HPI/Events of Note  K+ 5.3  eICU Interventions  Lokelma 5 gm po x 1 ordered.        Thomasene Lot Marchelle Rinella 09/18/2021, 5:54 AM

## 2021-09-18 NOTE — Progress Notes (Signed)
RestartSTROKE TEAM PROGRESS NOTE   SUBJECTIVE (INTERVAL HISTORY) His RN, wife and respiratory therapist are at the bedside.  Pt was just extubated earlier today.  He is currently on BiPAP.  He is drowsy but able to open eyes on voice and able to following most simple commands, moving all extremities symmetrically.  Vital signs stable.  Neurological exam is unchanged  OBJECTIVE Temp:  [97.8 F (36.6 C)-98.6 F (37 C)] 97.9 F (36.6 C) (06/17 1124) Pulse Rate:  [59-90] 59 (06/17 1044) Cardiac Rhythm: Atrial fibrillation (06/17 0716) Resp:  [16-25] 25 (06/17 1044) BP: (122-143)/(64-74) 141/64 (06/17 1044) SpO2:  [93 %-100 %] 96 % (06/17 1044) Arterial Line BP: (96-216)/(46-211) 135/63 (06/17 0615) FiO2 (%):  [40 %] 40 % (06/17 1044)  Recent Labs  Lab 09/17/21 1937 09/17/21 2347 09/18/21 0336 09/18/21 0713 09/18/21 1121  GLUCAP 114* 115* 115* 102* 111*   Recent Labs  Lab 09/15/21 1525 09/15/21 1532 09/15/21 1732 09/15/21 1946 09/16/21 0409 09/16/21 1130 09/17/21 0344 09/17/21 0418 09/18/21 0345  NA 137 136   < >  --  136 137 132* 134* 132*  K 4.8 4.6   < >  --  4.6 3.9 5.1 5.1 5.3*  CL 101 99  --   --  103  --   --  103 98  CO2 27  --   --   --  23  --   --  22 22  GLUCOSE 113* 104*  --   --  161*  --   --  144* 110*  BUN 15 18  --   --  14  --   --  31* 40*  CREATININE 1.54* 1.60*  --   --  1.39*  --   --  2.43* 2.12*  CALCIUM 8.6*  --   --   --  8.9  --   --  7.7* 7.8*  MG  --   --   --  1.9  --   --   --  2.4 2.4  PHOS  --   --   --  2.2*  --   --   --  7.2* 5.8*   < > = values in this interval not displayed.   Recent Labs  Lab 09/15/21 1525 09/17/21 0418 09/18/21 0345  AST 34 31 28  ALT 28 41 31  ALKPHOS 94 82 78  BILITOT 0.8 0.9 0.7  PROT 5.9* 5.3* 5.1*  ALBUMIN 3.2* 2.6* 2.2*   Recent Labs  Lab 09/15/21 1525 09/15/21 1532 09/16/21 0409 09/16/21 1130 09/17/21 0344 09/17/21 0418 09/18/21 0345  WBC 7.8  --  11.9*  --   --  7.0 10.9*  NEUTROABS  4.2  --   --   --   --  4.8  --   HGB 14.5   < > 14.6 16.0 15.6 15.8 13.9  HCT 43.2   < > 42.9 47.0 46.0 46.3 40.5  MCV 98.6  --  95.5  --   --  97.3 96.7  PLT 109*  --  112*  --   --  104* 73*   < > = values in this interval not displayed.   No results for input(s): "CKTOTAL", "CKMB", "CKMBINDEX", "TROPONINI" in the last 168 hours. Recent Labs    09/15/21 1525  LABPROT 14.2  INR 1.1   Recent Labs    09/16/21 0251  COLORURINE YELLOW  LABSPEC 1.030  PHURINE 5.0  GLUCOSEU >=500*  HGBUR NEGATIVE  BILIRUBINUR NEGATIVE  KETONESUR NEGATIVE  PROTEINUR NEGATIVE  NITRITE NEGATIVE  LEUKOCYTESUR NEGATIVE       Component Value Date/Time   CHOL 133 09/16/2021 0409   TRIG 38 09/16/2021 0410   HDL 59 09/16/2021 0409   CHOLHDL 2.3 09/16/2021 0409   VLDL 8 09/16/2021 0409   LDLCALC 66 09/16/2021 0409   Lab Results  Component Value Date   HGBA1C 5.9 (H) 09/15/2021   No results found for: "LABOPIA", "COCAINSCRNUR", "LABBENZ", "AMPHETMU", "THCU", "LABBARB"  Recent Labs  Lab 09/15/21 1946  ETH <10    I have personally reviewed the radiological images below and agree with the radiology interpretations.  DG Abd Portable 1V  Result Date: 09/18/2021 CLINICAL DATA:  Abdominal distention. EXAM: PORTABLE ABDOMEN - 1 VIEW COMPARISON:  09/16/2021. FINDINGS: Flat plate study in 2 films at 4:54 a.m., 09/18/2021. There is increased opacity in the left lung base consistent with consolidation, atelectasis or aspiration. Enteric catheter has been advanced with tip now superimposing over the left iliac crest. If the patient has had gastric bypass with gastrojejunostomy the tube is probably in a jejunal segment. Postsurgical changes are again noted in the left and right upper quadrant. There is improved gaseous distention of central abdominal large bowel segments. Currently no dilated small bowel is seen. Retained fecal burden appears somewhat improved particularly in the ascending and descending  colon. Colonic gas remains present through into the rectum. Visceral shadows are stable. There is no supine evidence of free air. In all other respects there are no further changes. IMPRESSION: 1. Increasingly dense consolidation in the left lower lobe consistent with atelectasis, pneumonia or aspiration. 2. Improved colonic distention, currently with no small bowel dilatation being seen. 3. Interval advancement of the enteric tube with the tip superimposing over the left iliac crest. If there has been a gastric bypass, the tip of the tube is probably in a jejunal segment past the gastrojejunostomy. Electronically Signed   By: Telford Nab M.D.   On: 09/18/2021 07:07   DG Chest Port 1 View  Result Date: 09/17/2021 CLINICAL DATA:  Hypoxia. 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 Endotracheal tube terminates at the level of the clavicles, well above the carina. Right PICC terminates over the SVC, unchanged. Enteric tube courses into the abdomen with tip not imaged. The cardiac silhouette remains mildly enlarged. Left perihilar and left lower lobe opacities are unchanged. No sizable pleural effusion or pneumothorax is identified. IMPRESSION: Unchanged left perihilar and lower lobe airspace disease concerning for pneumonia. Electronically Signed   By: Logan Bores M.D.   On: 09/17/2021 08:54   ECHOCARDIOGRAM COMPLETE  Result Date: 09/16/2021    ECHOCARDIOGRAM REPORT   Patient Name:   Eugene Campbell Date of Exam: 09/16/2021 Medical Rec #:  [id number]      Height:       73.0 in Accession #:    [id number]     Weight:       165.8 lb Date of Birth:  1940-05-31      BSA:          1.987 m Patient Age:    72 years       BP:           102/60 mmHg Patient Gender: M              HR:           63 bpm. Exam Location:  Inpatient Procedure: 2D  Echo, Cardiac Doppler and Color Doppler Indications:    Stroke  History:        Patient  has no prior history of Echocardiogram examinations.                 Aortic Valve Disease, Arrythmias:Atrial Fibrillation; Risk                 Factors:Former Smoker and Diabetes. AVR. Acute respiratory                 failure with hypoxia. HFrEF.                 Aortic Valve: valve is present in the aortic position. Procedure                 Date: unknown.  Sonographer:    Clayton Lefort RDCS (AE) Referring Phys: [id number] [place]  Sonographer Comments: Echo performed with patient supine and on artificial respirator. Unable to use Definity due to lack of line access per nurse. IMPRESSIONS  1. Left ventricular ejection fraction, by estimation, is 20 to 25%. The left ventricle has severely decreased function. The left ventricle demonstrates global hypokinesis. Left ventricular diastolic parameters are consistent with Grade I diastolic dysfunction (impaired relaxation).  2. Right ventricular systolic function is mildly reduced. The right ventricular size is mildly enlarged.  3. Left atrial size was mildly dilated.  4. Right atrial size was severely dilated.  5. The mitral valve is normal in structure. No evidence of mitral valve regurgitation. No evidence of mitral stenosis.  6. 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 The aortic valve has been repaired/replaced. Aortic valve regurgitation is not visualized. No aortic stenosis is present. There is a valve present in the aortic position. Procedure Date: unknown. Aortic valve mean gradient measures 8.0 mmHg. Aortic valve Vmax measures 2.00 m/s.  7. The inferior vena cava is normal in size with greater than 50% respiratory variability, suggesting right atrial pressure of 3 mmHg. FINDINGS  Left Ventricle: Left ventricular ejection fraction, by estimation, is 20 to 25%. The left ventricle has severely decreased function. The left ventricle demonstrates global hypokinesis. The left ventricular internal cavity size was normal in size.  There is no left ventricular hypertrophy. Left ventricular diastolic parameters are consistent with Grade I diastolic dysfunction (impaired relaxation). Right Ventricle: The right ventricular size is mildly enlarged. No increase in right ventricular wall thickness. Right ventricular systolic function is mildly reduced. Left Atrium: Left atrial size was mildly dilated. Right Atrium: Right atrial size was severely dilated. Pericardium: There is no evidence of pericardial effusion. Mitral Valve: The mitral valve is normal in structure. Mild to moderate mitral annular calcification. No evidence of mitral valve regurgitation. No evidence of mitral valve stenosis. Tricuspid Valve: The tricuspid valve is normal in structure. Tricuspid valve regurgitation is not demonstrated. No evidence of tricuspid stenosis. 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 The aortic valve has been repaired/replaced. Aortic valve regurgitation is not visualized. No aortic stenosis is present. Aortic valve mean gradient measures 8.0 mmHg. Aortic valve peak gradient measures 16.0 mmHg. Aortic valve area, by VTI measures 1.10 cm. There is a valve present in the aortic position. Procedure Date: unknown. Pulmonic Valve: The pulmonic valve was not well visualized. Pulmonic valve regurgitation is trivial. No evidence of pulmonic stenosis. Aorta: The aortic root is normal in size and structure. Venous: The inferior vena cava is normal in size with  greater than 50% respiratory variability, suggesting right atrial pressure of 3 mmHg. IAS/Shunts: No atrial level shunt detected by color flow Doppler.  LEFT VENTRICLE PLAX 2D LVIDd:         5.30 cm      Diastology LVIDs:         4.90 cm      LV e' medial:    4.13 cm/s LV PW:         1.20 cm      LV E/e' medial:  19.8 LV IVS:        2.00 cm      LV e' lateral:   9.14 cm/s LVOT diam:     2.00 cm      LV E/e' lateral: 8.9 LV SV:         38 LV SV Index:   19 LVOT  Area:     3.14 cm  LV Volumes (MOD) LV vol d, MOD A2C: 158.0 ml LV vol d, MOD A4C: 170.0 ml LV vol s, MOD A2C: 94.3 ml LV vol s, MOD A4C: 139.0 ml LV SV MOD A2C:     63.7 ml LV SV MOD A4C:     170.0 ml LV SV MOD BP:      53.6 ml RIGHT VENTRICLE RV Basal diam:  3.20 cm RV S prime:     6.74 cm/s TAPSE (M-mode): 0.8 cm LEFT ATRIUM             Index        RIGHT ATRIUM           Index LA diam:        3.90 cm 1.96 cm/m   RA Area:     25.90 cm LA Vol (A2C):   66.4 ml 33.42 ml/m  RA Volume:   85.90 ml  43.24 ml/m LA Vol (A4C):   56.6 ml 28.49 ml/m LA Biplane Vol: 66.5 ml 33.47 ml/m  AORTIC VALVE AV Area (Vmax):    1.20 cm AV Area (Vmean):   1.25 cm AV Area (VTI):     1.10 cm AV Vmax:           200.00 cm/s AV Vmean:          131.000 cm/s AV VTI:            0.349 m AV Peak Grad:      16.0 mmHg AV Mean Grad:      8.0 mmHg LVOT Vmax:         76.40 cm/s LVOT Vmean:        52.300 cm/s LVOT VTI:          0.122 m LVOT/AV VTI ratio: 0.35  AORTA Ao Root diam: 2.70 cm Ao Asc diam:  4.00 cm MITRAL VALVE MV Area (PHT): 4.06 cm    SHUNTS MV Decel Time: 187 msec    Systemic VTI:  0.12 m MV E velocity: 81.80 cm/s  Systemic Diam: 2.00 cm Kardie Tobb DO Electronically signed by Berniece Salines DO Signature Date/Time: 09/16/2021/5:54:30 PM    Final    DG CHEST PORT 1 VIEW  Result Date: 09/16/2021 CLINICAL DATA:  PICC line placement. EXAM: PORTABLE CHEST 1 VIEW COMPARISON:  Chest x-ray 09/16/2021 FINDINGS: Endotracheal tube tip is 5.2 cm above the carina. Enteric tube extends below the diaphragm. There is a new right upper extremity PICC terminating in the SVC. Patient is status post cardiac surgery and valve replacement, unchanged. Cardiac silhouette is enlarged. There is some increasing  retrocardiac opacities. No pleural effusion or pneumothorax identified. Osseous structures are stable. IMPRESSION: 1. Right upper extremity PICC terminates in the SVC. 2. Increasing retrocardiac atelectasis/airspace disease. 3. Stable cardiomegaly.  Electronically Signed   By: Ronney Asters M.D.   On: 09/16/2021 15:30   [place] EKG SITE RITE  Result Date: 09/16/2021 If Site Rite image not attached, placement could not be confirmed due to current cardiac rhythm.  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 Patient's last known normal was at 1430 NIH stroke scale of 24, modified Rankin scale of 1. TNK was not given as the patient was on apixaban. EXAM: ULTRASOUND-GUIDED VASCULAR ACCESS DIAGNOSTIC CEREBRAL ANGIOGRAM MECHANICAL THROMBECTOMY FLAT PANEL HEAD CT COMPARISON:  None Available. MEDICATIONS: Ancef 2 g IV. The antibiotic was administered within 1 hour of the procedure Performing surgeon: Dr. Frazier Richards Assistant: Dr. Karenann Cai ANESTHESIA/SEDATION: General anesthesia CONTRAST:  Twenty-five mL of Omnipaque 300 milligram/mL FLUOROSCOPY: Radiation Exposure Index (as provided by the fluoroscopic device): 025 mGy Kerma COMPLICATIONS: SIR Level A - No therapy, no consequence. TECHNIQUE: Informed written consent was obtained from the patient's wife after a thorough discussion of the procedural risks, benefits and alternatives. All questions were addressed. Maximal Sterile Barrier Technique was utilized including caps, mask, sterile gowns, sterile gloves, sterile drape, hand hygiene and skin antiseptic. A timeout was performed prior to the initiation of the procedure. The right groin was prepped and draped in the usual sterile fashion. Using a micropuncture kit and the modified Seldinger technique, access was gained to the right common femoral artery and an 8 [place] by 25 cm sheath was placed. Real-time ultrasound guidance was utilized for vascular access including the acquisition of a permanent ultrasound image documenting  patency of the accessed vessel. Under fluoroscopy, a Zoom 88 guide catheter was navigated over a 5 [place] VTK catheter and a 0.035" Terumo Glidewire into the aortic arch. The catheter was placed into the left common carotid artery and then advanced into the left internal carotid artery under roadmap guidance. The diagnostic catheter was removed. Frontal and lateral angiograms of the head were obtained. FINDINGS: 1. Normal caliber of the right common femoral artery, adequate for vascular access. 2. Occlusion of a proximal left M1/MCA. 3. Delayed collaterals from the left ACA to the left MCA vascular tree. PROCEDURE: Using biplane roadmap, a zoom 071 aspiration catheter was navigated over an Aristotle 24 microguidewire into the cavernous segment of the left ICA. The aspiration catheter was then advanced to the level of occlusion at the left M1 MCA and connected to an aspiration pump. Continuous aspiration was performed for 2 minutes. The guide catheter was connected to a VacLok syringe. The aspiration catheter was subsequently removed under constant aspiration. The guide catheter was aspirated for debris. Then, biplane DSA cerebral angiogram was performed by injecting contrast through the guide catheter which demonstrated complete recanalization of the proximal left MCA and its branches (TICI 3). The MCA and its branches have a normal appearance. No aneurysm, av malformation, dural fistula or vasculitis seen. The venous phase of the cerebral angiogram has a normal appearance with a preferential venous return into the right transverse, sigmoid sinuses and the into the internal jugular vein. Mild narrowing of the left transverse sinus. Flat panel CT of the head was obtained and post processed in a separate  workstation with concurrent attending physician supervision. Selected images were sent to PACS. Flat panel CT of the head has a normal appearance without evidence of a bleed. Right common femoral artery angiogram was  obtained in right anterior oblique view. The puncture is at the level of the common femoral artery. The artery has normal caliber, adequate for closure device. The sheath was exchanged over the wire for a Perclose prostyle which was utilized for access closure. Immediate hemostasis was achieved. IMPRESSION: 1. Successful mechanical thrombectomy for treatment of an occlusion of the proximal left M1/MCA. Complete recanalization (TICI 3) of the left MCA was attained. 2. Flat panel CT head was unremarkable. PLAN: 1. Transfer to ICU. 2. Blood pressure goals of 120-140 mm Hg. Electronically Signed   By: Frazier Richards M.D.   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 Patient's last known normal was at 1430 NIH stroke scale of 24, modified Rankin scale of 1. TNK was not given as the patient was on apixaban. EXAM: ULTRASOUND-GUIDED VASCULAR ACCESS DIAGNOSTIC CEREBRAL ANGIOGRAM MECHANICAL THROMBECTOMY FLAT PANEL HEAD CT COMPARISON:  None Available. MEDICATIONS: Ancef 2 g IV. The antibiotic was administered within 1 hour of the procedure Performing surgeon: Dr. Frazier Richards Assistant: Dr. Karenann Cai ANESTHESIA/SEDATION: General anesthesia CONTRAST:  Twenty-five mL of Omnipaque 300 milligram/mL FLUOROSCOPY: Radiation Exposure Index (as provided by the fluoroscopic device): 270 mGy Kerma COMPLICATIONS: SIR Level A - No therapy, no consequence. TECHNIQUE: Informed written consent was obtained from the patient's wife after a thorough discussion of the procedural risks, benefits and alternatives. All questions were addressed. Maximal Sterile Barrier Technique was utilized including caps, mask, sterile gowns, sterile gloves, sterile drape, hand hygiene and skin antiseptic. A timeout was performed prior to the  initiation of the procedure. The right groin was prepped and draped in the usual sterile fashion. Using a micropuncture kit and the modified Seldinger technique, access was gained to the right common femoral artery and an 8 [place] by 25 cm sheath was placed. Real-time ultrasound guidance was utilized for vascular access including the acquisition of a permanent ultrasound image documenting patency of the accessed vessel. Under fluoroscopy, a Zoom 88 guide catheter was navigated over a 5 [place] VTK catheter and a 0.035" Terumo Glidewire into the aortic arch. The catheter was placed into the left common carotid artery and then advanced into the left internal carotid artery under roadmap guidance. The diagnostic catheter was removed. Frontal and lateral angiograms of the head were obtained. FINDINGS: 1. Normal caliber of the right common femoral artery, adequate for vascular access. 2. Occlusion of a proximal left M1/MCA. 3. Delayed collaterals from the left ACA to the left MCA vascular tree. PROCEDURE: Using biplane roadmap, a zoom 071 aspiration catheter was navigated over an Aristotle 24 microguidewire into the cavernous segment of the left ICA. The aspiration catheter was then advanced to the level of occlusion at the left M1 MCA and connected to an aspiration pump. Continuous aspiration was performed for 2 minutes. The guide catheter was connected to a VacLok syringe. The aspiration catheter was subsequently removed under constant aspiration. The guide catheter was aspirated for debris. Then, biplane DSA cerebral angiogram was performed by injecting contrast through the guide catheter which demonstrated complete recanalization of the proximal left MCA and its branches (TICI 3).  The MCA and its branches have a normal appearance. No aneurysm, av malformation, dural fistula or vasculitis seen. The venous phase of the cerebral angiogram has a normal appearance with a preferential venous return into the right  transverse, sigmoid sinuses and the into the internal jugular vein. Mild narrowing of the left transverse sinus. Flat panel CT of the head was obtained and post processed in a separate workstation with concurrent attending physician supervision. Selected images were sent to PACS. Flat panel CT of the head has a normal appearance without evidence of a bleed. Right common femoral artery angiogram was obtained in right anterior oblique view. The puncture is at the level of the common femoral artery. The artery has normal caliber, adequate for closure device. The sheath was exchanged over the wire for a Perclose prostyle which was utilized for access closure. Immediate hemostasis was achieved. IMPRESSION: 1. Successful mechanical thrombectomy for treatment of an occlusion of the proximal left M1/MCA. Complete recanalization (TICI 3) of the left MCA was attained. 2. Flat panel CT head was unremarkable. PLAN: 1. Transfer to ICU. 2. Blood pressure goals of 120-140 mm Hg. Electronically Signed   By: Frazier Richards M.D.   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 Patient's last known normal was at 1430 NIH stroke scale of 24, modified Rankin scale of 1. TNK was not given as the patient was on apixaban. EXAM: ULTRASOUND-GUIDED VASCULAR ACCESS DIAGNOSTIC CEREBRAL ANGIOGRAM MECHANICAL THROMBECTOMY FLAT PANEL HEAD CT COMPARISON:  None Available. MEDICATIONS: Ancef 2 g IV. The antibiotic was administered within 1 hour of the procedure Performing surgeon: Dr. Frazier Richards Assistant: Dr. Karenann Cai ANESTHESIA/SEDATION: General anesthesia CONTRAST:  Twenty-five mL of Omnipaque 300 milligram/mL FLUOROSCOPY: Radiation Exposure Index (as provided by the fluoroscopic device): 330 mGy Kerma COMPLICATIONS: SIR Level A - No therapy,  no consequence. TECHNIQUE: Informed written consent was obtained from the patient's wife after a thorough discussion of the procedural risks, benefits and alternatives. All questions were addressed. Maximal Sterile Barrier Technique was utilized including caps, mask, sterile gowns, sterile gloves, sterile drape, hand hygiene and skin antiseptic. A timeout was performed prior to the initiation of the procedure. The right groin was prepped and draped in the usual sterile fashion. Using a micropuncture kit and the modified Seldinger technique, access was gained to the right common femoral artery and an 8 [place] by 25 cm sheath was placed. Real-time ultrasound guidance was utilized for vascular access including the acquisition of a permanent ultrasound image documenting patency of the accessed vessel. Under fluoroscopy, a Zoom 88 guide catheter was navigated over a 5 [place] VTK catheter and a 0.035" Terumo Glidewire into the aortic arch. The catheter was placed into the left common carotid artery and then advanced into the left internal carotid artery under roadmap guidance. The diagnostic catheter was removed. Frontal and lateral angiograms of the head were obtained. FINDINGS: 1. Normal caliber of the right common femoral artery, adequate for vascular access. 2. Occlusion of a proximal left M1/MCA. 3. Delayed collaterals from the left ACA to the left MCA vascular tree. PROCEDURE: Using biplane roadmap, a zoom 071 aspiration catheter was navigated over an Aristotle 24 microguidewire into the cavernous segment of the left ICA. The aspiration catheter was then advanced to the level of occlusion at the left M1 MCA  and connected to an aspiration pump. Continuous aspiration was performed for 2 minutes. The guide catheter was connected to a VacLok syringe. The aspiration catheter was subsequently removed under constant aspiration. The guide catheter was aspirated for debris. Then, biplane DSA cerebral angiogram was performed  by injecting contrast through the guide catheter which demonstrated complete recanalization of the proximal left MCA and its branches (TICI 3). The MCA and its branches have a normal appearance. No aneurysm, av malformation, dural fistula or vasculitis seen. The venous phase of the cerebral angiogram has a normal appearance with a preferential venous return into the right transverse, sigmoid sinuses and the into the internal jugular vein. Mild narrowing of the left transverse sinus. Flat panel CT of the head was obtained and post processed in a separate workstation with concurrent attending physician supervision. Selected images were sent to PACS. Flat panel CT of the head has a normal appearance without evidence of a bleed. Right common femoral artery angiogram was obtained in right anterior oblique view. The puncture is at the level of the common femoral artery. The artery has normal caliber, adequate for closure device. The sheath was exchanged over the wire for a Perclose prostyle which was utilized for access closure. Immediate hemostasis was achieved. IMPRESSION: 1. Successful mechanical thrombectomy for treatment of an occlusion of the proximal left M1/MCA. Complete recanalization (TICI 3) of the left MCA was attained. 2. Flat panel CT head was unremarkable. PLAN: 1. Transfer to ICU. 2. Blood pressure goals of 120-140 mm Hg. Electronically Signed   By: Frazier Richards M.D.   On: 09/16/2021 10:22   DG Abd Portable 1V  Addendum Date: 09/16/2021   ADDENDUM REPORT: 09/16/2021 08:27 ADDENDUM: Study discussed by telephone with Nurse Apolonio Schneiders on 09/16/2021 at 0823 hours. Electronically Signed   By: Genevie Ann M.D.   On: 09/16/2021 08:27   Result Date: 09/16/2021 CLINICAL DATA:  81 year old male with projectile vomiting. EXAM: PORTABLE ABDOMEN - 1 VIEW COMPARISON:  09/15/2021. FINDINGS: Portable AP supine view at 0756 hours. Enteric tube appears pulled back slightly, side hole now at the expected level of the GEJ.  Stable epigastric and right upper quadrant surgical clips. Negative visible lung bases. Increasing gas-filled bowel loops throughout the abdomen and pelvis, superimposed on retained stool in the colon. Possible dilated small bowel now. But gas is present to the rectum. No definite pneumoperitoneum on these supine views. No acute osseous abnormality identified. IMPRESSION: 1. Enteric tube has been pulled back, side hole now at the level of the GEJ. Recommend advancing 6 cm to ensure side hole placement inside the stomach. 2. Increasing gas-filled bowel throughout the abdomen and pelvis, most suggestive of Acute Ileus. Electronically Signed: By: Genevie Ann M.D. On: 09/16/2021 08:18   DG CHEST PORT 1 VIEW  Result Date: 09/16/2021 CLINICAL DATA:  82 year old male with history of chest congestion. EXAM: PORTABLE CHEST 1 VIEW COMPARISON:  Chest x-ray 09/16/2021. FINDINGS: An endotracheal tube is in place with tip 7.4 cm above the carina. A nasogastric tube is seen extending into the stomach, however, the tip of the nasogastric tube extends below the lower margin of the image. Lung volumes are normal. No consolidative airspace disease. No pleural effusions. No pneumothorax. No pulmonary nodule or mass noted. Pulmonary vasculature and the cardiomediastinal silhouette are within normal limits. 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 IMPRESSION: 1. Support apparatus, as above. 2. No radiographic evidence of acute cardiopulmonary disease. 3. Aortic  atherosclerosis. Electronically Signed   By: Vinnie Langton M.D.   On: 09/16/2021 08:12   DG Chest Port 1 View  Result Date: 09/16/2021 CLINICAL DATA:  80 year old male with history of stroke. EXAM: PORTABLE CHEST 1 VIEW COMPARISON:  Chest x-ray 09/15/2021. FINDINGS: An endotracheal tube is in place with tip 7.0 cm above the carina. A nasogastric tube is seen extending into the stomach,  however, the tip of the nasogastric tube extends below the lower margin of the image. Lung volumes are normal. No consolidative airspace disease. No pleural effusions. No pneumothorax. No evidence of pulmonary edema. Heart size is mildly enlarged. Upper mediastinal contours are within normal limits. Atherosclerotic calcifications are noted in the thoracic aorta. Status post median sternotomy for aortic valve replacement (a stented bioprosthesis is noted). IMPRESSION: 1. Postoperative changes and support apparatus, as above. 2. No radiographic evidence of acute cardiopulmonary disease. 3. Mild cardiomegaly. 4. Aortic atherosclerosis. Electronically Signed   By: Vinnie Langton M.D.   On: 09/16/2021 05:50   MR ANGIO HEAD WO CONTRAST  Result Date: 09/16/2021 CLINICAL DATA:  81 year old male with paroxysmal atrial fibrillation on Eliquis. New onset aphasia, right side weakness. Code stroke presentation yesterday. Left M1 occlusion by CTA. EXAM: MRA HEAD WITHOUT CONTRAST TECHNIQUE: Angiographic images of the Circle of Willis were acquired using MRA technique without intravenous contrast. COMPARISON:  MRI brain today reported separately. CTA head and neck yesterday. FINDINGS: Anterior circulation: Antegrade flow in both ICA siphons with bilateral siphon irregularity in keeping with the atherosclerosis and stenosis better demonstrated by CTA. Carotid termini remain patent. And now the left MCA M1 segment, anterior temporal artery origin, and left MCA bifurcation are patent. Left MCA branches appear not significantly changed from yesterday, including a long segment M2 stenosis demonstrated in the posterior division on series 1035, image 4. Right MCA M1 segment is patent but with moderate to severe stenosis upstream of the right MCA bifurcation, concordant with CTA appearance yesterday. Likewise, right MCA branches appear stable with mild irregularity. Anterior communicating artery and visible ACA branches are stable,  mild right A2 stenosis. Posterior circulation: Antegrade flow in the dominant distal left vertebral artery with patent left PICA origin. No distal left vertebral stenosis. Asymmetrically decreased flow in the smaller distal right vertebral artery, which did appear atherosclerotic and stenotic on the CTA yesterday but was patent at that time to the vertebrobasilar junction. Right PICA origin appears to remain patent. Patent basilar artery without stenosis. Patent SCA and PCA origins. Bilateral PCA branches appear stable, within normal limits. Anatomic variants: Dominant left vertebral V4 segment. Other: Brain MRI reported separately today. IMPRESSION: 1. Recanalized Left MCA M1 segment. Patent left MCA bifurcation. Upstream ICA atherosclerosis and stenosis, and downstream MCA M2 branch stenosis appears unchanged from the CTA yesterday. 2. Elsewhere stable intracranial atherosclerosis and stenosis, most notably both ICA siphons, Right MCA bifurcation, distal Right Vertebral Artery. Electronically Signed   By: Genevie Ann M.D.   On: 09/16/2021 05:49   MR BRAIN WO CONTRAST  Result Date: 09/16/2021 CLINICAL DATA:  81 year old male with paroxysmal atrial fibrillation on Eliquis. New onset aphasia, right side weakness. Code stroke presentation yesterday. Left M1 occlusion. EXAM: MRI HEAD WITHOUT CONTRAST TECHNIQUE: Multiplanar, multiecho pulse sequences of the brain and surrounding structures were obtained without intravenous contrast. COMPARISON:  CT head and CTA head and neck yesterday. FINDINGS: Brain: Patchy restricted diffusion throughout the left basal ganglia including the caudate and the lentiform. There is some involvement of the posterior limb of the left  external capsule. And there is some mesial temporal lobe involvement on series 5, image 73. But otherwise Insula and other left MCA territory cortex seems spared. No contralateral right hemisphere or posterior fossa restricted diffusion. Multiple chronic  generally small cerebellar infarcts on the right. Patchy and confluent bilateral cerebral white matter T2 and FLAIR hyperintensity with a small area of chronic left middle frontal gyrus encephalomalacia (series 10, image 19). There are several chronic microhemorrhages also in the left frontal lobe (series 22, image 35). No midline shift, mass effect, evidence of mass lesion, ventriculomegaly, extra-axial collection or acute intracranial hemorrhage. Cervicomedullary junction and pituitary are within normal limits. Vascular: Major intracranial vascular flow voids are preserved except for the distal right vertebral artery, which was non dominant but patent by CTA yesterday. Skull and upper cervical spine: Negative. Visualized bone marrow signal is within normal limits. Sinuses/Orbits: Postoperative changes to both globes. Paranasal Visualized paranasal sinuses and mastoids are stable and well aerated. Other: Intubated. Small volume fluid in the pharynx and nasal cavity. Grossly normal visible internal auditory structures. IMPRESSION: 1. Patchy acute infarct in the left basal ganglia and the left mesial temporal lobe with no associated hemorrhage or mass effect. 2. Underlying chronic small and medium-sized vessel ischemia. 3. MRA Head reported separately today. Electronically Signed   By: Genevie Ann M.D.   On: 09/16/2021 05:23   DG Abd Portable 1V  Result Date: 09/15/2021 CLINICAL DATA:  OG tube placement. EXAM: PORTABLE ABDOMEN - 1 VIEW COMPARISON:  None Available. FINDINGS: Tip and side port of the enteric tube below the diaphragm in the stomach. Enteric chain sutures in the left upper quadrant as well as multiple surgical clips. Surgical clips in the right upper quadrant. No bowel dilatation or evidence of obstruction in the included upper abdomen. IMPRESSION: Tip and side port of the enteric tube below the diaphragm in the stomach. Electronically Signed   By: Keith Rake M.D.   On: 09/15/2021 21:23   DG  CHEST PORT 1 VIEW  Result Date: 09/15/2021 CLINICAL DATA:  Intubation. EXAM: PORTABLE CHEST 1 VIEW COMPARISON:  10/02/2020 FINDINGS: Endotracheal tube tip 5.4 cm from the carina at the level of the clavicular heads. Enteric tube in place with tip below the diaphragm not included on this chest field of view. Prior median sternotomy prosthetic aortic valve. Cardiomegaly. Hazy opacity at the left greater than right lung base likely represents combination of atelectasis and pleural effusions. Vascular congestion. No pneumothorax. IMPRESSION: 1. Endotracheal tube tip 5.4 cm from the carina at the level of the clavicular heads. Enteric tube in place with tip below the diaphragm. 2. Cardiomegaly. Vascular congestion. Hazy bibasilar lung opacities likely combination of pleural effusions and atelectasis. Suspect CHF. Electronically Signed   By: Keith Rake M.D.   On: 09/15/2021 21:21   CT ANGIO HEAD NECK W WO CM (CODE STROKE)  Result Date: 09/15/2021 CLINICAL DATA:  Provided history: Neuro deficit, acute, stroke suspected; right-sided weakness and facial droop. EXAM: CT ANGIOGRAPHY HEAD AND NECK TECHNIQUE: Multidetector CT imaging of the head and neck was performed using the standard protocol during bolus administration of intravenous contrast. Multiplanar CT image reconstructions and MIPs were obtained to evaluate the vascular anatomy. Carotid stenosis measurements (when applicable) are obtained utilizing NASCET criteria, using the distal internal carotid diameter as the denominator. RADIATION DOSE REDUCTION: This exam was performed according to the departmental dose-optimization program which includes automated exposure control, adjustment of the mA and/or kV according to patient size and/or use of iterative  reconstruction technique. CONTRAST:  40m OMNIPAQUE IOHEXOL 350 MG/ML SOLN COMPARISON:  Noncontrast head CT performed immediately prior 09/15/2021. FINDINGS: CTA NECK FINDINGS Aortic arch: Standard aortic  branching. Atherosclerotic plaque within the visualized aortic arch and proximal major branch vessels of the neck. No hemodynamically significant innominate or proximal subclavian artery stenosis. Right carotid system: CCA and ICA patent within the neck. Mild scattered atherosclerotic plaque within the proximal to mid CCA. Prominent predominantly calcified plaque about the carotid bifurcation and within the proximal ICA. Resultant in 50-60% stenosis at the origin of the right ICA. Left carotid system: CCA and ICA patent within the neck. Mild atherosclerotic plaque scattered within the proximal to mid CCA. Prominent atherosclerotic plaque about the carotid bifurcation and within the proximal ICA. Resultant 40% stenosis of the proximal ICA. Vertebral arteries: Vertebral arteries patent within the neck. The left vertebral artery is dominant. Severe atherosclerotic stenosis at the origin of the right vertebral artery. Mild atherosclerotic narrowing at the origin of the left vertebral artery. Skeleton: Cervical spondylosis. No acute fracture or aggressive osseous lesion. Other neck: No neck mass or cervical lymphadenopathy. Upper chest: Prior median sternotomy. Smooth interlobular septal thickening within the imaged lung apices, likely reflecting interstitial edema. Partially imaged right pleural effusion. Review of the MIP images confirms the above findings CTA HEAD FINDINGS Anterior circulation: The intracranial internal carotid arteries are patent. Atherosclerotic plaque within both vessels. Most notably, there is moderate atherosclerotic narrowing of the distal cavernous/paraclinoid right ICA, and severe stenosis of the distal cavernous/paraclinoid left ICA. The left M1 MCA segment is occluded at its origin. There is some reconstitution of enhancement within M2 and more distal left MCA vessels. The right M1 segment is patent. Atherosclerotic irregularity of the M2 and more distal right MCA vessels. No appreciable  right M2 proximal branch occlusion or high-grade proximal stenosis. The anterior cerebral arteries are patent. Posterior circulation: The intracranial vertebral arteries are patent. Nonstenotic atherosclerotic plaque within the non dominant V4 right vertebral artery. The basilar artery is patent. The posterior cerebral arteries are patent. Atherosclerotic irregularity of both vessels without high-grade proximal stenosis. Posterior communicating arteries are diminutive or absent, bilaterally. Venous sinuses: Within the limitations of contrast timing, no convincing thrombus. Anatomic variants: As described. Review of the MIP images confirms the above findings CTA head impression #1 called by telephone at the time of interpretation on 09/15/2021 at 3:50 pm to provider HIsla Pence who verbally acknowledged these results. IMPRESSION: CTA neck: 1. The common carotid, internal carotid and vertebral arteries are patent within the neck. 2. Atherosclerotic narrowing at the origin of the right ICA of 50-60%. 3. Atherosclerotic narrowing at the origin of the left ICA of 40%. 4. Severe stenosis at the origin of the non-dominant right vertebral artery. 5. Mild atherosclerotic narrowing at the origin of the dominant left vertebral artery. 6.  Aortic Atherosclerosis (ICD10-I70.0). 7. Interstitial edema within the imaged lung apices. 8. Partially imaged right pleural effusion. CTA head: 1. The M1 segment of the left middle cerebral artery is occluded at its origin. There is some reconstitution of enhancement within M2 and more distal left MCA vessels, likely due to collateral flow. 2. 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 3. Severe stenosis of the distal cavernous/paraclinoid left ICA. 4. Moderate stenosis of the distal cavernous/paraclinoid right ICA. Electronically Signed   By: KKellie SimmeringD.O.   On: 09/15/2021 16:20   CT HEAD CODE STROKE WO CONTRAST  Result  Date: 09/15/2021 CLINICAL DATA:  Code stroke.  Neuro deficit, acute, stroke suspected. Right-sided weakness and facial droop. EXAM: CT HEAD WITHOUT CONTRAST TECHNIQUE: Contiguous axial images were obtained from the base of the skull through the vertex without intravenous contrast. RADIATION DOSE REDUCTION: This exam was performed according to the departmental dose-optimization program which includes automated exposure control, adjustment of the mA and/or kV according to patient size and/or use of iterative reconstruction technique. COMPARISON:  No pertinent prior exams available for comparison. FINDINGS: Brain: Mild generalized cerebral atrophy. Suspected subtle loss of gray-white differentiation within the left insula (for instance as seen on series 2, image 18). Small left MCA territory cortical/subcortical infarct within the left parietal lobe (involving the left postcentral gyrus). This is favored chronic. Background mild patchy and ill-defined hypoattenuation within the cerebral white matter, nonspecific but compatible chronic small vessel ischemic disease. This includes a chronic lacunar infarct within the right centrum semiovale/corona radiata. 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 There is no acute intracranial hemorrhage. No extra-axial fluid collection. No evidence of an intracranial mass. No midline shift. Vascular: Hyperdense M1 left MCA segment, highly suspicious for presence of endoluminal thrombus. Skull: No fracture or aggressive osseous lesion. Sinuses/Orbits: No mass or acute finding within the imaged orbits. Small mucous retention cyst versus polyp within the left maxillary sinus. Minimal mucosal thickening within the right sphenoid sinus and scattered within the bilateral ethmoid sinuses. ASPECTS ([place] Stroke Program Early CT Score) - Ganglionic level infarction (caudate, lentiform nuclei, internal capsule, insula, M1-M3 cortex): 6 - Supraganglionic infarction (M4-M6  cortex): 3 Total score (0-10 with 10 being normal): 9 Critical/emergent results were called by telephone at the time of interpretation on 09/15/2021 at 3:45 pm to provider JULIE HAVILAND , who verbally acknowledged these results. IMPRESSION: 1. Hyperdense left M1 MCA segment highly suspicious for the presence of endoluminal thrombus. 2. Suspected subtle acute infarct involving the left insula. ASPECTS is 9. 3. Small left MCA territory cortically-based infarct within the left parietal lobe (with involvement of the postcentral gyrus), favored chronic. 4. Background mild chronic small ischemic changes within the cerebral white matter. This includes a chronic lacunar infarct within the right centrum semiovale/corona radiata. 5. 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 6. Mild generalized parenchymal atrophy. Electronically Signed   By: Kellie Simmering D.O.   On: 09/15/2021 15:50     PHYSICAL EXAM  Temp:  [97.8 F (36.6 C)-98.6 F (37 C)] 97.9 F (36.6 C) (06/17 1124) Pulse Rate:  [59-90] 59 (06/17 1044) Resp:  [16-25] 25 (06/17 1044) BP: (122-143)/(64-74) 141/64 (06/17 1044) SpO2:  [93 %-100 %] 96 % (06/17 1044) Arterial Line BP: (96-216)/(46-211) 135/63 (06/17 0615) FiO2 (%):  [40 %] 40 % (06/17 1044)  General - Well nourished, well developed, elderly Caucasian male.  Mild respiratory distress.  Was on BiPAP. Ophthalmologic - fundi not visualized due to noncooperation.  Cardiovascular - irregularly irregular heart rate and rhythm.  Neuro -patient is drowsy but eyes open on voice, able to follow most simple midline and peripheral commands. With eye opening, eyes mid position, but not blinking to visual threat consistently, not tracking, PERRL. Corneal reflex present, gag and cough present. Breathing over the vent.   Face symmetric.  Tongue protrusion not cooperative. On moving all extremities against gravity and symmetrical. DTR 1+ and no babinski. Sensation, coordination  not quite cooperative and gait not tested.    ASSESSMENT/PLAN [name] is a 81 y.o. male with history of CHF, A-fib on Eliquis CKD, hypertension, diabetes, CHF, PE in 2022,  chronic DVT, aortic stenosis status post AVR admitted for aphasia, right-sided weakness, right facial droop. No tPA given due to on Eliquis.    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 IR with left M1 occlusion s/p TICI3 reperfusion MRI left BG, CR and mesial temporal lobe small infarcts MRA left M1 occlusion recannulized, patent left MCA.  Stable intracranial atherosclerosis and stenosis most notably both ICA siphon, right MCA bifurcation and distal right VA 2D Echo EF 20 to 25% LDL 66 HgbA1c 5.9 Heparin subcu for VTE prophylaxis Eliquis (apixaban) daily prior to admission, given small infarcts, OK to start [facility] with heparin IV. Transition to po once po access.  Ongoing aggressive stroke risk factor management Therapy recommendations: Pending Disposition: Pending  Respiratory failure Aspiration Intubated for procedure and airway protection Had a massive aspiration 6/15 On Rocephin Ventilation management per CCM improving  Chronic A-fib On Eliquis at home Rate controlled Also on amiodarone Follow-up with Dr. Jac Canavan at [place] now on heparin IV, transition to po once po access  Cardiomyopathy EF 20 to 25% Follows with Dr. Jac Canavan at [place] Not on significant heart failure regimen given hypotension and bradycardia. now on heparin IV, transition to po once po access  Ileus Likely the cause of large vomitus with  aspiration Bowel rest OG wall suctioning  Diabetes HgbA1c 5.9 goal < 7.0 Controlled CBG monitoring SSI DM education and close PCP follow up  History of hypertension Hypotension BP on the low end On Levophed CCM on board Long term BP goal normotensive  Lipid management Home meds: None LDL 66, goal < 70 No statin for now given advanced age and LDL at goal as well as no po access  AKI on CKD CKD 3, creatinine 1.60-1.39->2.43 CCM on board Avoid nephrotoxic meds  Other Stroke Risk Factors Advanced age PE in 2022 Chronic DVT  Other Active Problems AS s/p bioprosthetic aortic valve    Hospital day # 3 Patient was just extubated.  Wean off BiPAP as tolerated.  Speech therapy to swallow eval.  Continue IV heparin till patient is able to tolerate p.o.'s then switch back to anticoagulation with Eliquis or Pradaxa.  Long discussion with the patient and wife at the bedside and answered questions.  Discussed with Dr. Lake Bells surgical care medicine. This patient is critically ill due to left MCA stroke, left M1 occlusion status post IR, respiratory failure, massive aspiration pneumonia, severe CHF and chronic A-fib and at significant risk of neurological worsening, death form recurrent stroke, hemorrhagic transmission, heart failure, sepsis, seizure. This patient's care requires constant monitoring of vital signs, hemodynamics, respiratory and cardiac monitoring, review of multiple databases, neurological assessment, discussion with family, other specialists and medical decision making of high complexity. I spent 35 minutes of neurocritical care time in the care of this patient. I had long discussion with daughter and the wife at bedside, updated pt current condition, treatment plan and potential prognosis, and answered all the questions.  They expressed understanding and appreciation.    Antony Contras, MD Stroke Neurology 09/18/2021 12:46 PM    To contact Stroke Continuity provider,  please refer to [email or web address] After hours, contact  General Neurology

## 2021-09-18 NOTE — Procedures (Signed)
Extubation Procedure Note  Patient Details:   Name: Eugene Campbell DOB: February 27, 1941 MRN: [id number]   Airway Documentation:    Vent end date: 09/18/21 Vent end time: 1042   Evaluation  O2 sats: stable throughout Complications: No apparent complications Patient did tolerate procedure well. Bilateral Breath Sounds: Clear, Diminished   Yes,  Per MD order, pt was extubated to BiPAP. Prior to extubation, pt did have a positive cuff leak and able to follow commands. Pt was able to state his name and cough up small amount of secretions. No stridor noted. Pt was placed on BiPAP and tolerating well with SVS. RN currently at bedside. RT will continue to monitor pt.  Megan Mans 09/18/2021, 10:42 AM

## 2021-09-18 NOTE — Progress Notes (Signed)
Physical Therapy Treatment Patient Details Name: Eugene Campbell MRN: [id number] DOB: 03-29-1941 Today's Date: 09/18/2021   History of Present Illness Pt is an 81 y/o male who presented with sudden onset of R sided weakness, facial droop and aphasia. 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 Pt remained intubated after procedure with complications due to acute hypoxic respiratory failure and massive aspiration. Pt extubated 6/17. PMH: a fib, HTN, PT and DVT, clotting disorder, DM2    PT Comments    Pt now extubated. Appears a little less perky today. Able to get up and to the chair. Used Stedy today due to only 1 person assist present and pt appeared more fatigued. Continue to recommend acute inpatient rehab (AIR) for post-acute therapy needs.   Recommendations for follow up therapy are one component of a multi-disciplinary discharge planning process, led by the attending physician.  Recommendations may be updated based on patient status, additional functional criteria and insurance authorization.  Follow Up Recommendations  Acute inpatient rehab (3hours/day)     Assistance Recommended at Discharge Frequent or constant Supervision/Assistance  Patient can return home with the following A little help with walking and/or transfers;A little help with bathing/dressing/bathroom;Assistance with cooking/housework;Assist for transportation;Help with stairs or ramp for entrance   Equipment Recommendations  Other (comment) (TBA)    Recommendations for Other Services       Precautions / Restrictions Precautions Precautions: Fall;Other (comment) Precaution Comments: monitor vitals, NGT, A line Restrictions Weight Bearing Restrictions: No     Mobility  Bed Mobility Overal bed mobility: Needs Assistance Bed Mobility: Supine to Sit     Supine to sit: Mod assist, HOB elevated     General  bed mobility comments: Assist to bring legs off of bed, elevate trunk into sitting and bring hips to EOB. Tactile/verbal cues to initiate    Transfers Overall transfer level: Needs assistance Equipment used: Ambulation equipment used Transfers: Sit to/from Stand, Bed to chair/wheelchair/BSC Sit to Stand: Min assist           General transfer comment: Assist to bring hips up and for balance. Used Stedy for bed to chair due to 1 person assist present and multiple lines/tubes Transfer via Lift Equipment: Stedy  Ambulation/Gait             Pre-gait activities: Stood x 2 with Stedy x 30 seconds with verbal/tactile cues to extend hips/trunk     Social research officer, government Rankin (Stroke Patients Only) Modified Rankin (Stroke Patients Only) Pre-Morbid Rankin Score: No symptoms Modified Rankin: Moderately severe disability     Balance Overall balance assessment: Needs assistance Sitting-balance support: Bilateral upper extremity supported, Feet supported Sitting balance-Leahy Scale: Poor Sitting balance - Comments: Min assist to maintain static sitting EOB Postural control: Right lateral lean, Posterior lean, Left lateral lean Standing balance support: During functional activity, Bilateral upper extremity supported Standing balance-Leahy Scale: Poor Standing balance comment: Min assist for static standing with STedy                            Cognition Arousal/Alertness: Awake/alert Behavior During Therapy: WFL for tasks assessed/performed, Impulsive Overall Cognitive Status: Difficult to assess  General Comments: Follows 1 step commands. Slow to process.        Exercises      General Comments General comments (skin integrity, edema, etc.): Wife and stepdaughter present      Pertinent Vitals/Pain Pain Assessment Pain Assessment: No/denies pain    Home Living                           Prior Function            PT Goals (current goals can now be found in the care plan section) Progress towards PT goals: Not progressing toward goals - comment    Frequency    Min 3X/week      PT Plan Current plan remains appropriate    Co-evaluation              AM-PAC PT "6 Clicks" Mobility   Outcome Measure  Help needed turning from your back to your side while in a flat bed without using bedrails?: A Lot Help needed moving from lying on your back to sitting on the side of a flat bed without using bedrails?: A Lot Help needed moving to and from a bed to a chair (including a wheelchair)?: A Lot Help needed standing up from a chair using your arms (e.g., wheelchair or bedside chair)?: A Little Help needed to walk in hospital room?: Total Help needed climbing 3-5 steps with a railing? : Total 6 Click Score: 11    End of Session Equipment Utilized During Treatment: Oxygen Activity Tolerance: Patient limited by fatigue Patient left: in chair;with call bell/phone within reach;with family/visitor present Nurse Communication: Mobility status;Need for lift equipment PT Visit Diagnosis: Other abnormalities of gait and mobility (R26.89);Difficulty in walking, not elsewhere classified (R26.2);Other symptoms and signs involving the nervous system (R29.898)     Time: 1749-4496 PT Time Calculation (min) (ACUTE ONLY): 36 min  Charges:  $Therapeutic Activity: 23-37 mins                     [facility] PT Acute Rehabilitation Services Office [phone]    Angelina Ok [facility] 09/18/2021, 4:13 PM

## 2021-09-18 NOTE — Progress Notes (Signed)
ANTICOAGULATION CONSULT NOTE - Follow-up Consult  Pharmacy Consult for IV heparin Indication: atrial fibrillation  Allergies  Allergen Reactions   Penicillins Hives, Swelling, Rash and Other (See Comments)    Peeling skin and caused the gums to peel/crack    Patient Measurements: Height: 6\' 1"  (185.4 cm) Weight: 75.2 kg (165 lb 12.6 oz) IBW/kg (Calculated) : 79.9 Heparin Dosing Weight: 75 kg  Vital Signs: Temp: 98.6 F (37 C) (06/17 0716) Temp Source: Oral (06/17 0716) BP: 143/67 (06/17 0748) Pulse Rate: 77 (06/17 0748)  Labs: Recent Labs    09/15/21 1525 09/15/21 1532 09/15/21 1946 09/16/21 0409 09/16/21 1130 09/17/21 0344 09/17/21 0418 09/17/21 2000 09/18/21 0345  HGB 14.5   < >  --  14.6   < > 15.6 15.8  --  13.9  HCT 43.2   < >  --  42.9   < > 46.0 46.3  --  40.5  PLT 109*  --   --  112*  --   --  104*  --  73*  APTT 31  --   --   --   --   --   --   --   --   LABPROT 14.2  --   --   --   --   --   --   --   --   INR 1.1  --   --   --   --   --   --   --   --   HEPARINUNFRC  --   --   --   --   --   --   --  0.42 0.41  CREATININE 1.54*   < >  --  1.39*  --   --  2.43*  --  2.12*  TROPONINIHS  --   --  33*  --   --   --   --   --   --    < > = values in this interval not displayed.    Estimated Creatinine Clearance: 29.6 mL/min (A) (by C-G formula based on SCr of 2.12 mg/dL (H)).   Medical History: Past Medical History:  Diagnosis Date   Aortic stenosis    Chronic renal impairment, stage 3 (moderate), unspecified whether stage 3a or 3b CKD ([name])    Diabetes ([name])    HFrEF (heart failure with reduced ejection fraction) ([name])    Hypothyroidism    PE (pulmonary thromboembolism) ([name]) 2022    Medications:  Infusions:   sodium chloride Stopped (09/16/21 1548)   cefTRIAXone (ROCEPHIN)  IV Stopped (09/17/21 1516)   heparin 1,000 Units/hr (09/18/21 0600)    Assessment: 81 y.o. male who per Care Everywhere has PMH including but not limited to A.fib on  Eliquis and Amiodarone, HTN, PE and DVT, clotting disorder (unknown what exactly), DM2 without long term insulin use, Hypothyroidism. Presenting with CVA s/p MT. Pharmacy consulted for IV heparin  Heparin level therapeutic - continue same rate.' Hgb down 15.8 >> 13.9. Platelets down from 109 to 73.   No bleeding noted.  SCr peak at 2.43, now trending down.   Goal of Therapy:  Heparin level 0.3-0.7 units/ml Monitor platelets by anticoagulation protocol: Yes   Plan:  Heparin drip at 1000 units/hr Daily heparin level and CBC ordered Monitor for signs/symptoms of bleed Follow-up ability to change to oral as able  96, PharmD, BCPS, BCCCP Clinical Pharmacist Please refer to [facility] for [facility] numbers 09/18/2021 9:28 AM

## 2021-09-18 NOTE — Plan of Care (Signed)
  Problem: Education: Goal: Knowledge of General Education information will improve Description: Including pain rating scale, medication(s)/side effects and non-pharmacologic comfort measures Outcome: Progressing   Problem: Health Behavior/Discharge Planning: Goal: Ability to manage health-related needs will improve Outcome: Progressing   Problem: Clinical Measurements: Goal: Ability to maintain clinical measurements within normal limits will improve Outcome: Progressing Goal: Will remain free from infection Outcome: Progressing Goal: Diagnostic test results will improve Outcome: Progressing Goal: Respiratory complications will improve Outcome: Progressing Goal: Cardiovascular complication will be avoided Outcome: Progressing   Problem: Activity: Goal: Risk for activity intolerance will decrease Outcome: Progressing   Problem: Nutrition: Goal: Adequate nutrition will be maintained Outcome: Progressing   Problem: Coping: Goal: Level of anxiety will decrease Outcome: Progressing   Problem: Elimination: Goal: Will not experience complications related to bowel motility Outcome: Progressing Goal: Will not experience complications related to urinary retention Outcome: Progressing   Problem: Pain Managment: Goal: General experience of comfort will improve Outcome: Progressing   Problem: Safety: Goal: Ability to remain free from injury will improve Outcome: Progressing   Problem: Skin Integrity: Goal: Risk for impaired skin integrity will decrease Outcome: Progressing   Problem: Education: Goal: Understanding of CV disease, CV risk reduction, and recovery process will improve Outcome: Progressing Goal: Individualized Educational Video(s) Outcome: Progressing   Problem: Activity: Goal: Ability to return to baseline activity level will improve Outcome: Progressing   Problem: Cardiovascular: Goal: Ability to achieve and maintain adequate cardiovascular perfusion  will improve Outcome: Progressing Goal: Vascular access site(s) Level 0-1 will be maintained Outcome: Progressing   Problem: Health Behavior/Discharge Planning: Goal: Ability to safely manage health-related needs after discharge will improve Outcome: Progressing   Problem: Education: Goal: Understanding of CV disease, CV risk reduction, and recovery process will improve Outcome: Progressing Goal: Individualized Educational Video(s) Outcome: Progressing   Problem: Activity: Goal: Ability to return to baseline activity level will improve Outcome: Progressing   Problem: Cardiovascular: Goal: Ability to achieve and maintain adequate cardiovascular perfusion will improve Outcome: Progressing Goal: Vascular access site(s) Level 0-1 will be maintained Outcome: Progressing   Problem: Health Behavior/Discharge Planning: Goal: Ability to safely manage health-related needs after discharge will improve Outcome: Progressing   Problem: Education: Goal: Knowledge of disease or condition will improve Outcome: Progressing Goal: Knowledge of secondary prevention will improve (SELECT ALL) Outcome: Progressing Goal: Knowledge of patient specific risk factors will improve (INDIVIDUALIZE FOR PATIENT) Outcome: Progressing Goal: Individualized Educational Video(s) Outcome: Progressing

## 2021-09-18 NOTE — Plan of Care (Signed)
  Problem: Education: Goal: Knowledge of General Education information will improve Description: Including pain rating scale, medication(s)/side effects and non-pharmacologic comfort measures Outcome: Progressing   Problem: Health Behavior/Discharge Planning: Goal: Ability to manage health-related needs will improve Outcome: Progressing   Problem: Clinical Measurements: Goal: Ability to maintain clinical measurements within normal limits will improve Outcome: Progressing Goal: Will remain free from infection Outcome: Progressing Goal: Diagnostic test results will improve Outcome: Progressing Goal: Respiratory complications will improve Outcome: Progressing Goal: Cardiovascular complication will be avoided Outcome: Progressing   Problem: Activity: Goal: Risk for activity intolerance will decrease Outcome: Progressing   Problem: Nutrition: Goal: Adequate nutrition will be maintained Outcome: Progressing   Problem: Coping: Goal: Level of anxiety will decrease Outcome: Progressing   Problem: Elimination: Goal: Will not experience complications related to bowel motility Outcome: Progressing Goal: Will not experience complications related to urinary retention Outcome: Progressing   Problem: Pain Managment: Goal: General experience of comfort will improve Outcome: Progressing   Problem: Safety: Goal: Ability to remain free from injury will improve Outcome: Progressing   Problem: Skin Integrity: Goal: Risk for impaired skin integrity will decrease Outcome: Progressing   Problem: Education: Goal: Understanding of CV disease, CV risk reduction, and recovery process will improve Outcome: Progressing Goal: Individualized Educational Video(s) Outcome: Progressing   Problem: Activity: Goal: Ability to return to baseline activity level will improve Outcome: Progressing   Problem: Cardiovascular: Goal: Ability to achieve and maintain adequate cardiovascular perfusion  will improve Outcome: Progressing Goal: Vascular access site(s) Level 0-1 will be maintained Outcome: Progressing   Problem: Health Behavior/Discharge Planning: Goal: Ability to safely manage health-related needs after discharge will improve Outcome: Progressing   Problem: Education: Goal: Understanding of CV disease, CV risk reduction, and recovery process will improve Outcome: Progressing Goal: Individualized Educational Video(s) Outcome: Progressing   Problem: Activity: Goal: Ability to return to baseline activity level will improve Outcome: Progressing   Problem: Cardiovascular: Goal: Ability to achieve and maintain adequate cardiovascular perfusion will improve Outcome: Progressing Goal: Vascular access site(s) Level 0-1 will be maintained Outcome: Progressing   Problem: Health Behavior/Discharge Planning: Goal: Ability to safely manage health-related needs after discharge will improve Outcome: Progressing   Problem: Education: Goal: Knowledge of disease or condition will improve Outcome: Progressing Goal: Knowledge of secondary prevention will improve (SELECT ALL) Outcome: Progressing Goal: Knowledge of patient specific risk factors will improve (INDIVIDUALIZE FOR PATIENT) Outcome: Progressing Goal: Individualized Educational Video(s) Outcome: Progressing   

## 2021-09-19 ENCOUNTER — Inpatient Hospital Stay (HOSPITAL_COMMUNITY): Payer: Medicare HMO

## 2021-09-19 DIAGNOSIS — I4811 Longstanding persistent atrial fibrillation: Secondary | ICD-10-CM | POA: Diagnosis not present

## 2021-09-19 DIAGNOSIS — J690 Pneumonitis due to inhalation of food and vomit: Secondary | ICD-10-CM | POA: Diagnosis not present

## 2021-09-19 DIAGNOSIS — J9601 Acute respiratory failure with hypoxia: Secondary | ICD-10-CM | POA: Diagnosis not present

## 2021-09-19 DIAGNOSIS — N179 Acute kidney failure, unspecified: Secondary | ICD-10-CM

## 2021-09-19 DIAGNOSIS — I639 Cerebral infarction, unspecified: Secondary | ICD-10-CM | POA: Diagnosis not present

## 2021-09-19 LAB — TRIGLYCERIDES: Triglycerides: 66 mg/dL (ref <150)

## 2021-09-19 LAB — BASIC METABOLIC PANEL
Anion gap: 10 (ref 5–15)
BUN: 39 mg/dL — ABNORMAL HIGH (ref 8–23)
CO2: 25 mmol/L (ref 22–32)
Calcium: 7.9 mg/dL — ABNORMAL LOW (ref 8.9–10.3)
Chloride: 98 mmol/L (ref 98–111)
Creatinine, Ser: 1.54 mg/dL — ABNORMAL HIGH (ref 0.61–1.24)
GFR, Estimated: 45 mL/min — ABNORMAL LOW (ref >60)
Glucose, Bld: 93 mg/dL (ref 70–99)
Potassium: 4 mmol/L (ref 3.5–5.1)
Sodium: 133 mmol/L — ABNORMAL LOW (ref 135–145)

## 2021-09-19 LAB — CBC
HCT: 37.3 % — ABNORMAL LOW (ref 39.0–52.0)
Hemoglobin: 12.5 g/dL — ABNORMAL LOW (ref 13.0–17.0)
MCH: 32.6 pg (ref 26.0–34.0)
MCHC: 33.5 g/dL (ref 30.0–36.0)
MCV: 97.4 fL (ref 80.0–100.0)
Platelets: 65 10*3/uL — ABNORMAL LOW (ref 150–400)
RBC: 3.83 MIL/uL — ABNORMAL LOW (ref 4.22–5.81)
RDW: 13.8 % (ref 11.5–15.5)
WBC: 10.6 10*3/uL — ABNORMAL HIGH (ref 4.0–10.5)
nRBC: 0 % (ref 0.0–0.2)

## 2021-09-19 LAB — HEPARIN LEVEL (UNFRACTIONATED)
Heparin Unfractionated: 0.15 IU/mL — ABNORMAL LOW (ref 0.30–0.70)
Heparin Unfractionated: <0.1 IU/mL — ABNORMAL LOW (ref 0.30–0.70)
Heparin Unfractionated: 0.1 IU/mL — ABNORMAL LOW (ref 0.30–0.70)

## 2021-09-19 LAB — GLUCOSE, CAPILLARY
Glucose-Capillary: 104 mg/dL — ABNORMAL HIGH (ref 70–99)
Glucose-Capillary: 107 mg/dL — ABNORMAL HIGH (ref 70–99)
Glucose-Capillary: 119 mg/dL — ABNORMAL HIGH (ref 70–99)
Glucose-Capillary: 89 mg/dL (ref 70–99)
Glucose-Capillary: 92 mg/dL (ref 70–99)

## 2021-09-19 MED ORDER — LEVOTHYROXINE SODIUM 75 MCG PO TABS
75.0000 ug | ORAL_TABLET | Freq: Every day | ORAL | Status: DC
Start: 2021-09-20 — End: 2021-09-22
  Administered 2021-09-20 – 2021-09-22 (×3): 75 ug via ORAL
  Filled 2021-09-19 (×3): qty 1

## 2021-09-19 MED ORDER — SENNA 8.6 MG PO TABS
2.0000 | ORAL_TABLET | Freq: Two times a day (BID) | ORAL | Status: DC
Start: 2021-09-19 — End: 2021-09-22
  Administered 2021-09-19 – 2021-09-22 (×5): 17.2 mg via ORAL
  Filled 2021-09-19 (×5): qty 2

## 2021-09-19 MED ORDER — HYDRALAZINE HCL 20 MG/ML IJ SOLN
10.0000 mg | INTRAMUSCULAR | Status: DC | PRN
  Administered 2021-09-19: 20 mg via INTRAVENOUS
  Administered 2021-09-19: 10 mg via INTRAVENOUS
  Filled 2021-09-19 (×2): qty 1

## 2021-09-19 MED ORDER — ATORVASTATIN CALCIUM 40 MG PO TABS
40.0000 mg | ORAL_TABLET | Freq: Every day | ORAL | Status: DC
  Administered 2021-09-20 – 2021-09-22 (×3): 40 mg via ORAL
  Filled 2021-09-19 (×3): qty 1

## 2021-09-19 MED ORDER — METOPROLOL TARTRATE 12.5 MG HALF TABLET
12.5000 mg | ORAL_TABLET | Freq: Two times a day (BID) | ORAL | Status: DC
  Administered 2021-09-19 – 2021-09-20 (×2): 12.5 mg via ORAL
  Filled 2021-09-19 (×2): qty 1

## 2021-09-19 NOTE — Progress Notes (Signed)
ANTICOAGULATION CONSULT NOTE  Pharmacy Consult for heparin Indication: atrial fibrillation Brief A/P: Heparin level subtherapeutic Increase Heparin rate  Allergies  Allergen Reactions   Penicillins Hives, Swelling, Rash and Other (See Comments)    Peeling skin and caused the gums to peel/crack    Patient Measurements: Height: 6\' 1"  (185.4 cm) Weight: 75.2 kg (165 lb 12.6 oz) IBW/kg (Calculated) : 79.9 Heparin Dosing Weight: 75 kg  Vital Signs: Temp: 98 F (36.7 C) (06/18 0401) Temp Source: Oral (06/18 0401) BP: 167/62 (06/18 0312) Pulse Rate: 88 (06/18 0415)  Labs: Recent Labs    09/17/21 0418 09/17/21 2000 09/18/21 0345 09/19/21 0347  HGB 15.8  --  13.9 12.5*  HCT 46.3  --  40.5 37.3*  PLT 104*  --  73* 65*  HEPARINUNFRC  --  0.42 0.41 0.15*  CREATININE 2.43*  --  2.12* 1.54*     Estimated Creatinine Clearance: 40.7 mL/min (A) (by C-G formula based on SCr of 1.54 mg/dL (H)).  Assessment: 81 y.o. male admitted with CVA, h/o Afib and Eliquis on hold, for heparin Goal of Therapy:  Heparin level 0.3-0.7 units/ml Monitor platelets by anticoagulation protocol: Yes Plan:  Increase Heparin 1200 units/hr Check heparin level in 8 hours.    96, PharmD, BCPS  09/19/2021 5:37 AM

## 2021-09-19 NOTE — Progress Notes (Signed)
RestartSTROKE TEAM PROGRESS NOTE   SUBJECTIVE (INTERVAL HISTORY) His RN, wife are at the bedside.  Pt was extubated yesterday and tolerating well. Just had bath so was lethargic. However, open eyes on voice and follows all simple commands, able to talk and interactive, able to name and repeat. Moving all extremities. Still pending swallow, now NPO. Ileus improved per CCM.   OBJECTIVE Temp:  [97.3 F (36.3 C)-98.3 F (36.8 C)] 97.3 F (36.3 C) (06/18 0814) Pulse Rate:  [54-88] 81 (06/18 0814) Cardiac Rhythm: Atrial fibrillation (06/18 0800) Resp:  [13-29] 19 (06/18 0814) BP: (138-183)/(57-64) 183/61 (06/18 0814) SpO2:  [92 %-98 %] 96 % (06/18 0814) Arterial Line BP: (129-173)/(51-70) 164/61 (06/18 0700) FiO2 (%):  [40 %] 40 % (06/17 1044)  Recent Labs  Lab 09/18/21 1121 09/18/21 1546 09/18/21 2011 09/19/21 0006 09/19/21 0401  GLUCAP 111* 93 71 89 92   Recent Labs  Lab 09/15/21 1525 09/15/21 1532 09/15/21 1732 09/15/21 1946 09/16/21 0409 09/16/21 1130 09/17/21 0344 09/17/21 0418 09/18/21 0345 09/19/21 0347  NA 137 136   < >  --  136 137 132* 134* 132* 133*  K 4.8 4.6   < >  --  4.6 3.9 5.1 5.1 5.3* 4.0  CL 101 99  --   --  103  --   --  103 98 98  CO2 27  --   --   --  23  --   --  22 22 25   GLUCOSE 113* 104*  --   --  161*  --   --  144* 110* 93  BUN 15 18  --   --  14  --   --  31* 40* 39*  CREATININE 1.54* 1.60*  --   --  1.39*  --   --  2.43* 2.12* 1.54*  CALCIUM 8.6*  --   --   --  8.9  --   --  7.7* 7.8* 7.9*  MG  --   --   --  1.9  --   --   --  2.4 2.4  --   PHOS  --   --   --  2.2*  --   --   --  7.2* 5.8*  --    < > = values in this interval not displayed.   Recent Labs  Lab 09/15/21 1525 09/17/21 0418 09/18/21 0345  AST 34 31 28  ALT 28 41 31  ALKPHOS 94 82 78  BILITOT 0.8 0.9 0.7  PROT 5.9* 5.3* 5.1*  ALBUMIN 3.2* 2.6* 2.2*   Recent Labs  Lab 09/15/21 1525 09/15/21 1532 09/16/21 0409 09/16/21 1130 09/17/21 0344 09/17/21 0418  09/18/21 0345 09/19/21 0347  WBC 7.8  --  11.9*  --   --  7.0 10.9* 10.6*  NEUTROABS 4.2  --   --   --   --  4.8  --   --   HGB 14.5   < > 14.6 16.0 15.6 15.8 13.9 12.5*  HCT 43.2   < > 42.9 47.0 46.0 46.3 40.5 37.3*  MCV 98.6  --  95.5  --   --  97.3 96.7 97.4  PLT 109*  --  112*  --   --  104* 73* 65*   < > = values in this interval not displayed.   No results for input(s): "CKTOTAL", "CKMB", "CKMBINDEX", "TROPONINI" in the last 168 hours. No results for input(s): "LABPROT", "INR" in the last 72 hours.  No results for  input(s): "COLORURINE", "LABSPEC", "PHURINE", "GLUCOSEU", "HGBUR", "BILIRUBINUR", "KETONESUR", "PROTEINUR", "UROBILINOGEN", "NITRITE", "LEUKOCYTESUR" in the last 72 hours.  Invalid input(s): "APPERANCEUR"      Component Value Date/Time   CHOL 133 09/16/2021 0409   TRIG 66 09/19/2021 0347   HDL 59 09/16/2021 0409   CHOLHDL 2.3 09/16/2021 0409   VLDL 8 09/16/2021 0409   LDLCALC 66 09/16/2021 0409   Lab Results  Component Value Date   HGBA1C 5.9 (H) 09/15/2021   No results found for: "LABOPIA", "COCAINSCRNUR", "LABBENZ", "AMPHETMU", "THCU", "LABBARB"  Recent Labs  Lab 09/15/21 1946  ETH <10    I have personally reviewed the radiological images below and agree with the radiology interpretations.  DG Abd Portable 1V  Result Date: 09/18/2021 CLINICAL DATA:  Abdominal distention. EXAM: PORTABLE ABDOMEN - 1 VIEW COMPARISON:  09/16/2021. FINDINGS: Flat plate study in 2 films at 4:54 a.m., 09/18/2021. There is increased opacity in the left lung base consistent with consolidation, atelectasis or aspiration. Enteric catheter has been advanced with tip now superimposing over the left iliac crest. If the patient has had gastric bypass with gastrojejunostomy the tube is probably in a jejunal segment. Postsurgical changes are again noted in the left and right upper quadrant. There is improved gaseous distention of central abdominal large bowel segments. Currently no dilated  small bowel is seen. Retained fecal burden appears somewhat improved particularly in the ascending and descending colon. Colonic gas remains present through into the rectum. Visceral shadows are stable. There is no supine evidence of free air. In all other respects there are no further changes. IMPRESSION: 1. Increasingly dense consolidation in the left lower lobe consistent with atelectasis, pneumonia or aspiration. 2. Improved colonic distention, currently with no small bowel dilatation being seen. 3. Interval advancement of the enteric tube with the tip superimposing over the left iliac crest. If there has been a gastric bypass, the tip of the tube is probably in a jejunal segment past the gastrojejunostomy. Electronically Signed   By: Telford Nab M.D.   On: 09/18/2021 07:07   DG Chest Port 1 View  Result Date: 09/17/2021 CLINICAL DATA:  Hypoxia. 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 Endotracheal tube terminates at the level of the clavicles, well above the carina. Right PICC terminates over the SVC, unchanged. Enteric tube courses into the abdomen with tip not imaged. The cardiac silhouette remains mildly enlarged. Left perihilar and left lower lobe opacities are unchanged. No sizable pleural effusion or pneumothorax is identified. IMPRESSION: Unchanged left perihilar and lower lobe airspace disease concerning for pneumonia. Electronically Signed   By: Logan Bores M.D.   On: 09/17/2021 08:54   ECHOCARDIOGRAM COMPLETE  Result Date: 09/16/2021    ECHOCARDIOGRAM REPORT   Patient Name:   [facility] Date of Exam: 09/16/2021 Medical Rec #:  [id number]      Height:       73.0 in Accession #:    [id number]     Weight:       165.8 lb Date of Birth:  May 20, 1940      BSA:          1.987 m Patient Age:    81 years       BP:           102/60 mmHg Patient Gender: M              HR:  63 bpm. Exam Location:   Inpatient Procedure: 2D Echo, Cardiac Doppler and Color Doppler Indications:    Stroke  History:        Patient has no prior history of Echocardiogram examinations.                 Aortic Valve Disease, Arrythmias:Atrial Fibrillation; Risk                 Factors:Former Smoker and Diabetes. AVR. Acute respiratory                 failure with hypoxia. HFrEF.                 Aortic Valve: valve is present in the aortic position. Procedure                 Date: unknown.  Sonographer:    Clayton Lefort RDCS (AE) Referring Phys: [id number] [place]  Sonographer Comments: Echo performed with patient supine and on artificial respirator. Unable to use Definity due to lack of line access per nurse. IMPRESSIONS  1. Left ventricular ejection fraction, by estimation, is 20 to 25%. The left ventricle has severely decreased function. The left ventricle demonstrates global hypokinesis. Left ventricular diastolic parameters are consistent with Grade I diastolic dysfunction (impaired relaxation).  2. Right ventricular systolic function is mildly reduced. The right ventricular size is mildly enlarged.  3. Left atrial size was mildly dilated.  4. Right atrial size was severely dilated.  5. The mitral valve is normal in structure. No evidence of mitral valve regurgitation. No evidence of mitral stenosis.  6. 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 The aortic valve has been repaired/replaced. Aortic valve regurgitation is not visualized. No aortic stenosis is present. There is a valve present in the aortic position. Procedure Date: unknown. Aortic valve mean gradient measures 8.0 mmHg. Aortic valve Vmax measures 2.00 m/s.  7. The inferior vena cava is normal in size with greater than 50% respiratory variability, suggesting right atrial pressure of 3 mmHg. FINDINGS  Left Ventricle: Left ventricular ejection fraction, by estimation, is 20 to 25%. The left ventricle has severely decreased function.  The left ventricle demonstrates global hypokinesis. The left ventricular internal cavity size was normal in size. There is no left ventricular hypertrophy. Left ventricular diastolic parameters are consistent with Grade I diastolic dysfunction (impaired relaxation). Right Ventricle: The right ventricular size is mildly enlarged. No increase in right ventricular wall thickness. Right ventricular systolic function is mildly reduced. Left Atrium: Left atrial size was mildly dilated. Right Atrium: Right atrial size was severely dilated. Pericardium: There is no evidence of pericardial effusion. Mitral Valve: The mitral valve is normal in structure. Mild to moderate mitral annular calcification. No evidence of mitral valve regurgitation. No evidence of mitral valve stenosis. Tricuspid Valve: The tricuspid valve is normal in structure. Tricuspid valve regurgitation is not demonstrated. No evidence of tricuspid stenosis. 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 The aortic valve has been repaired/replaced. Aortic valve regurgitation is not visualized. No aortic stenosis is present. Aortic valve mean gradient measures 8.0 mmHg. Aortic valve peak gradient measures 16.0 mmHg. Aortic valve area, by VTI measures 1.10 cm. There is a valve present in the aortic position. Procedure Date: unknown. Pulmonic Valve: The pulmonic valve was not well visualized. Pulmonic valve regurgitation is trivial. No evidence of pulmonic stenosis. Aorta: The aortic root is normal in size and structure. Venous: The  inferior vena cava is normal in size with greater than 50% respiratory variability, suggesting right atrial pressure of 3 mmHg. IAS/Shunts: No atrial level shunt detected by color flow Doppler.  LEFT VENTRICLE PLAX 2D LVIDd:         5.30 cm      Diastology LVIDs:         4.90 cm      LV e' medial:    4.13 cm/s LV PW:         1.20 cm      LV E/e' medial:  19.8 LV IVS:        2.00 cm      LV  e' lateral:   9.14 cm/s LVOT diam:     2.00 cm      LV E/e' lateral: 8.9 LV SV:         38 LV SV Index:   19 LVOT Area:     3.14 cm  LV Volumes (MOD) LV vol d, MOD A2C: 158.0 ml LV vol d, MOD A4C: 170.0 ml LV vol s, MOD A2C: 94.3 ml LV vol s, MOD A4C: 139.0 ml LV SV MOD A2C:     63.7 ml LV SV MOD A4C:     170.0 ml LV SV MOD BP:      53.6 ml RIGHT VENTRICLE RV Basal diam:  3.20 cm RV S prime:     6.74 cm/s TAPSE (M-mode): 0.8 cm LEFT ATRIUM             Index        RIGHT ATRIUM           Index LA diam:        3.90 cm 1.96 cm/m   RA Area:     25.90 cm LA Vol (A2C):   66.4 ml 33.42 ml/m  RA Volume:   85.90 ml  43.24 ml/m LA Vol (A4C):   56.6 ml 28.49 ml/m LA Biplane Vol: 66.5 ml 33.47 ml/m  AORTIC VALVE AV Area (Vmax):    1.20 cm AV Area (Vmean):   1.25 cm AV Area (VTI):     1.10 cm AV Vmax:           200.00 cm/s AV Vmean:          131.000 cm/s AV VTI:            0.349 m AV Peak Grad:      16.0 mmHg AV Mean Grad:      8.0 mmHg LVOT Vmax:         76.40 cm/s LVOT Vmean:        52.300 cm/s LVOT VTI:          0.122 m LVOT/AV VTI ratio: 0.35  AORTA Ao Root diam: 2.70 cm Ao Asc diam:  4.00 cm MITRAL VALVE MV Area (PHT): 4.06 cm    SHUNTS MV Decel Time: 187 msec    Systemic VTI:  0.12 m MV E velocity: 81.80 cm/s  Systemic Diam: 2.00 cm Kardie Tobb DO Electronically signed by Berniece Salines DO Signature Date/Time: 09/16/2021/5:54:30 PM    Final    DG CHEST PORT 1 VIEW  Result Date: 09/16/2021 CLINICAL DATA:  PICC line placement. EXAM: PORTABLE CHEST 1 VIEW COMPARISON:  Chest x-ray 09/16/2021 FINDINGS: Endotracheal tube tip is 5.2 cm above the carina. Enteric tube extends below the diaphragm. There is a new right upper extremity PICC terminating in the SVC. Patient is status post cardiac surgery and valve replacement, unchanged.  Cardiac silhouette is enlarged. There is some increasing retrocardiac opacities. No pleural effusion or pneumothorax identified. Osseous structures are stable. IMPRESSION: 1. Right upper  extremity PICC terminates in the SVC. 2. Increasing retrocardiac atelectasis/airspace disease. 3. Stable cardiomegaly. Electronically Signed   By: Ronney Asters M.D.   On: 09/16/2021 15:30   [place] EKG SITE RITE  Result Date: 09/16/2021 If Site Rite image not attached, placement could not be confirmed due to current cardiac rhythm.  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 Patient's last known normal was at 1430 NIH stroke scale of 24, modified Rankin scale of 1. TNK was not given as the patient was on apixaban. EXAM: ULTRASOUND-GUIDED VASCULAR ACCESS DIAGNOSTIC CEREBRAL ANGIOGRAM MECHANICAL THROMBECTOMY FLAT PANEL HEAD CT COMPARISON:  None Available. MEDICATIONS: Ancef 2 g IV. The antibiotic was administered within 1 hour of the procedure Performing surgeon: Dr. Frazier Richards Assistant: Dr. Karenann Cai ANESTHESIA/SEDATION: General anesthesia CONTRAST:  Twenty-five mL of Omnipaque 300 milligram/mL FLUOROSCOPY: Radiation Exposure Index (as provided by the fluoroscopic device): 233 mGy Kerma COMPLICATIONS: SIR Level A - No therapy, no consequence. TECHNIQUE: Informed written consent was obtained from the patient's wife after a thorough discussion of the procedural risks, benefits and alternatives. All questions were addressed. Maximal Sterile Barrier Technique was utilized including caps, mask, sterile gowns, sterile gloves, sterile drape, hand hygiene and skin antiseptic. A timeout was performed prior to the initiation of the procedure. The right groin was prepped and draped in the usual sterile fashion. Using a micropuncture kit and the modified Seldinger technique, access was gained to the right common femoral artery and an 8 [place] by 25 cm sheath was placed. Real-time  ultrasound guidance was utilized for vascular access including the acquisition of a permanent ultrasound image documenting patency of the accessed vessel. Under fluoroscopy, a Zoom 88 guide catheter was navigated over a 5 [place] VTK catheter and a 0.035" Terumo Glidewire into the aortic arch. The catheter was placed into the left common carotid artery and then advanced into the left internal carotid artery under roadmap guidance. The diagnostic catheter was removed. Frontal and lateral angiograms of the head were obtained. FINDINGS: 1. Normal caliber of the right common femoral artery, adequate for vascular access. 2. Occlusion of a proximal left M1/MCA. 3. Delayed collaterals from the left ACA to the left MCA vascular tree. PROCEDURE: Using biplane roadmap, a zoom 071 aspiration catheter was navigated over an Aristotle 24 microguidewire into the cavernous segment of the left ICA. The aspiration catheter was then advanced to the level of occlusion at the left M1 MCA and connected to an aspiration pump. Continuous aspiration was performed for 2 minutes. The guide catheter was connected to a VacLok syringe. The aspiration catheter was subsequently removed under constant aspiration. The guide catheter was aspirated for debris. Then, biplane DSA cerebral angiogram was performed by injecting contrast through the guide catheter which demonstrated complete recanalization of the proximal left MCA and its branches (TICI 3). The MCA and its branches have a normal appearance. No aneurysm, av malformation, dural fistula or vasculitis seen. The venous phase of the cerebral angiogram has a normal appearance with a preferential venous return into the right transverse, sigmoid sinuses and the into the internal jugular vein. Mild narrowing of the left transverse sinus. Flat panel CT of the head  was obtained and post processed in a separate workstation with concurrent attending physician supervision. Selected images were sent to  PACS. Flat panel CT of the head has a normal appearance without evidence of a bleed. Right common femoral artery angiogram was obtained in right anterior oblique view. The puncture is at the level of the common femoral artery. The artery has normal caliber, adequate for closure device. The sheath was exchanged over the wire for a Perclose prostyle which was utilized for access closure. Immediate hemostasis was achieved. IMPRESSION: 1. Successful mechanical thrombectomy for treatment of an occlusion of the proximal left M1/MCA. Complete recanalization (TICI 3) of the left MCA was attained. 2. Flat panel CT head was unremarkable. PLAN: 1. Transfer to ICU. 2. Blood pressure goals of 120-140 mm Hg. Electronically Signed   By: Frazier Richards M.D.   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 Patient's last known normal was at 1430 NIH stroke scale of 24, modified Rankin scale of 1. TNK was not given as the patient was on apixaban. EXAM: ULTRASOUND-GUIDED VASCULAR ACCESS DIAGNOSTIC CEREBRAL ANGIOGRAM MECHANICAL THROMBECTOMY FLAT PANEL HEAD CT COMPARISON:  None Available. MEDICATIONS: Ancef 2 g IV. The antibiotic was administered within 1 hour of the procedure Performing surgeon: Dr. Frazier Richards Assistant: Dr. Karenann Cai ANESTHESIA/SEDATION: General anesthesia CONTRAST:  Twenty-five mL of Omnipaque 300 milligram/mL FLUOROSCOPY: Radiation Exposure Index (as provided by the fluoroscopic device): 706 mGy Kerma COMPLICATIONS: SIR Level A - No therapy, no consequence. TECHNIQUE: Informed written consent was obtained from the patient's wife after a thorough discussion of the procedural risks, benefits and alternatives. All questions were addressed. Maximal Sterile Barrier Technique was utilized including  caps, mask, sterile gowns, sterile gloves, sterile drape, hand hygiene and skin antiseptic. A timeout was performed prior to the initiation of the procedure. The right groin was prepped and draped in the usual sterile fashion. Using a micropuncture kit and the modified Seldinger technique, access was gained to the right common femoral artery and an 8 [place] by 25 cm sheath was placed. Real-time ultrasound guidance was utilized for vascular access including the acquisition of a permanent ultrasound image documenting patency of the accessed vessel. Under fluoroscopy, a Zoom 88 guide catheter was navigated over a 5 [place] VTK catheter and a 0.035" Terumo Glidewire into the aortic arch. The catheter was placed into the left common carotid artery and then advanced into the left internal carotid artery under roadmap guidance. The diagnostic catheter was removed. Frontal and lateral angiograms of the head were obtained. FINDINGS: 1. Normal caliber of the right common femoral artery, adequate for vascular access. 2. Occlusion of a proximal left M1/MCA. 3. Delayed collaterals from the left ACA to the left MCA vascular tree. PROCEDURE: Using biplane roadmap, a zoom 071 aspiration catheter was navigated over an Aristotle 24 microguidewire into the cavernous segment of the left ICA. The aspiration catheter was then advanced to the level of occlusion at the left M1 MCA and connected to an aspiration pump. Continuous aspiration was performed for 2 minutes. The guide catheter was connected to a VacLok syringe. The aspiration catheter was subsequently removed under constant aspiration. The guide catheter was aspirated for debris. Then, biplane DSA cerebral angiogram was performed by injecting contrast through the guide catheter which demonstrated complete recanalization of the  proximal left MCA and its branches (TICI 3). The MCA and its branches have a normal appearance. No aneurysm, av malformation, dural fistula or vasculitis  seen. The venous phase of the cerebral angiogram has a normal appearance with a preferential venous return into the right transverse, sigmoid sinuses and the into the internal jugular vein. Mild narrowing of the left transverse sinus. Flat panel CT of the head was obtained and post processed in a separate workstation with concurrent attending physician supervision. Selected images were sent to PACS. Flat panel CT of the head has a normal appearance without evidence of a bleed. Right common femoral artery angiogram was obtained in right anterior oblique view. The puncture is at the level of the common femoral artery. The artery has normal caliber, adequate for closure device. The sheath was exchanged over the wire for a Perclose prostyle which was utilized for access closure. Immediate hemostasis was achieved. IMPRESSION: 1. Successful mechanical thrombectomy for treatment of an occlusion of the proximal left M1/MCA. Complete recanalization (TICI 3) of the left MCA was attained. 2. Flat panel CT head was unremarkable. PLAN: 1. Transfer to ICU. 2. Blood pressure goals of 120-140 mm Hg. Electronically Signed   By: Frazier Richards M.D.   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 Patient's last known normal was at 1430 NIH stroke scale of 24, modified Rankin scale of 1. TNK was not given as the patient was on apixaban. EXAM: ULTRASOUND-GUIDED VASCULAR ACCESS DIAGNOSTIC CEREBRAL ANGIOGRAM MECHANICAL THROMBECTOMY FLAT PANEL HEAD CT COMPARISON:  None Available. MEDICATIONS: Ancef 2 g IV. The antibiotic was administered within 1 hour of the procedure Performing surgeon: Dr. Frazier Richards Assistant: Dr. Karenann Cai ANESTHESIA/SEDATION: General anesthesia CONTRAST:  Twenty-five mL of Omnipaque 300 milligram/mL FLUOROSCOPY:  Radiation Exposure Index (as provided by the fluoroscopic device): 789 mGy Kerma COMPLICATIONS: SIR Level A - No therapy, no consequence. TECHNIQUE: Informed written consent was obtained from the patient's wife after a thorough discussion of the procedural risks, benefits and alternatives. All questions were addressed. Maximal Sterile Barrier Technique was utilized including caps, mask, sterile gowns, sterile gloves, sterile drape, hand hygiene and skin antiseptic. A timeout was performed prior to the initiation of the procedure. The right groin was prepped and draped in the usual sterile fashion. Using a micropuncture kit and the modified Seldinger technique, access was gained to the right common femoral artery and an 8 [place] by 25 cm sheath was placed. Real-time ultrasound guidance was utilized for vascular access including the acquisition of a permanent ultrasound image documenting patency of the accessed vessel. Under fluoroscopy, a Zoom 88 guide catheter was navigated over a 5 [place] VTK catheter and a 0.035" Terumo Glidewire into the aortic arch. The catheter was placed into the left common carotid artery and then advanced into the left internal carotid artery under roadmap guidance. The diagnostic catheter was removed. Frontal and lateral angiograms of the head were obtained. FINDINGS: 1. Normal caliber of the right common femoral artery, adequate for vascular access. 2. Occlusion of a proximal left M1/MCA. 3. Delayed collaterals from the left ACA to the left MCA vascular tree. PROCEDURE: Using biplane roadmap, a zoom 071 aspiration catheter was navigated over an Aristotle 24 microguidewire into the cavernous segment of the left ICA. The aspiration catheter was then advanced to the  level of occlusion at the left M1 MCA and connected to an aspiration pump. Continuous aspiration was performed for 2 minutes. The guide catheter was connected to a VacLok syringe. The aspiration catheter was subsequently removed  under constant aspiration. The guide catheter was aspirated for debris. Then, biplane DSA cerebral angiogram was performed by injecting contrast through the guide catheter which demonstrated complete recanalization of the proximal left MCA and its branches (TICI 3). The MCA and its branches have a normal appearance. No aneurysm, av malformation, dural fistula or vasculitis seen. The venous phase of the cerebral angiogram has a normal appearance with a preferential venous return into the right transverse, sigmoid sinuses and the into the internal jugular vein. Mild narrowing of the left transverse sinus. Flat panel CT of the head was obtained and post processed in a separate workstation with concurrent attending physician supervision. Selected images were sent to PACS. Flat panel CT of the head has a normal appearance without evidence of a bleed. Right common femoral artery angiogram was obtained in right anterior oblique view. The puncture is at the level of the common femoral artery. The artery has normal caliber, adequate for closure device. The sheath was exchanged over the wire for a Perclose prostyle which was utilized for access closure. Immediate hemostasis was achieved. IMPRESSION: 1. Successful mechanical thrombectomy for treatment of an occlusion of the proximal left M1/MCA. Complete recanalization (TICI 3) of the left MCA was attained. 2. Flat panel CT head was unremarkable. PLAN: 1. Transfer to ICU. 2. Blood pressure goals of 120-140 mm Hg. Electronically Signed   By: Frazier Richards M.D.   On: 09/16/2021 10:22   DG Abd Portable 1V  Addendum Date: 09/16/2021   ADDENDUM REPORT: 09/16/2021 08:27 ADDENDUM: Study discussed by telephone with Nurse Apolonio Schneiders on 09/16/2021 at 0823 hours. Electronically Signed   By: Genevie Ann M.D.   On: 09/16/2021 08:27   Result Date: 09/16/2021 CLINICAL DATA:  81 year old male with projectile vomiting. EXAM: PORTABLE ABDOMEN - 1 VIEW COMPARISON:  09/15/2021. FINDINGS: Portable AP  supine view at 0756 hours. Enteric tube appears pulled back slightly, side hole now at the expected level of the GEJ. Stable epigastric and right upper quadrant surgical clips. Negative visible lung bases. Increasing gas-filled bowel loops throughout the abdomen and pelvis, superimposed on retained stool in the colon. Possible dilated small bowel now. But gas is present to the rectum. No definite pneumoperitoneum on these supine views. No acute osseous abnormality identified. IMPRESSION: 1. Enteric tube has been pulled back, side hole now at the level of the GEJ. Recommend advancing 6 cm to ensure side hole placement inside the stomach. 2. Increasing gas-filled bowel throughout the abdomen and pelvis, most suggestive of Acute Ileus. Electronically Signed: By: Genevie Ann M.D. On: 09/16/2021 08:18   DG CHEST PORT 1 VIEW  Result Date: 09/16/2021 CLINICAL DATA:  81 year old male with history of chest congestion. EXAM: PORTABLE CHEST 1 VIEW COMPARISON:  Chest x-ray 09/16/2021. FINDINGS: An endotracheal tube is in place with tip 7.4 cm above the carina. A nasogastric tube is seen extending into the stomach, however, the tip of the nasogastric tube extends below the lower margin of the image. Lung volumes are normal. No consolidative airspace disease. No pleural effusions. No pneumothorax. No pulmonary nodule or mass noted. Pulmonary vasculature and the cardiomediastinal silhouette are within normal limits. 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 IMPRESSION: 1. Support apparatus, as above. 2. No  radiographic evidence of acute cardiopulmonary disease. 3. Aortic atherosclerosis. Electronically Signed   By: Vinnie Langton M.D.   On: 09/16/2021 08:12   DG Chest Port 1 View  Result Date: 09/16/2021 CLINICAL DATA:  81 year old male with history of stroke. EXAM: PORTABLE CHEST 1 VIEW COMPARISON:  Chest x-ray 09/15/2021. FINDINGS: An  endotracheal tube is in place with tip 7.0 cm above the carina. A nasogastric tube is seen extending into the stomach, however, the tip of the nasogastric tube extends below the lower margin of the image. Lung volumes are normal. No consolidative airspace disease. No pleural effusions. No pneumothorax. No evidence of pulmonary edema. Heart size is mildly enlarged. Upper mediastinal contours are within normal limits. Atherosclerotic calcifications are noted in the thoracic aorta. Status post median sternotomy for aortic valve replacement (a stented bioprosthesis is noted). IMPRESSION: 1. Postoperative changes and support apparatus, as above. 2. No radiographic evidence of acute cardiopulmonary disease. 3. Mild cardiomegaly. 4. Aortic atherosclerosis. Electronically Signed   By: Vinnie Langton M.D.   On: 09/16/2021 05:50   MR ANGIO HEAD WO CONTRAST  Result Date: 09/16/2021 CLINICAL DATA:  81 year old male with paroxysmal atrial fibrillation on Eliquis. New onset aphasia, right side weakness. Code stroke presentation yesterday. Left M1 occlusion by CTA. EXAM: MRA HEAD WITHOUT CONTRAST TECHNIQUE: Angiographic images of the Circle of Willis were acquired using MRA technique without intravenous contrast. COMPARISON:  MRI brain today reported separately. CTA head and neck yesterday. FINDINGS: Anterior circulation: Antegrade flow in both ICA siphons with bilateral siphon irregularity in keeping with the atherosclerosis and stenosis better demonstrated by CTA. Carotid termini remain patent. And now the left MCA M1 segment, anterior temporal artery origin, and left MCA bifurcation are patent. Left MCA branches appear not significantly changed from yesterday, including a long segment M2 stenosis demonstrated in the posterior division on series 1035, image 4. Right MCA M1 segment is patent but with moderate to severe stenosis upstream of the right MCA bifurcation, concordant with CTA appearance yesterday. Likewise, right  MCA branches appear stable with mild irregularity. Anterior communicating artery and visible ACA branches are stable, mild right A2 stenosis. Posterior circulation: Antegrade flow in the dominant distal left vertebral artery with patent left PICA origin. No distal left vertebral stenosis. Asymmetrically decreased flow in the smaller distal right vertebral artery, which did appear atherosclerotic and stenotic on the CTA yesterday but was patent at that time to the vertebrobasilar junction. Right PICA origin appears to remain patent. Patent basilar artery without stenosis. Patent SCA and PCA origins. Bilateral PCA branches appear stable, within normal limits. Anatomic variants: Dominant left vertebral V4 segment. Other: Brain MRI reported separately today. IMPRESSION: 1. Recanalized Left MCA M1 segment. Patent left MCA bifurcation. Upstream ICA atherosclerosis and stenosis, and downstream MCA M2 branch stenosis appears unchanged from the CTA yesterday. 2. Elsewhere stable intracranial atherosclerosis and stenosis, most notably both ICA siphons, Right MCA bifurcation, distal Right Vertebral Artery. Electronically Signed   By: Genevie Ann M.D.   On: 09/16/2021 05:49   MR BRAIN WO CONTRAST  Result Date: 09/16/2021 CLINICAL DATA:  81 year old male with paroxysmal atrial fibrillation on Eliquis. New onset aphasia, right side weakness. Code stroke presentation yesterday. Left M1 occlusion. EXAM: MRI HEAD WITHOUT CONTRAST TECHNIQUE: Multiplanar, multiecho pulse sequences of the brain and surrounding structures were obtained without intravenous contrast. COMPARISON:  CT head and CTA head and neck yesterday. FINDINGS: Brain: Patchy restricted diffusion throughout the left basal ganglia including the caudate and the lentiform. There is some  involvement of the posterior limb of the left external capsule. And there is some mesial temporal lobe involvement on series 5, image 73. But otherwise Insula and other left MCA territory  cortex seems spared. No contralateral right hemisphere or posterior fossa restricted diffusion. Multiple chronic generally small cerebellar infarcts on the right. Patchy and confluent bilateral cerebral white matter T2 and FLAIR hyperintensity with a small area of chronic left middle frontal gyrus encephalomalacia (series 10, image 19). There are several chronic microhemorrhages also in the left frontal lobe (series 22, image 35). No midline shift, mass effect, evidence of mass lesion, ventriculomegaly, extra-axial collection or acute intracranial hemorrhage. Cervicomedullary junction and pituitary are within normal limits. Vascular: Major intracranial vascular flow voids are preserved except for the distal right vertebral artery, which was non dominant but patent by CTA yesterday. Skull and upper cervical spine: Negative. Visualized bone marrow signal is within normal limits. Sinuses/Orbits: Postoperative changes to both globes. Paranasal Visualized paranasal sinuses and mastoids are stable and well aerated. Other: Intubated. Small volume fluid in the pharynx and nasal cavity. Grossly normal visible internal auditory structures. IMPRESSION: 1. Patchy acute infarct in the left basal ganglia and the left mesial temporal lobe with no associated hemorrhage or mass effect. 2. Underlying chronic small and medium-sized vessel ischemia. 3. MRA Head reported separately today. Electronically Signed   By: Genevie Ann M.D.   On: 09/16/2021 05:23   DG Abd Portable 1V  Result Date: 09/15/2021 CLINICAL DATA:  OG tube placement. EXAM: PORTABLE ABDOMEN - 1 VIEW COMPARISON:  None Available. FINDINGS: Tip and side port of the enteric tube below the diaphragm in the stomach. Enteric chain sutures in the left upper quadrant as well as multiple surgical clips. Surgical clips in the right upper quadrant. No bowel dilatation or evidence of obstruction in the included upper abdomen. IMPRESSION: Tip and side port of the enteric tube below  the diaphragm in the stomach. Electronically Signed   By: Keith Rake M.D.   On: 09/15/2021 21:23   DG CHEST PORT 1 VIEW  Result Date: 09/15/2021 CLINICAL DATA:  Intubation. EXAM: PORTABLE CHEST 1 VIEW COMPARISON:  10/02/2020 FINDINGS: Endotracheal tube tip 5.4 cm from the carina at the level of the clavicular heads. Enteric tube in place with tip below the diaphragm not included on this chest field of view. Prior median sternotomy prosthetic aortic valve. Cardiomegaly. Hazy opacity at the left greater than right lung base likely represents combination of atelectasis and pleural effusions. Vascular congestion. No pneumothorax. IMPRESSION: 1. Endotracheal tube tip 5.4 cm from the carina at the level of the clavicular heads. Enteric tube in place with tip below the diaphragm. 2. Cardiomegaly. Vascular congestion. Hazy bibasilar lung opacities likely combination of pleural effusions and atelectasis. Suspect CHF. Electronically Signed   By: Keith Rake M.D.   On: 09/15/2021 21:21   CT ANGIO HEAD NECK W WO CM (CODE STROKE)  Result Date: 09/15/2021 CLINICAL DATA:  Provided history: Neuro deficit, acute, stroke suspected; right-sided weakness and facial droop. EXAM: CT ANGIOGRAPHY HEAD AND NECK TECHNIQUE: Multidetector CT imaging of the head and neck was performed using the standard protocol during bolus administration of intravenous contrast. Multiplanar CT image reconstructions and MIPs were obtained to evaluate the vascular anatomy. Carotid stenosis measurements (when applicable) are obtained utilizing NASCET criteria, using the distal internal carotid diameter as the denominator. RADIATION DOSE REDUCTION: This exam was performed according to the departmental dose-optimization program which includes automated exposure control, adjustment of the mA and/or kV  according to patient size and/or use of iterative reconstruction technique. CONTRAST:  42m OMNIPAQUE IOHEXOL 350 MG/ML SOLN COMPARISON:   Noncontrast head CT performed immediately prior 09/15/2021. FINDINGS: CTA NECK FINDINGS Aortic arch: Standard aortic branching. Atherosclerotic plaque within the visualized aortic arch and proximal major branch vessels of the neck. No hemodynamically significant innominate or proximal subclavian artery stenosis. Right carotid system: CCA and ICA patent within the neck. Mild scattered atherosclerotic plaque within the proximal to mid CCA. Prominent predominantly calcified plaque about the carotid bifurcation and within the proximal ICA. Resultant in 50-60% stenosis at the origin of the right ICA. Left carotid system: CCA and ICA patent within the neck. Mild atherosclerotic plaque scattered within the proximal to mid CCA. Prominent atherosclerotic plaque about the carotid bifurcation and within the proximal ICA. Resultant 40% stenosis of the proximal ICA. Vertebral arteries: Vertebral arteries patent within the neck. The left vertebral artery is dominant. Severe atherosclerotic stenosis at the origin of the right vertebral artery. Mild atherosclerotic narrowing at the origin of the left vertebral artery. Skeleton: Cervical spondylosis. No acute fracture or aggressive osseous lesion. Other neck: No neck mass or cervical lymphadenopathy. Upper chest: Prior median sternotomy. Smooth interlobular septal thickening within the imaged lung apices, likely reflecting interstitial edema. Partially imaged right pleural effusion. Review of the MIP images confirms the above findings CTA HEAD FINDINGS Anterior circulation: The intracranial internal carotid arteries are patent. Atherosclerotic plaque within both vessels. Most notably, there is moderate atherosclerotic narrowing of the distal cavernous/paraclinoid right ICA, and severe stenosis of the distal cavernous/paraclinoid left ICA. The left M1 MCA segment is occluded at its origin. There is some reconstitution of enhancement within M2 and more distal left MCA vessels. The  right M1 segment is patent. Atherosclerotic irregularity of the M2 and more distal right MCA vessels. No appreciable right M2 proximal branch occlusion or high-grade proximal stenosis. The anterior cerebral arteries are patent. Posterior circulation: The intracranial vertebral arteries are patent. Nonstenotic atherosclerotic plaque within the non dominant V4 right vertebral artery. The basilar artery is patent. The posterior cerebral arteries are patent. Atherosclerotic irregularity of both vessels without high-grade proximal stenosis. Posterior communicating arteries are diminutive or absent, bilaterally. Venous sinuses: Within the limitations of contrast timing, no convincing thrombus. Anatomic variants: As described. Review of the MIP images confirms the above findings CTA head impression #1 called by telephone at the time of interpretation on 09/15/2021 at 3:50 pm to provider HIsla Pence who verbally acknowledged these results. IMPRESSION: CTA neck: 1. The common carotid, internal carotid and vertebral arteries are patent within the neck. 2. Atherosclerotic narrowing at the origin of the right ICA of 50-60%. 3. Atherosclerotic narrowing at the origin of the left ICA of 40%. 4. Severe stenosis at the origin of the non-dominant right vertebral artery. 5. Mild atherosclerotic narrowing at the origin of the dominant left vertebral artery. 6.  Aortic Atherosclerosis (ICD10-I70.0). 7. Interstitial edema within the imaged lung apices. 8. Partially imaged right pleural effusion. CTA head: 1. The M1 segment of the left middle cerebral artery is occluded at its origin. There is some reconstitution of enhancement within M2 and more distal left MCA vessels, likely due to collateral flow. 2. 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 3. Severe stenosis of the distal cavernous/paraclinoid left ICA. 4. Moderate stenosis of the distal cavernous/paraclinoid right ICA.  Electronically Signed   By: KKellie SimmeringD.O.   On: 09/15/2021 16:20   CT HEAD CODE STROKE WO CONTRAST  Result Date: 09/15/2021 CLINICAL DATA:  Code stroke. Neuro deficit, acute, stroke suspected. Right-sided weakness and facial droop. EXAM: CT HEAD WITHOUT CONTRAST TECHNIQUE: Contiguous axial images were obtained from the base of the skull through the vertex without intravenous contrast. RADIATION DOSE REDUCTION: This exam was performed according to the departmental dose-optimization program which includes automated exposure control, adjustment of the mA and/or kV according to patient size and/or use of iterative reconstruction technique. COMPARISON:  No pertinent prior exams available for comparison. FINDINGS: Brain: Mild generalized cerebral atrophy. Suspected subtle loss of gray-white differentiation within the left insula (for instance as seen on series 2, image 18). Small left MCA territory cortical/subcortical infarct within the left parietal lobe (involving the left postcentral gyrus). This is favored chronic. Background mild patchy and ill-defined hypoattenuation within the cerebral white matter, nonspecific but compatible chronic small vessel ischemic disease. This includes a chronic lacunar infarct within the right centrum semiovale/corona radiata. 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 There is no acute intracranial hemorrhage. No extra-axial fluid collection. No evidence of an intracranial mass. No midline shift. Vascular: Hyperdense M1 left MCA segment, highly suspicious for presence of endoluminal thrombus. Skull: No fracture or aggressive osseous lesion. Sinuses/Orbits: No mass or acute finding within the imaged orbits. Small mucous retention cyst versus polyp within the left maxillary sinus. Minimal mucosal thickening within the right sphenoid sinus and scattered within the bilateral ethmoid sinuses. ASPECTS ([place] Stroke Program Early CT Score) - Ganglionic level  infarction (caudate, lentiform nuclei, internal capsule, insula, M1-M3 cortex): 6 - Supraganglionic infarction (M4-M6 cortex): 3 Total score (0-10 with 10 being normal): 9 Critical/emergent results were called by telephone at the time of interpretation on 09/15/2021 at 3:45 pm to provider JULIE HAVILAND , who verbally acknowledged these results. IMPRESSION: 1. Hyperdense left M1 MCA segment highly suspicious for the presence of endoluminal thrombus. 2. Suspected subtle acute infarct involving the left insula. ASPECTS is 9. 3. Small left MCA territory cortically-based infarct within the left parietal lobe (with involvement of the postcentral gyrus), favored chronic. 4. Background mild chronic small ischemic changes within the cerebral white matter. This includes a chronic lacunar infarct within the right centrum semiovale/corona radiata. 5. 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 6. Mild generalized parenchymal atrophy. Electronically Signed   By: Kellie Simmering D.O.   On: 09/15/2021 15:50     PHYSICAL EXAM  Temp:  [97.3 F (36.3 C)-98.3 F (36.8 C)] 97.3 F (36.3 C) (06/18 0814) Pulse Rate:  [54-88] 81 (06/18 0814) Resp:  [13-29] 19 (06/18 0814) BP: (138-183)/(57-64) 183/61 (06/18 0814) SpO2:  [92 %-98 %] 96 % (06/18 0814) Arterial Line BP: (129-173)/(51-70) 164/61 (06/18 0700) FiO2 (%):  [40 %] 40 % (06/17 1044)  General - Well nourished, well developed, lethargic.  Ophthalmologic - fundi not visualized due to noncooperation.  Cardiovascular - irregularly irregular heart rate and rhythm.  Neuro - lethargic but eyes open on voice, able to follow all simple commands, no aphasia but paucity of speech, able to name and repeat in dysarthric voice. Eyes mid position, visual field full, no gaze palsy, PERRL. Mild right facial droop and tongue protrusion not complete. BUE 3/5 and slight drift bilaterally, b/l LE at least 3-/5 bilaterally symmetrical. DTR 1+ and no  babinski. Sensation symmetrical, coordination b/l FTN grossly intact but slow on action, and gait not tested.    ASSESSMENT/PLAN Mr. Arihant Pennings is a 81 y.o. male with history of CHF, A-fib on Eliquis CKD, hypertension, diabetes, CHF,  PE in 2022, chronic DVT, aortic stenosis status post AVR admitted for aphasia, right-sided weakness, right facial droop. No tPA given due to on Eliquis.    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 IR with left M1 occlusion s/p TICI3 reperfusion MRI left BG, CR and mesial temporal lobe small infarcts MRA left M1 occlusion recannulized, patent left MCA.  Stable intracranial atherosclerosis and stenosis most notably both ICA siphon, right MCA bifurcation and distal right VA 2D Echo EF 20 to 25% LDL 66 HgbA1c 5.9 Heparin subcu for VTE prophylaxis Eliquis (apixaban) daily prior to admission, now on heparin IV. Transition to po once po access.  Ongoing aggressive stroke risk factor management Therapy recommendations: CIR Disposition: Pending  Respiratory failure Aspiration Intubated for procedure and airway protection Had a massive aspiration 6/15  On Rocephin Extubated 6/17 and tolerating well CCM on board  Chronic A-fib On Eliquis at home Rate controlled Also on amiodarone and metoprolol Follow-up with Dr. Jac Canavan at [facility] now on heparin IV, transition to po once po access  Cardiomyopathy EF 20 to 25% Follows with Dr. Jac Canavan at [facility] Not on significant heart failure regimen given hypotension and bradycardia. now on heparin IV, transition to po once po access  Ileus,  improving Likely the cause of large vomitus with aspiration Bowel rest NPO now CCM on board  Diabetes HgbA1c 5.9 goal < 7.0 Controlled CBG monitoring SSI DM education and close PCP follow up  History of hypertension Hypotension BP on the low end On Levophed CCM on board Long term BP goal normotensive  Lipid management Home meds: None LDL 66, goal < 70 No statin for now given advanced age and LDL at goal as well as no po access  AKI on CKD CKD 3, creatinine 1.60-1.39->2.43->1.54 CCM on board Avoid nephrotoxic meds  Other Stroke Risk Factors Advanced age PE in 2022 Chronic DVT  Other Active Problems AS s/p bioprosthetic aortic valve    Hospital day # 4  This patient is critically ill due to left MCA stroke, left M1 occlusion s/p IR, respiratory failure with aspiration pneumonia, severe CHF, chronic afib, ileus with vomiting and at significant risk of neurological worsening, death form heart failure, recurrent stroke, hemorrhagic conversion, renal failure, seizure. This patient's care requires constant monitoring of vital signs, hemodynamics, respiratory and cardiac monitoring, review of multiple databases, neurological assessment, discussion with family, other specialists and medical decision making of high complexity. I spent 40 minutes of neurocritical care time in the care of this patient. I had long discussion with wife at bedside, updated pt current condition, treatment plan and potential prognosis, and answered all the questions. She expressed understanding and appreciation.    Rosalin Hawking, MD PhD Stroke Neurology 09/19/2021 9:52 AM    To contact Stroke Continuity provider, please refer to [email or web address] After hours, contact General Neurology

## 2021-09-19 NOTE — Evaluation (Signed)
Speech Language Pathology Evaluation Patient Details Name: Eugene Campbell MRN: [id number] DOB: June 10, 1940 Today's Date: 09/19/2021 Time: 4098-1191 SLP Time Calculation (min) (ACUTE ONLY): 12 min  Problem List:  Patient Active Problem List   Diagnosis Date Noted   Protein-calorie malnutrition, severe 09/17/2021   Aspiration pneumonia of both lungs due to gastric secretions ([name])    Acute ischemic stroke ([name]) 09/15/2021   Acute respiratory failure with hypoxia ([name])    Longstanding persistent atrial fibrillation ([name])    Past Medical History:  Past Medical History:  Diagnosis Date   Aortic stenosis    Chronic renal impairment, stage 3 (moderate), unspecified whether stage 3a or 3b CKD ([name])    Diabetes ([name])    HFrEF (heart failure with reduced ejection fraction) ([name])    Hypothyroidism    PE (pulmonary thromboembolism) ([name]) 2022   Past Surgical History:  Past Surgical History:  Procedure Laterality Date   AORTIC VALVE REPLACEMENT     CHOLECYSTECTOMY     IR CT HEAD LTD  09/15/2021   IR PERCUTANEOUS ART THROMBECTOMY/INFUSION INTRACRANIAL INC DIAG ANGIO  09/15/2021   IR US GUIDE VASC ACCESS RIGHT  09/15/2021   RADIOLOGY WITH ANESTHESIA N/A 09/15/2021   Procedure: IR WITH ANESTHESIA;  Surgeon: Radiologist, Medication, MD;  Location: [facility] OR;  Service: Radiology;  Laterality: N/A;   HPI:  Pt is an 81 y/o male who presented with sudden onset of R sided weakness, facial droop and aphasia. 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 Pt remained intubated after procedure with complications due to acute hypoxic respiratory failure and massive aspiration. Pt extubated 6/17. PMH: a fib, HTN, PT and DVT, clotting disorder, DM2   Assessment / Plan / Recommendation Clinical Impression  Pt participated in initial cognitive/communication eval s/p CVA. He was lethargic today, which impacted  performance. Speech was fluent; subtle dysarthria present. Able to name to confrontation; mild deficits in responsive naming; mod deficits with divergent naming. He followed simple commands; had more difficulty with multistep commands. He had difficulty shifting task set.  Short-term recall impaired. Discussed with his wife our plan for ongoing assessment as he becomes more awake and his confusion subsides.  SLP will follow for basic communication/cognition - will benefit from AIR.    SLP Assessment  SLP Recommendation/Assessment: Patient needs continued Speech Lanaguage Pathology Services SLP Visit Diagnosis: Cognitive communication deficit (R41.841)    Recommendations for follow up therapy are one component of a multi-disciplinary discharge planning process, led by the attending physician.  Recommendations may be updated based on patient status, additional functional criteria and insurance authorization.    Follow Up Recommendations  Acute inpatient rehab (3hours/day)    Assistance Recommended at Discharge  Frequent or constant Supervision/Assistance  Functional Status Assessment Patient has had a recent decline in their functional status and demonstrates the ability to make significant improvements in function in a reasonable and predictable amount of time.  Frequency and Duration min 2x/week  2 weeks      SLP Evaluation Cognition  Overall Cognitive Status: Impaired/Different from baseline Arousal/Alertness: Lethargic Orientation Level: Oriented to person;Oriented to place;Oriented to situation;Disoriented to time Attention: Selective Selective Attention: Impaired Selective Attention Impairment: Verbal basic Awareness: Impaired       Comprehension  Auditory Comprehension Overall Auditory Comprehension: Impaired Yes/No Questions: Within Functional Limits Commands: Impaired One Step Basic Commands: 75-100% accurate Two Step Basic Commands: 75-100% accurate Multistep Basic  Commands: 0-24%  accurate Conversation: Simple Reading Comprehension Reading Status: Not tested    Expression Expression Primary Mode of Expression: Verbal Verbal Expression Overall Verbal Expression: Impaired Initiation: No impairment Level of Generative/Spontaneous Verbalization: Sentence Repetition: No impairment Naming: Impairment Responsive: 76-100% accurate Confrontation: Within functional limits Divergent: 25-49% accurate Written Expression Dominant Hand: Right   Oral / Motor  Oral Motor/Sensory Function Overall Oral Motor/Sensory Function: Within functional limits Motor Speech Overall Motor Speech: Appears within functional limits for tasks assessed            Eugene Campbell 09/19/2021, 3:38 PM [name] Frederic, MA CCC/SLP Clinical Specialist - Acute Care SLP Acute Rehabilitation Services Office number [phone]

## 2021-09-19 NOTE — Progress Notes (Signed)
ANTICOAGULATION CONSULT NOTE - Follow-up Consult  Pharmacy Consult for IV heparin Indication: atrial fibrillation  Allergies  Allergen Reactions   Penicillins Hives, Swelling, Rash and Other (See Comments)    Peeling skin and caused the gums to peel/crack    Patient Measurements: Height: 6\' 1"  (185.4 cm) Weight: 75.2 kg (165 lb 12.6 oz) IBW/kg (Calculated) : 79.9 Heparin Dosing Weight: 75 kg  Vital Signs: Temp: 98.3 F (36.8 C) (06/18 1531) Temp Source: Oral (06/18 1531) BP: 123/60 (06/18 1400) Pulse Rate: 64 (06/18 1400)  Labs: Recent Labs    09/17/21 0418 09/17/21 2000 09/18/21 0345 09/19/21 0347 09/19/21 1410 09/19/21 1513  HGB 15.8  --  13.9 12.5*  --   --   HCT 46.3  --  40.5 37.3*  --   --   PLT 104*  --  73* 65*  --   --   HEPARINUNFRC  --    < > 0.41 0.15* <0.10* 0.10*  CREATININE 2.43*  --  2.12* 1.54*  --   --    < > = values in this interval not displayed.     Estimated Creatinine Clearance: 40.7 mL/min (A) (by C-G formula based on SCr of 1.54 mg/dL (H)).   Medical History: Past Medical History:  Diagnosis Date   Aortic stenosis    Chronic renal impairment, stage 3 (moderate), unspecified whether stage 3a or 3b CKD ([name])    Diabetes ([name])    HFrEF (heart failure with reduced ejection fraction) ([name])    Hypothyroidism    PE (pulmonary thromboembolism) ([name]) 2022    Medications:  Infusions:   sodium chloride Stopped (09/16/21 1548)   cefTRIAXone (ROCEPHIN)  IV 2 g (09/19/21 1413)   dextrose 75 mL/hr at 09/19/21 1400   heparin 1,200 Units/hr (09/19/21 1440)    Assessment: 81 y.o. male who per Care Everywhere has PMH including but not limited to A.fib on Eliquis and amiodarone, HTN, PE and DVT, clotting disorder (unknown what exactly), DM2 without long term insulin use, hypothyroidism. Presenting with CVA s/p MT. Pharmacy consulted for IV heparin  Heparin level remains low (0.1 on recheck), noted it was <0.1 prior to stat recheck. No issues  with line or bleeding reported per RN.  Goal of Therapy:  Heparin level 0.3-0.5 units/ml Monitor platelets by anticoagulation protocol: Yes   Plan:  Increase heparin rate to 1350 units/hr Will f/u 8 hr heparin level  96, PharmD, BCPS Please see amion for complete clinical pharmacist phone list 09/19/2021 4:07 PM

## 2021-09-19 NOTE — Progress Notes (Signed)
RT at bedside this AM to find pt on 4L [place] and BiPAP on stby. Pt tolerating well at this time with SVS. RT will continue to monitor pt.

## 2021-09-19 NOTE — Evaluation (Signed)
Clinical/Bedside Swallow Evaluation Patient Details  Name: Eugene Campbell MRN: [id number] Date of Birth: [birth year]  Today's Date: 09/19/2021 Time: SLP Start Time (ACUTE ONLY): 1355 SLP Stop Time (ACUTE ONLY): 1407 SLP Time Calculation (min) (ACUTE ONLY): 12 min  Past Medical History:  Past Medical History:  Diagnosis Date   Aortic stenosis    Chronic renal impairment, stage 3 (moderate), unspecified whether stage 3a or 3b CKD ([name])    Diabetes ([name])    HFrEF (heart failure with reduced ejection fraction) ([name])    Hypothyroidism    PE (pulmonary thromboembolism) ([name]) 2022   Past Surgical History:  Past Surgical History:  Procedure Laterality Date   AORTIC VALVE REPLACEMENT     CHOLECYSTECTOMY     IR CT HEAD LTD  09/15/2021   IR PERCUTANEOUS ART THROMBECTOMY/INFUSION INTRACRANIAL INC DIAG ANGIO  09/15/2021   IR US GUIDE VASC ACCESS RIGHT  09/15/2021   RADIOLOGY WITH ANESTHESIA N/A 09/15/2021   Procedure: IR WITH ANESTHESIA;  Surgeon: Radiologist, Medication, MD;  Location: [facility] OR;  Service: Radiology;  Laterality: N/A;   HPI:  Pt is an 81 y/o male who presented with sudden onset of R sided weakness, facial droop and aphasia. 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 Pt remained intubated after procedure with complications due to acute hypoxic respiratory failure and massive aspiration. Pt extubated 6/17. PMH: a fib, HTN, PT and DVT, clotting disorder, DM2    Assessment / Plan / Recommendation  Clinical Impression  Pt participated in clinical swallowing assessment. He was sleepy, confused, interactive with prompting.  Wife at bedside. No obvious focal CN deficits; edentulous. Tongue with likely thrush.  Voice wet; congested cough at baseline.  Followed simple commands. Consumed limited amounts of ice chips, sips of water, and bites of applesauce with adequate oral attention and no overt  s/s of aspiration - however, lethargy limited extent of evaluation. Recommend allowing ice chips, sips of water, and meds whole in applesauce for this afternoon/tonight.  SLP will follow for readiness to resume a diet. Discussed with RN, Mrs. Logiudice, who agree with plan. SLP Visit Diagnosis: Dysphagia, unspecified (R13.10)    Aspiration Risk  Mild aspiration risk    Diet Recommendation   Sips of water, ice chips for now  Medication Administration: Whole meds with puree    Other  Recommendations Oral Care Recommendations: Oral care QID    Recommendations for follow up therapy are one component of a multi-disciplinary discharge planning process, led by the attending physician.  Recommendations may be updated based on patient status, additional functional criteria and insurance authorization.  Follow up Recommendations Acute inpatient rehab (3hours/day)      Assistance Recommended at Discharge Frequent or constant Supervision/Assistance  Functional Status Assessment Patient has had a recent decline in their functional status and demonstrates the ability to make significant improvements in function in a reasonable and predictable amount of time.  Frequency and Duration min 2x/week  2 weeks       Prognosis Prognosis for Safe Diet Advancement: Good      Swallow Study   General Date of Onset: 09/15/21 HPI: Pt is an 81 y/o male who presented with sudden onset of R sided weakness, facial droop and aphasia. 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 Pt remained intubated after procedure with complications due  to acute hypoxic respiratory failure and massive aspiration. Pt extubated 6/17. PMH: a fib, HTN, PT and DVT, clotting disorder, DM2 Type of Study: Bedside Swallow Evaluation Previous Swallow Assessment: no Diet Prior to this Study: NPO Temperature Spikes Noted: No Respiratory Status:  Nasal cannula History of Recent Intubation: Yes Length of Intubations (days): 3 days Date extubated: 09/18/21 Behavior/Cognition: Alert;Confused Oral Cavity Assessment: Other (comment) (thrush-like appearance tongue) Oral Care Completed by SLP: Recent completion by staff Oral Cavity - Dentition: Edentulous Self-Feeding Abilities: Needs assist Patient Positioning: Upright in bed Baseline Vocal Quality: Wet Volitional Cough: Congested Volitional Swallow: Able to elicit    Oral/Motor/Sensory Function Overall Oral Motor/Sensory Function: Within functional limits   Ice Chips Ice chips: Within functional limits   Thin Liquid Thin Liquid: Within functional limits    Nectar Thick Nectar Thick Liquid: Not tested   Honey Thick Honey Thick Liquid: Not tested   Puree Puree: Within functional limits   Solid     Solid: Not tested      [name] 09/19/2021,3:29 PM  [name] Frederic, MA CCC/SLP Clinical Specialist - Acute Care SLP Acute Rehabilitation Services Office number [phone]

## 2021-09-19 NOTE — Progress Notes (Signed)
ANTICOAGULATION CONSULT NOTE - Follow-up Consult  Pharmacy Consult for IV heparin Indication: atrial fibrillation  Allergies  Allergen Reactions   Penicillins Hives, Swelling, Rash and Other (See Comments)    Peeling skin and caused the gums to peel/crack    Patient Measurements: Height: 6\' 1"  (185.4 cm) Weight: 75.2 kg (165 lb 12.6 oz) IBW/kg (Calculated) : 79.9 Heparin Dosing Weight: 75 kg  Vital Signs: Temp: 97.6 F (36.4 C) (06/18 1100) Temp Source: Oral (06/18 1100) BP: 123/60 (06/18 1400) Pulse Rate: 64 (06/18 1400)  Labs: Recent Labs    09/17/21 0418 09/17/21 2000 09/18/21 0345 09/19/21 0347 09/19/21 1410  HGB 15.8  --  13.9 12.5*  --   HCT 46.3  --  40.5 37.3*  --   PLT 104*  --  73* 65*  --   HEPARINUNFRC  --    < > 0.41 0.15* <0.10*  CREATININE 2.43*  --  2.12* 1.54*  --    < > = values in this interval not displayed.     Estimated Creatinine Clearance: 40.7 mL/min (A) (by C-G formula based on SCr of 1.54 mg/dL (H)).   Medical History: Past Medical History:  Diagnosis Date   Aortic stenosis    Chronic renal impairment, stage 3 (moderate), unspecified whether stage 3a or 3b CKD ([name])    Diabetes ([name])    HFrEF (heart failure with reduced ejection fraction) ([name])    Hypothyroidism    PE (pulmonary thromboembolism) ([name]) 2022    Medications:  Infusions:   sodium chloride Stopped (09/16/21 1548)   cefTRIAXone (ROCEPHIN)  IV 2 g (09/19/21 1413)   dextrose 75 mL/hr at 09/19/21 1400   heparin 1,200 Units/hr (09/19/21 1440)    Assessment: 81 y.o. male who per Care Everywhere has PMH including but not limited to A.fib on Eliquis and amiodarone, HTN, PE and DVT, clotting disorder (unknown what exactly), DM2 without long term insulin use, hypothyroidism. Presenting with CVA s/p MT. Pharmacy consulted for IV heparin  Heparin level now undetectable despite rate increase this morning, drawn appropriately per discussion with RN but would like to repeat  level to confirm. Hg down to 12.5, plt down to 65. No bleeding or issues with infusion per discussion with RN.   Goal of Therapy:  Heparin level 0.3-0.7 units/ml Monitor platelets by anticoagulation protocol: Yes   Plan:  Continue heparin drip at current rate 1200 units/hr for now pending stat repeat heparin level to confirm Monitor daily CBC, s/sx bleeding   96, PharmD, BCPS Please check AMION for all [facility] contact numbers Clinical Pharmacist 09/19/2021 3:01 PM

## 2021-09-19 NOTE — Progress Notes (Signed)
NAME:  Anquan Azzarello, MRN:  [id number], DOB:  Jan 07, 1941, LOS: 4 ADMISSION DATE:  09/15/2021, CONSULTATION DATE:  6/14 REFERRING MD:  Thomasena Edis, CHIEF COMPLAINT:  Stroke   History of Present Illness:  81 y/o male who presented to [facility] on 6/14 due to R hemiplegia due to a L M1 MCA occlusion, intubated for the procedure and vomited and aspirated the day after the procedure while still intubated.  Pertinent  Medical History  Atrial fibrillation Aspiration pneumonia Hypertension PE and DVT DM2 Hypothyroidism Chronic HFrEF 20-25%  Significant Hospital Events: Including procedures, antibiotic start and stop dates in addition to other pertinent events   6/14 admit [facility] 6/14 > hyperdense L M1 MCA segment with suspected subtle acute infarct involving left insula.  CTA head 6/14 > occluded M1 segment of L MCA.CTH demonstrated hyperdense L M1 MCA segment with suspected subtle acute infarct involving left insula. CTA head demonstrated occluded M1 segment of L MCA. MRI brain 6/15 > patchy acute infarct in the left BG and left medial temporal lobe. MRA brain 6/15 > Recanalized Left MCA M1 segment. Patent left MCA bifurcation. Upstream ICA atherosclerosis and stenosis, and downstream MCA M2 branch stenosis appears unchanged from the CTA yesterday. Echo 6/15 >  LVEF 20-25%, global hypokinesis. Grade 1 DD. Well seated bioprosthetic aortic valve.  6/15 massive aspiration. Increasing vent settings. Pressors  Interim History / Subjective:   Lethargic today, but awake and able to speak and answer questions, moves all four extremities Hasn't had a bowel movement yet  Objective   Blood pressure (!) 167/62, pulse 62, temperature 98 F (36.7 C), temperature source Oral, resp. rate (!) 25, height 6\' 1"  (1.854 m), weight 75.2 kg, SpO2 96 %.    Vent Mode: BIPAP;PCV FiO2 (%):  [40 %] 40 % Set Rate:  [10 bmp-18 bmp] 15 bmp Vt Set:  [540 mL-630 mL] 540 mL PEEP:  [5 cmH20] 5 cmH20 Plateau Pressure:  [17 cmH20]  17 cmH20   Intake/Output Summary (Last 24 hours) at 09/19/2021 0734 Last data filed at 09/19/2021 0600 Gross per 24 hour  Intake 932.27 ml  Output 975 ml  Net -42.73 ml   Filed Weights   09/15/21 1527  Weight: 75.2 kg    Examination:  General:  Resting comfortably in bed HENT: NCAT OP clear mucus membranes very dry PULM:  B, normal effort CV: RRR, no mgr GI: BS+, soft, nontender MSK: normal bulk and tone Neuro: awake, alert, no distress, MAEW   Resolved Hospital Problem list   Acute hypoxemic respiratory failure > resolved Cheyne Stoke's breathing on 6/17,> resolved  Assessment & Plan:  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 Secondary stroke prevention, hypertension and glucose parameters per stroke service Continue statin PT/OT/SLP  Septic shock from aspiration pneumonia, organism unspecified> resolved Ceftriaxone 5 days Monitor hemodynamics  Chronic HFrEF, not on b-blocker or ACE at baseline Tele Continue low dose metoprolol  AKI > improved Monitor BMET and UOP Replace electrolytes as needed  Ileus> persists, improved gaseous distension of colon/small bowel Concern for aspiration Will give ice chips, sips and meds today Hold tube feeding Repeat KUB SLP evaluation  Atrial fibrillation Tele Heparin infusion  PE and DVT 01/2021 Heparin infusion  Hypothyroidism Synthroid continue  Best Practice (right click and "Reselect all SmartList Selections" daily)   Diet/type: tubefeeds  DVT prophylaxis: systemic heparin GI prophylaxis: PPI Lines: Central line and yes and it is still needed Foley:  N/A Code Status:  full code Last date of multidisciplinary  goals of care discussion [per primary]  Critical care time: n/a minutes    [place], MD [place] [facility] Pager: [phone] Cell: [phone] After 7:00 pm call Elink  [phone]

## 2021-09-20 DIAGNOSIS — L8990 Pressure ulcer of unspecified site, unspecified stage: Secondary | ICD-10-CM | POA: Insufficient documentation

## 2021-09-20 DIAGNOSIS — I639 Cerebral infarction, unspecified: Secondary | ICD-10-CM | POA: Diagnosis not present

## 2021-09-20 LAB — CBC
HCT: 33.2 % — ABNORMAL LOW (ref 39.0–52.0)
Hemoglobin: 11.3 g/dL — ABNORMAL LOW (ref 13.0–17.0)
MCH: 33 pg (ref 26.0–34.0)
MCHC: 34 g/dL (ref 30.0–36.0)
MCV: 97.1 fL (ref 80.0–100.0)
Platelets: 52 10*3/uL — ABNORMAL LOW (ref 150–400)
RBC: 3.42 MIL/uL — ABNORMAL LOW (ref 4.22–5.81)
RDW: 13.8 % (ref 11.5–15.5)
WBC: 7.5 10*3/uL (ref 4.0–10.5)
nRBC: 0 % (ref 0.0–0.2)

## 2021-09-20 LAB — GLUCOSE, CAPILLARY
Glucose-Capillary: 103 mg/dL — ABNORMAL HIGH (ref 70–99)
Glucose-Capillary: 124 mg/dL — ABNORMAL HIGH (ref 70–99)
Glucose-Capillary: 111 mg/dL — ABNORMAL HIGH (ref 70–99)
Glucose-Capillary: 109 mg/dL — ABNORMAL HIGH (ref 70–99)
Glucose-Capillary: 112 mg/dL — ABNORMAL HIGH (ref 70–99)
Glucose-Capillary: 85 mg/dL (ref 70–99)
Glucose-Capillary: 122 mg/dL — ABNORMAL HIGH (ref 70–99)

## 2021-09-20 LAB — HEPARIN LEVEL (UNFRACTIONATED)
Heparin Unfractionated: 0.11 IU/mL — ABNORMAL LOW (ref 0.30–0.70)
Heparin Unfractionated: 0.11 IU/mL — ABNORMAL LOW (ref 0.30–0.70)

## 2021-09-20 LAB — BASIC METABOLIC PANEL
Anion gap: 7 (ref 5–15)
BUN: 22 mg/dL (ref 8–23)
CO2: 27 mmol/L (ref 22–32)
Calcium: 7.7 mg/dL — ABNORMAL LOW (ref 8.9–10.3)
Chloride: 96 mmol/L — ABNORMAL LOW (ref 98–111)
Creatinine, Ser: 1.08 mg/dL (ref 0.61–1.24)
GFR, Estimated: >60 mL/min (ref >60)
Glucose, Bld: 348 mg/dL — ABNORMAL HIGH (ref 70–99)
Potassium: 3.3 mmol/L — ABNORMAL LOW (ref 3.5–5.1)
Sodium: 130 mmol/L — ABNORMAL LOW (ref 135–145)

## 2021-09-20 MED ORDER — APIXABAN 5 MG PO TABS
5.0000 mg | ORAL_TABLET | Freq: Two times a day (BID) | ORAL | Status: DC
  Administered 2021-09-20 – 2021-09-22 (×5): 5 mg via ORAL
  Filled 2021-09-20 (×5): qty 1

## 2021-09-20 MED ORDER — INSULIN ASPART 100 UNIT/ML IJ SOLN: 0.0000 [IU] | Freq: Every day | INTRAMUSCULAR | Status: DC

## 2021-09-20 MED ORDER — CARVEDILOL 3.125 MG PO TABS
3.1250 mg | ORAL_TABLET | Freq: Two times a day (BID) | ORAL | Status: DC
  Administered 2021-09-20 – 2021-09-22 (×5): 3.125 mg via ORAL
  Filled 2021-09-20 (×5): qty 1

## 2021-09-20 MED ORDER — ENSURE ENLIVE PO LIQD
237.0000 mL | Freq: Three times a day (TID) | ORAL | Status: DC
  Administered 2021-09-20 – 2021-09-22 (×6): 237 mL via ORAL

## 2021-09-20 MED ORDER — POLYETHYLENE GLYCOL 3350 17 G PO PACK
17.0000 g | PACK | Freq: Every day | ORAL | Status: DC
Start: 2021-09-21 — End: 2021-09-22
  Administered 2021-09-21 – 2021-09-22 (×2): 17 g via ORAL
  Filled 2021-09-20 (×2): qty 1

## 2021-09-20 MED ORDER — INSULIN ASPART 100 UNIT/ML IJ SOLN
0.0000 [IU] | Freq: Three times a day (TID) | INTRAMUSCULAR | Status: DC
  Administered 2021-09-21: 2 [IU] via SUBCUTANEOUS

## 2021-09-20 MED ORDER — ADULT MULTIVITAMIN W/MINERALS CH
1.0000 | ORAL_TABLET | Freq: Every day | ORAL | Status: DC
  Administered 2021-09-21 – 2021-09-22 (×2): 1 via ORAL
  Filled 2021-09-20 (×2): qty 1

## 2021-09-20 NOTE — Progress Notes (Signed)
Physical Therapy Treatment Patient Details Name: Eugene Campbell MRN: [id number] DOB: [birth year] Today's Date: 09/20/2021   History of Present Illness Pt is an 81 y/o male who presented with sudden onset of R sided weakness, facial droop and aphasia. 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 Pt remained intubated after procedure with complications due to acute hypoxic respiratory failure and massive aspiration. Pt extubated 6/17. PMH: a fib, HTN, PT and DVT, clotting disorder, DM2    PT Comments    Pt has shown good improvement toward goals.  Still needs repetitive cues for safety/direction whether due to [facility] or communication challenges and is limited by R side> L sided weakness and mild paretic gait.  Emphasis on safety and technique with transition to EOB, sit to stands and progression of gait stability,pattern and stamina.    Recommendations for follow up therapy are one component of a multi-disciplinary discharge planning process, led by the attending physician.  Recommendations may be updated based on patient status, additional functional criteria and insurance authorization.  Follow Up Recommendations  Acute inpatient rehab (3hours/day)     Assistance Recommended at Discharge Frequent or constant Supervision/Assistance  Patient can return home with the following A little help with walking and/or transfers;A little help with bathing/dressing/bathroom;Assistance with cooking/housework;Assist for transportation;Help with stairs or ramp for entrance   Equipment Recommendations   (TBA)    Recommendations for Other Services       Precautions / Restrictions       Mobility  Bed Mobility Overal bed mobility: Needs Assistance Bed Mobility: Supine to Sit     Supine to sit: Min assist     General bed mobility comments: min stability assist at trunk whil pt got R UE positioned to  assist boosting up to midline.    Transfers Overall transfer level: Needs assistance   Transfers: Sit to/from Stand Sit to Stand: Min assist           General transfer comment: multimodal cues for hand placement/general safety.  Assist forward and with boost with extra support once in stance while pt working to achieve terminal extension    Ambulation/Gait Ambulation/Gait assistance: Mod assist, +2 safety/equipment Gait Distance (Feet): 8 Feet (to a seat and then additional 20 feet into the hall and back to the recliner.) Assistive device: Rolling walker (2 wheels) Gait Pattern/deviations: Step-through pattern   Gait velocity interpretation: <1.31 ft/sec, indicative of household ambulator   General Gait Details: moderately unsteady and weak-kneed gait with short step length, paretic on the R, assist to help pt maintain posture, w/shift to unload and maneuver the RW.   Stairs             Wheelchair Mobility    Modified Rankin (Stroke Patients Only) Modified Rankin (Stroke Patients Only) Modified Rankin: Moderately severe disability     Balance Overall balance assessment: Needs assistance   Sitting balance-Leahy Scale: Fair Sitting balance - Comments: min guard for sitting balance at EOB Postural control: Right lateral lean Standing balance support: During functional activity, Bilateral upper extremity supported Standing balance-Leahy Scale: Poor Standing balance comment: external support and UE's on AD                            Cognition Arousal/Alertness: Awake/alert Behavior During Therapy: WFL for tasks assessed/performed, Impulsive Overall Cognitive Status:  (NT formally, HOH, but follows directions.  Appears a little impulsive, but likely from [facility])                                          Exercises      General Comments General comments (skin integrity, edema, etc.): [place] and step daughter present and supporting       Pertinent Vitals/Pain Pain Assessment Pain Assessment: Faces Faces Pain Scale: No hurt Pain Intervention(s): Monitored during session    Home Living                          Prior Function            PT Goals (current goals can now be found in the care plan section) Acute Rehab PT Goals Patient Stated Goal: pt unable due to intubated, wife wants pt to be active as usual. PT Goal Formulation: With patient/family Time For Goal Achievement: 10/01/21 Potential to Achieve Goals: Good Progress towards PT goals: Progressing toward goals    Frequency    Min 3X/week      PT Plan Current plan remains appropriate    Co-evaluation              AM-PAC PT "6 Clicks" Mobility   Outcome Measure  Help needed turning from your back to your side while in a flat bed without using bedrails?: A Little Help needed moving from lying on your back to sitting on the side of a flat bed without using bedrails?: A Little Help needed moving to and from a bed to a chair (including a wheelchair)?: A Lot Help needed standing up from a chair using your arms (e.g., wheelchair or bedside chair)?: A Little Help needed to walk in hospital room?: Total Help needed climbing 3-5 steps with a railing? : Total 6 Click Score: 13    End of Session   Activity Tolerance: Patient limited by fatigue;Patient tolerated treatment well Patient left: in chair;with call bell/phone within reach;with family/visitor present Nurse Communication: Mobility status PT Visit Diagnosis: Other abnormalities of gait and mobility (R26.89);Difficulty in walking, not elsewhere classified (R26.2);Other symptoms and signs involving the nervous system (R29.898)     Time: 3383-2919 PT Time Calculation (min) (ACUTE ONLY): 27 min  Charges:  $Gait Training: 8-22 mins $Therapeutic Activity: 8-22 mins                     09/20/2021  Jacinto Halim., PT Acute Rehabilitation Services [phone]  (pager) [phone]   (office)   Eliseo Gum Alyssabeth Bruster 09/20/2021, 6:07 PM

## 2021-09-20 NOTE — Progress Notes (Signed)
Inpatient Rehab Admissions Coordinator:  ? ?Per therapy recommendations,  patient was screened for CIR candidacy by Wetona Viramontes, MS, CCC-SLP. At this time, Pt. Appears to be a a potential candidate for CIR. I will place   order for rehab consult per protocol for full assessment. Please contact me any with questions. ? ?Lauraann Missey, MS, CCC-SLP ?Rehab Admissions Coordinator  ?[phone] (celll) ?[phone] (office) ? ?

## 2021-09-20 NOTE — Progress Notes (Signed)
RestartSTROKE TEAM PROGRESS NOTE   SUBJECTIVE (INTERVAL HISTORY) His RN, wife are at the bedside.  Pt continues to do well and has been breathing fine.  He passed swallow eval today and has been started on a diet.  He is awake and follows all simple commands, able to talk and interactive, able to name and repeat. Moving all extremities.  CCM is planning to sign off.  OBJECTIVE Temp:  [97.9 F (36.6 C)-98.7 F (37.1 C)] 98.4 F (36.9 C) (06/19 1100) Pulse Rate:  [45-83] 51 (06/19 0900) Cardiac Rhythm: Atrial fibrillation (06/19 0800) Resp:  [19-30] 25 (06/19 1200) BP: (116-141)/(58-75) 130/62 (06/19 1200) SpO2:  [92 %-98 %] 97 % (06/19 0900)  Recent Labs  Lab 09/19/21 1532 09/19/21 1943 09/19/21 2324 09/20/21 0354 09/20/21 0813  GLUCAP 104* 119* 107* 111* 109*   Recent Labs  Lab 09/15/21 1946 09/16/21 0409 09/16/21 1130 09/17/21 0344 09/17/21 0418 09/18/21 0345 09/19/21 0347 09/20/21 0508  NA  --  136   < > 132* 134* 132* 133* 130*  K  --  4.6   < > 5.1 5.1 5.3* 4.0 3.3*  CL  --  103  --   --  103 98 98 96*  CO2  --  23  --   --  _0 GLUCOSE  --  161*  --   --  144* 110* 93 348*  BUN  --  14  --   --  31* 40* 39* 22  CREATININE  --  1.39*  --   --  2.43* 2.12* 1.54* 1.08  CALCIUM  --  8.9  --   --  7.7* 7.8* 7.9* 7.7*  MG 1.9  --   --   --  2.4 2.4  --   --   PHOS 2.2*  --   --   --  7.2* 5.8*  --   --    < > = values in this interval not displayed.   Recent Labs  Lab 09/15/21 1525 09/17/21 0418 09/18/21 0345  AST 34 31 28  ALT 28 41 31  ALKPHOS 94 82 78  BILITOT 0.8 0.9 0.7  PROT 5.9* 5.3* 5.1*  ALBUMIN 3.2* 2.6* 2.2*   Recent Labs  Lab 09/15/21 1525 09/15/21 1532 09/16/21 0409 09/16/21 1130 09/17/21 0344 09/17/21 0418 09/18/21 0345 09/19/21 0347 09/20/21 0508  WBC 7.8  --  11.9*  --   --  7.0 10.9* 10.6* 7.5  NEUTROABS 4.2  --   --   --   --  4.8  --   --   --   HGB 14.5   < > 14.6   < > 15.6 15.8 13.9 12.5* 11.3*  HCT 43.2   < > 42.9    < > 46.0 46.3 40.5 37.3* 33.2*  MCV 98.6  --  95.5  --   --  97.3 96.7 97.4 97.1  PLT 109*  --  112*  --   --  104* 73* 65* 52*   < > = values in this interval not displayed.   No results for input(s): "CKTOTAL", "CKMB", "CKMBINDEX", "TROPONINI" in the last 168 hours. No results for input(s): "LABPROT", "INR" in the last 72 hours.  No results for input(s): "COLORURINE", "LABSPEC", "PHURINE", "GLUCOSEU", "HGBUR", "BILIRUBINUR", "KETONESUR", "PROTEINUR", "UROBILINOGEN", "NITRITE", "LEUKOCYTESUR" in the last 72 hours.  Invalid input(s): "APPERANCEUR"      Component Value Date/Time   CHOL 133 09/16/2021 0409   TRIG 66 09/19/2021 0347  HDL 59 09/16/2021 0409   CHOLHDL 2.3 09/16/2021 0409   VLDL 8 09/16/2021 0409   LDLCALC 66 09/16/2021 0409   Lab Results  Component Value Date   HGBA1C 5.9 (H) 09/15/2021   No results found for: "LABOPIA", "COCAINSCRNUR", "LABBENZ", "AMPHETMU", "THCU", "LABBARB"  Recent Labs  Lab 09/15/21 1946  ETH <10    I have personally reviewed the radiological images below and agree with the radiology interpretations.  IR PERCUTANEOUS ART THROMBECTOMY/INFUSION INTRACRANIAL INC DIAG ANGIO  Addendum Date: 09/20/2021   ADDENDUM REPORT: 09/20/2021 54:65 ADDENDUM: Complications: No complications seen. Electronically Signed   By: Frazier Richards M.D.   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 Patient's last known normal was at 1430 NIH stroke scale of 24, modified Rankin scale of 1. TNK was not given as the patient was on apixaban. EXAM: ULTRASOUND-GUIDED VASCULAR ACCESS DIAGNOSTIC CEREBRAL ANGIOGRAM MECHANICAL THROMBECTOMY FLAT PANEL HEAD CT COMPARISON:  None Available. MEDICATIONS: Ancef 2 g IV. The antibiotic was administered within 1 hour of the procedure Performing surgeon: Dr. Frazier Richards Assistant: Dr. Karenann Cai ANESTHESIA/SEDATION: General anesthesia CONTRAST:  Twenty-five mL of Omnipaque 300 milligram/mL FLUOROSCOPY: Radiation Exposure Index (as provided by the fluoroscopic device): 035 mGy Kerma COMPLICATIONS: SIR Level A - No therapy, no consequence. TECHNIQUE: Informed written consent was obtained from the patient's wife after a thorough discussion of the procedural risks, benefits and alternatives. All questions were addressed. Maximal Sterile Barrier Technique was utilized including caps, mask, sterile gowns, sterile gloves, sterile drape, hand hygiene and skin antiseptic. A timeout was performed prior to the initiation of the procedure. The right groin was prepped and draped in the usual sterile fashion. Using a micropuncture kit and the modified Seldinger technique, access was gained to the right common femoral artery and an 8 [place] by 25 cm sheath was placed. Real-time ultrasound guidance was utilized for vascular access including the acquisition of a permanent ultrasound image documenting patency of the accessed vessel. Under fluoroscopy, a Zoom 88 guide catheter was navigated over a 5 [place] VTK catheter and a 0.035" Terumo Glidewire into the aortic arch. The catheter was placed into the left common carotid artery and then advanced into the left internal carotid artery under roadmap guidance. The diagnostic catheter was removed. Frontal and lateral angiograms of the head were obtained. FINDINGS: 1. Normal caliber of the right common femoral artery, adequate for vascular access. 2. Occlusion of a proximal left M1/MCA. 3. Delayed collaterals from the left ACA to the left MCA vascular tree. PROCEDURE: Using biplane roadmap, a zoom 071 aspiration catheter was navigated over an Aristotle 24 microguidewire into the cavernous segment of the left ICA. The aspiration catheter was then advanced to the level of occlusion at the left M1 MCA and connected to an aspiration pump.  Continuous aspiration was performed for 2 minutes. The guide catheter was connected to a VacLok syringe. The aspiration catheter was subsequently removed under constant aspiration. The guide catheter was aspirated for debris. Then, biplane DSA cerebral angiogram was performed by injecting contrast through the guide catheter which demonstrated complete recanalization of the proximal left MCA and its branches (TICI 3). The MCA and its branches have a normal appearance. No aneurysm, av malformation, dural fistula or vasculitis seen. The venous phase of the cerebral angiogram has a normal appearance with a preferential venous  return into the right transverse, sigmoid sinuses and the into the internal jugular vein. Mild narrowing of the left transverse sinus. Flat panel CT of the head was obtained and post processed in a separate workstation with concurrent attending physician supervision. Selected images were sent to PACS. Flat panel CT of the head has a normal appearance without evidence of a bleed. Right common femoral artery angiogram was obtained in right anterior oblique view. The puncture is at the level of the common femoral artery. The artery has normal caliber, adequate for closure device. The sheath was exchanged over the wire for a Perclose prostyle which was utilized for access closure. Immediate hemostasis was achieved. IMPRESSION: 1. Successful mechanical thrombectomy for treatment of an occlusion of the proximal left M1/MCA. Complete recanalization (TICI 3) of the left MCA was attained. 2. Flat panel CT head was unremarkable. PLAN: 1. Transfer to ICU. 2. Blood pressure goals of 120-140 mm Hg. Electronically Signed: By: Frazier Richards M.D. On: 09/16/2021 10:22   IR US Guide Vasc Access Right  Addendum Date: 09/20/2021   ADDENDUM REPORT: 09/20/2021 34:19 ADDENDUM: Complications: No complications seen. Electronically Signed   By: Frazier Richards M.D.   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 Patient's last known normal was at 1430 NIH stroke scale of 24, modified Rankin scale of 1. TNK was not given as the patient was on apixaban. EXAM: ULTRASOUND-GUIDED VASCULAR ACCESS DIAGNOSTIC CEREBRAL ANGIOGRAM MECHANICAL THROMBECTOMY FLAT PANEL HEAD CT COMPARISON:  None Available. MEDICATIONS: Ancef 2 g IV. The antibiotic was administered within 1 hour of the procedure Performing surgeon: Dr. Frazier Richards Assistant: Dr. Karenann Cai ANESTHESIA/SEDATION: General anesthesia CONTRAST:  Twenty-five mL of Omnipaque 300 milligram/mL FLUOROSCOPY: Radiation Exposure Index (as provided by the fluoroscopic device): 379 mGy Kerma COMPLICATIONS: SIR Level A - No therapy, no consequence. TECHNIQUE: Informed written consent was obtained from the patient's wife after a thorough discussion of the procedural risks, benefits and alternatives. All questions were addressed. Maximal Sterile Barrier Technique was utilized including caps, mask, sterile gowns, sterile gloves, sterile drape, hand hygiene and skin antiseptic. A timeout was performed prior to the initiation of the procedure. The right groin was prepped and draped in the usual sterile fashion. Using a micropuncture kit and the modified Seldinger technique, access was gained to the right common femoral artery and an 8 [place] by 25 cm sheath was placed. Real-time ultrasound guidance was utilized for vascular access including the acquisition of a permanent ultrasound image documenting patency of the accessed vessel. Under fluoroscopy, a Zoom 88 guide catheter was navigated over a 5 [place] VTK catheter and a 0.035" Terumo Glidewire into the aortic arch. The catheter was placed into the left common carotid artery and then advanced into the left internal carotid artery under roadmap  guidance. The diagnostic catheter was removed. Frontal and lateral angiograms of the head were obtained. FINDINGS: 1. Normal caliber of the right common femoral artery, adequate for vascular access. 2. Occlusion of a proximal left M1/MCA. 3. Delayed collaterals from the left ACA to the left MCA vascular tree. PROCEDURE: Using biplane roadmap, a zoom 071 aspiration catheter was navigated over an Aristotle 24 microguidewire into the cavernous segment of the left ICA. The aspiration catheter was then advanced to the level of occlusion at the left M1 MCA and connected to an aspiration  pump. Continuous aspiration was performed for 2 minutes. The guide catheter was connected to a VacLok syringe. The aspiration catheter was subsequently removed under constant aspiration. The guide catheter was aspirated for debris. Then, biplane DSA cerebral angiogram was performed by injecting contrast through the guide catheter which demonstrated complete recanalization of the proximal left MCA and its branches (TICI 3). The MCA and its branches have a normal appearance. No aneurysm, av malformation, dural fistula or vasculitis seen. The venous phase of the cerebral angiogram has a normal appearance with a preferential venous return into the right transverse, sigmoid sinuses and the into the internal jugular vein. Mild narrowing of the left transverse sinus. Flat panel CT of the head was obtained and post processed in a separate workstation with concurrent attending physician supervision. Selected images were sent to PACS. Flat panel CT of the head has a normal appearance without evidence of a bleed. Right common femoral artery angiogram was obtained in right anterior oblique view. The puncture is at the level of the common femoral artery. The artery has normal caliber, adequate for closure device. The sheath was exchanged over the wire for a Perclose prostyle which was utilized for access closure. Immediate hemostasis was achieved.  IMPRESSION: 1. Successful mechanical thrombectomy for treatment of an occlusion of the proximal left M1/MCA. Complete recanalization (TICI 3) of the left MCA was attained. 2. Flat panel CT head was unremarkable. PLAN: 1. Transfer to ICU. 2. Blood pressure goals of 120-140 mm Hg. Electronically Signed: By: Frazier Richards M.D. On: 09/16/2021 10:22   IR CT Head Ltd  Addendum Date: 09/20/2021   ADDENDUM REPORT: 09/20/2021 99:83 ADDENDUM: Complications: No complications seen. Electronically Signed   By: Frazier Richards M.D.   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 Patient's last known normal was at 1430 NIH stroke scale of 24, modified Rankin scale of 1. TNK was not given as the patient was on apixaban. EXAM: ULTRASOUND-GUIDED VASCULAR ACCESS DIAGNOSTIC CEREBRAL ANGIOGRAM MECHANICAL THROMBECTOMY FLAT PANEL HEAD CT COMPARISON:  None Available. MEDICATIONS: Ancef 2 g IV. The antibiotic was administered within 1 hour of the procedure Performing surgeon: Dr. Frazier Richards Assistant: Dr. Karenann Cai ANESTHESIA/SEDATION: General anesthesia CONTRAST:  Twenty-five mL of Omnipaque 300 milligram/mL FLUOROSCOPY: Radiation Exposure Index (as provided by the fluoroscopic device): 382 mGy Kerma COMPLICATIONS: SIR Level A - No therapy, no consequence. TECHNIQUE: Informed written consent was obtained from the patient's wife after a thorough discussion of the procedural risks, benefits and alternatives. All questions were addressed. Maximal Sterile Barrier Technique was utilized including caps, mask, sterile gowns, sterile gloves, sterile drape, hand hygiene and skin antiseptic. A timeout was performed prior to the initiation of the procedure. The right groin was prepped and draped in the usual sterile fashion. Using a micropuncture kit and the  modified Seldinger technique, access was gained to the right common femoral artery and an 8 [place] by 25 cm sheath was placed. Real-time ultrasound guidance was utilized for vascular access including the acquisition of a permanent ultrasound image documenting patency of the accessed vessel. Under fluoroscopy, a Zoom 88 guide catheter was navigated over a 5 [place] VTK catheter and a 0.035" Terumo Glidewire into the aortic arch. The catheter was placed into the left common carotid artery and then advanced into the left internal carotid artery under roadmap guidance. The  diagnostic catheter was removed. Frontal and lateral angiograms of the head were obtained. FINDINGS: 1. Normal caliber of the right common femoral artery, adequate for vascular access. 2. Occlusion of a proximal left M1/MCA. 3. Delayed collaterals from the left ACA to the left MCA vascular tree. PROCEDURE: Using biplane roadmap, a zoom 071 aspiration catheter was navigated over an Aristotle 24 microguidewire into the cavernous segment of the left ICA. The aspiration catheter was then advanced to the level of occlusion at the left M1 MCA and connected to an aspiration pump. Continuous aspiration was performed for 2 minutes. The guide catheter was connected to a VacLok syringe. The aspiration catheter was subsequently removed under constant aspiration. The guide catheter was aspirated for debris. Then, biplane DSA cerebral angiogram was performed by injecting contrast through the guide catheter which demonstrated complete recanalization of the proximal left MCA and its branches (TICI 3). The MCA and its branches have a normal appearance. No aneurysm, av malformation, dural fistula or vasculitis seen. The venous phase of the cerebral angiogram has a normal appearance with a preferential venous return into the right transverse, sigmoid sinuses and the into the internal jugular vein. Mild narrowing of the left transverse sinus. Flat panel CT of the head was  obtained and post processed in a separate workstation with concurrent attending physician supervision. Selected images were sent to PACS. Flat panel CT of the head has a normal appearance without evidence of a bleed. Right common femoral artery angiogram was obtained in right anterior oblique view. The puncture is at the level of the common femoral artery. The artery has normal caliber, adequate for closure device. The sheath was exchanged over the wire for a Perclose prostyle which was utilized for access closure. Immediate hemostasis was achieved. IMPRESSION: 1. Successful mechanical thrombectomy for treatment of an occlusion of the proximal left M1/MCA. Complete recanalization (TICI 3) of the left MCA was attained. 2. Flat panel CT head was unremarkable. PLAN: 1. Transfer to ICU. 2. Blood pressure goals of 120-140 mm Hg. Electronically Signed: By: Frazier Richards M.D. On: 09/16/2021 10:22   DG Abd Portable 1V  Result Date: 09/19/2021 CLINICAL DATA:  Ileus. EXAM: PORTABLE ABDOMEN - 1 VIEW COMPARISON:  One-view abdomen 09/18/2021 FINDINGS: Previously noted dilated loops of small bowel have normalized. No obstruction or free air is present. Degenerative changes are present in the lower lumbar spine. IMPRESSION: 1. Interval resolution of dilated loops of small bowel. 2. No acute abnormality. Electronically Signed   By: San Morelle M.D.   On: 09/19/2021 14:54   DG Abd Portable 1V  Result Date: 09/18/2021 CLINICAL DATA:  Abdominal distention. EXAM: PORTABLE ABDOMEN - 1 VIEW COMPARISON:  09/16/2021. FINDINGS: Flat plate study in 2 films at 4:54 a.m., 09/18/2021. There is increased opacity in the left lung base consistent with consolidation, atelectasis or aspiration. Enteric catheter has been advanced with tip now superimposing over the left iliac crest. If the patient has had gastric bypass with gastrojejunostomy the tube is probably in a jejunal segment. Postsurgical changes are again noted in the left  and right upper quadrant. There is improved gaseous distention of central abdominal large bowel segments. Currently no dilated small bowel is seen. Retained fecal burden appears somewhat improved particularly in the ascending and descending colon. Colonic gas remains present through into the rectum. Visceral shadows are stable. There is no supine evidence of free air. In all other respects there are no further changes. IMPRESSION: 1. Increasingly dense consolidation in the left lower lobe  consistent with atelectasis, pneumonia or aspiration. 2. Improved colonic distention, currently with no small bowel dilatation being seen. 3. Interval advancement of the enteric tube with the tip superimposing over the left iliac crest. If there has been a gastric bypass, the tip of the tube is probably in a jejunal segment past the gastrojejunostomy. Electronically Signed   By: Telford Nab M.D.   On: 09/18/2021 07:07   DG Chest Port 1 View  Result Date: 09/17/2021 CLINICAL DATA:  Hypoxia. 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 Endotracheal tube terminates at the level of the clavicles, well above the carina. Right PICC terminates over the SVC, unchanged. Enteric tube courses into the abdomen with tip not imaged. The cardiac silhouette remains mildly enlarged. Left perihilar and left lower lobe opacities are unchanged. No sizable pleural effusion or pneumothorax is identified. IMPRESSION: Unchanged left perihilar and lower lobe airspace disease concerning for pneumonia. Electronically Signed   By: Logan Bores M.D.   On: 09/17/2021 08:54   ECHOCARDIOGRAM COMPLETE  Result Date: 09/16/2021    ECHOCARDIOGRAM REPORT   Patient Name:   [facility] Date of Exam: 09/16/2021 Medical Rec #:  [id number]      Height:       73.0 in Accession #:    [id number]     Weight:       165.8 lb Date of Birth:  1940-10-03      BSA:          1.987  m Patient Age:    89 years       BP:           102/60 mmHg Patient Gender: M              HR:           63 bpm. Exam Location:  Inpatient Procedure: 2D Echo, Cardiac Doppler and Color Doppler Indications:    Stroke  History:        Patient has no prior history of Echocardiogram examinations.                 Aortic Valve Disease, Arrythmias:Atrial Fibrillation; Risk                 Factors:Former Smoker and Diabetes. AVR. Acute respiratory                 failure with hypoxia. HFrEF.                 Aortic Valve: valve is present in the aortic position. Procedure                 Date: unknown.  Sonographer:    Clayton Lefort RDCS (AE) Referring Phys: [id number] [place]  Sonographer Comments: Echo performed with patient supine and on artificial respirator. Unable to use Definity due to lack of line access per nurse. IMPRESSIONS  1. Left ventricular ejection fraction, by estimation, is 20 to 25%. The left ventricle has severely decreased function. The left ventricle demonstrates global hypokinesis. Left ventricular diastolic parameters are consistent with Grade I diastolic dysfunction (impaired relaxation).  2. Right ventricular systolic function is mildly reduced. The right ventricular size is mildly enlarged.  3. Left atrial size was mildly dilated.  4. Right atrial size was severely dilated.  5. The mitral valve is normal in structure. No evidence of mitral valve regurgitation. No evidence of mitral stenosis.  6. 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 The aortic valve has been repaired/replaced. Aortic valve regurgitation is not visualized. No aortic stenosis is present. There is a valve present in the aortic position. Procedure Date: unknown. Aortic valve mean gradient measures 8.0 mmHg. Aortic valve Vmax measures 2.00 m/s.  7. The inferior vena cava is normal in size with greater than 50% respiratory variability, suggesting right atrial pressure of 3 mmHg. FINDINGS  Left  Ventricle: Left ventricular ejection fraction, by estimation, is 20 to 25%. The left ventricle has severely decreased function. The left ventricle demonstrates global hypokinesis. The left ventricular internal cavity size was normal in size. There is no left ventricular hypertrophy. Left ventricular diastolic parameters are consistent with Grade I diastolic dysfunction (impaired relaxation). Right Ventricle: The right ventricular size is mildly enlarged. No increase in right ventricular wall thickness. Right ventricular systolic function is mildly reduced. Left Atrium: Left atrial size was mildly dilated. Right Atrium: Right atrial size was severely dilated. Pericardium: There is no evidence of pericardial effusion. Mitral Valve: The mitral valve is normal in structure. Mild to moderate mitral annular calcification. No evidence of mitral valve regurgitation. No evidence of mitral valve stenosis. Tricuspid Valve: The tricuspid valve is normal in structure. Tricuspid valve regurgitation is not demonstrated. No evidence of tricuspid stenosis. 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 The aortic valve has been repaired/replaced. Aortic valve regurgitation is not visualized. No aortic stenosis is present. Aortic valve mean gradient measures 8.0 mmHg. Aortic valve peak gradient measures 16.0 mmHg. Aortic valve area, by VTI measures 1.10 cm. There is a valve present in the aortic position. Procedure Date: unknown. Pulmonic Valve: The pulmonic valve was not well visualized. Pulmonic valve regurgitation is trivial. No evidence of pulmonic stenosis. Aorta: The aortic root is normal in size and structure. Venous: The inferior vena cava is normal in size with greater than 50% respiratory variability, suggesting right atrial pressure of 3 mmHg. IAS/Shunts: No atrial level shunt detected by color flow Doppler.  LEFT VENTRICLE PLAX 2D LVIDd:         5.30 cm      Diastology LVIDs:          4.90 cm      LV e' medial:    4.13 cm/s LV PW:         1.20 cm      LV E/e' medial:  19.8 LV IVS:        2.00 cm      LV e' lateral:   9.14 cm/s LVOT diam:     2.00 cm      LV E/e' lateral: 8.9 LV SV:         38 LV SV Index:   19 LVOT Area:     3.14 cm  LV Volumes (MOD) LV vol d, MOD A2C: 158.0 ml LV vol d, MOD A4C: 170.0 ml LV vol s, MOD A2C: 94.3 ml LV vol s, MOD A4C: 139.0 ml LV SV MOD A2C:     63.7 ml LV SV MOD A4C:     170.0 ml LV SV MOD BP:      53.6 ml RIGHT VENTRICLE RV Basal diam:  3.20 cm RV S prime:     6.74 cm/s TAPSE (M-mode): 0.8 cm LEFT ATRIUM             Index        RIGHT ATRIUM  Index LA diam:        3.90 cm 1.96 cm/m   RA Area:     25.90 cm LA Vol (A2C):   66.4 ml 33.42 ml/m  RA Volume:   85.90 ml  43.24 ml/m LA Vol (A4C):   56.6 ml 28.49 ml/m LA Biplane Vol: 66.5 ml 33.47 ml/m  AORTIC VALVE AV Area (Vmax):    1.20 cm AV Area (Vmean):   1.25 cm AV Area (VTI):     1.10 cm AV Vmax:           200.00 cm/s AV Vmean:          131.000 cm/s AV VTI:            0.349 m AV Peak Grad:      16.0 mmHg AV Mean Grad:      8.0 mmHg LVOT Vmax:         76.40 cm/s LVOT Vmean:        52.300 cm/s LVOT VTI:          0.122 m LVOT/AV VTI ratio: 0.35  AORTA Ao Root diam: 2.70 cm Ao Asc diam:  4.00 cm MITRAL VALVE MV Area (PHT): 4.06 cm    SHUNTS MV Decel Time: 187 msec    Systemic VTI:  0.12 m MV E velocity: 81.80 cm/s  Systemic Diam: 2.00 cm Kardie Tobb DO Electronically signed by Berniece Salines DO Signature Date/Time: 09/16/2021/5:54:30 PM    Final    DG CHEST PORT 1 VIEW  Result Date: 09/16/2021 CLINICAL DATA:  PICC line placement. EXAM: PORTABLE CHEST 1 VIEW COMPARISON:  Chest x-ray 09/16/2021 FINDINGS: Endotracheal tube tip is 5.2 cm above the carina. Enteric tube extends below the diaphragm. There is a new right upper extremity PICC terminating in the SVC. Patient is status post cardiac surgery and valve replacement, unchanged. Cardiac silhouette is enlarged. There is some increasing  retrocardiac opacities. No pleural effusion or pneumothorax identified. Osseous structures are stable. IMPRESSION: 1. Right upper extremity PICC terminates in the SVC. 2. Increasing retrocardiac atelectasis/airspace disease. 3. Stable cardiomegaly. Electronically Signed   By: Ronney Asters M.D.   On: 09/16/2021 15:30   [place] EKG SITE RITE  Result Date: 09/16/2021 If Site Rite image not attached, placement could not be confirmed due to current cardiac rhythm.  DG Abd Portable 1V  Addendum Date: 09/16/2021   ADDENDUM REPORT: 09/16/2021 08:27 ADDENDUM: Study discussed by telephone with Nurse Apolonio Schneiders on 09/16/2021 at 0823 hours. Electronically Signed   By: Genevie Ann M.D.   On: 09/16/2021 08:27   Result Date: 09/16/2021 CLINICAL DATA:  81 year old male with projectile vomiting. EXAM: PORTABLE ABDOMEN - 1 VIEW COMPARISON:  09/15/2021. FINDINGS: Portable AP supine view at 0756 hours. Enteric tube appears pulled back slightly, side hole now at the expected level of the GEJ. Stable epigastric and right upper quadrant surgical clips. Negative visible lung bases. Increasing gas-filled bowel loops throughout the abdomen and pelvis, superimposed on retained stool in the colon. Possible dilated small bowel now. But gas is present to the rectum. No definite pneumoperitoneum on these supine views. No acute osseous abnormality identified. IMPRESSION: 1. Enteric tube has been pulled back, side hole now at the level of the GEJ. Recommend advancing 6 cm to ensure side hole placement inside the stomach. 2. Increasing gas-filled bowel throughout the abdomen and pelvis, most suggestive of Acute Ileus. Electronically Signed: By: Genevie Ann M.D. On: 09/16/2021 08:18   DG CHEST PORT 1 VIEW  Result Date:  09/16/2021 CLINICAL DATA:  81 year old male with history of chest congestion. EXAM: PORTABLE CHEST 1 VIEW COMPARISON:  Chest x-ray 09/16/2021. FINDINGS: An endotracheal tube is in place with tip 7.4 cm above the carina. A nasogastric tube  is seen extending into the stomach, however, the tip of the nasogastric tube extends below the lower margin of the image. Lung volumes are normal. No consolidative airspace disease. No pleural effusions. No pneumothorax. No pulmonary nodule or mass noted. Pulmonary vasculature and the cardiomediastinal silhouette are within normal limits. 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 IMPRESSION: 1. Support apparatus, as above. 2. No radiographic evidence of acute cardiopulmonary disease. 3. Aortic atherosclerosis. Electronically Signed   By: Vinnie Langton M.D.   On: 09/16/2021 08:12   DG Chest Port 1 View  Result Date: 09/16/2021 CLINICAL DATA:  81 year old male with history of stroke. EXAM: PORTABLE CHEST 1 VIEW COMPARISON:  Chest x-ray 09/15/2021. FINDINGS: An endotracheal tube is in place with tip 7.0 cm above the carina. A nasogastric tube is seen extending into the stomach, however, the tip of the nasogastric tube extends below the lower margin of the image. Lung volumes are normal. No consolidative airspace disease. No pleural effusions. No pneumothorax. No evidence of pulmonary edema. Heart size is mildly enlarged. Upper mediastinal contours are within normal limits. Atherosclerotic calcifications are noted in the thoracic aorta. Status post median sternotomy for aortic valve replacement (a stented bioprosthesis is noted). IMPRESSION: 1. Postoperative changes and support apparatus, as above. 2. No radiographic evidence of acute cardiopulmonary disease. 3. Mild cardiomegaly. 4. Aortic atherosclerosis. Electronically Signed   By: Vinnie Langton M.D.   On: 09/16/2021 05:50   MR ANGIO HEAD WO CONTRAST  Result Date: 09/16/2021 CLINICAL DATA:  81 year old male with paroxysmal atrial fibrillation on Eliquis. New onset aphasia, right side weakness. Code stroke presentation yesterday. Left M1 occlusion by CTA. EXAM: MRA HEAD  WITHOUT CONTRAST TECHNIQUE: Angiographic images of the Circle of Willis were acquired using MRA technique without intravenous contrast. COMPARISON:  MRI brain today reported separately. CTA head and neck yesterday. FINDINGS: Anterior circulation: Antegrade flow in both ICA siphons with bilateral siphon irregularity in keeping with the atherosclerosis and stenosis better demonstrated by CTA. Carotid termini remain patent. And now the left MCA M1 segment, anterior temporal artery origin, and left MCA bifurcation are patent. Left MCA branches appear not significantly changed from yesterday, including a long segment M2 stenosis demonstrated in the posterior division on series 1035, image 4. Right MCA M1 segment is patent but with moderate to severe stenosis upstream of the right MCA bifurcation, concordant with CTA appearance yesterday. Likewise, right MCA branches appear stable with mild irregularity. Anterior communicating artery and visible ACA branches are stable, mild right A2 stenosis. Posterior circulation: Antegrade flow in the dominant distal left vertebral artery with patent left PICA origin. No distal left vertebral stenosis. Asymmetrically decreased flow in the smaller distal right vertebral artery, which did appear atherosclerotic and stenotic on the CTA yesterday but was patent at that time to the vertebrobasilar junction. Right PICA origin appears to remain patent. Patent basilar artery without stenosis. Patent SCA and PCA origins. Bilateral PCA branches appear stable, within normal limits. Anatomic variants: Dominant left vertebral V4 segment. Other: Brain MRI reported separately today. IMPRESSION: 1. Recanalized Left MCA M1 segment. Patent left MCA bifurcation. Upstream ICA atherosclerosis and stenosis, and downstream MCA M2 branch stenosis appears unchanged from the CTA yesterday. 2. Elsewhere stable intracranial  atherosclerosis and stenosis, most notably both ICA siphons, Right MCA bifurcation, distal  Right Vertebral Artery. Electronically Signed   By: Genevie Ann M.D.   On: 09/16/2021 05:49   MR BRAIN WO CONTRAST  Result Date: 09/16/2021 CLINICAL DATA:  81 year old male with paroxysmal atrial fibrillation on Eliquis. New onset aphasia, right side weakness. Code stroke presentation yesterday. Left M1 occlusion. EXAM: MRI HEAD WITHOUT CONTRAST TECHNIQUE: Multiplanar, multiecho pulse sequences of the brain and surrounding structures were obtained without intravenous contrast. COMPARISON:  CT head and CTA head and neck yesterday. FINDINGS: Brain: Patchy restricted diffusion throughout the left basal ganglia including the caudate and the lentiform. There is some involvement of the posterior limb of the left external capsule. And there is some mesial temporal lobe involvement on series 5, image 73. But otherwise Insula and other left MCA territory cortex seems spared. No contralateral right hemisphere or posterior fossa restricted diffusion. Multiple chronic generally small cerebellar infarcts on the right. Patchy and confluent bilateral cerebral white matter T2 and FLAIR hyperintensity with a small area of chronic left middle frontal gyrus encephalomalacia (series 10, image 19). There are several chronic microhemorrhages also in the left frontal lobe (series 22, image 35). No midline shift, mass effect, evidence of mass lesion, ventriculomegaly, extra-axial collection or acute intracranial hemorrhage. Cervicomedullary junction and pituitary are within normal limits. Vascular: Major intracranial vascular flow voids are preserved except for the distal right vertebral artery, which was non dominant but patent by CTA yesterday. Skull and upper cervical spine: Negative. Visualized bone marrow signal is within normal limits. Sinuses/Orbits: Postoperative changes to both globes. Paranasal Visualized paranasal sinuses and mastoids are stable and well aerated. Other: Intubated. Small volume fluid in the pharynx and nasal  cavity. Grossly normal visible internal auditory structures. IMPRESSION: 1. Patchy acute infarct in the left basal ganglia and the left mesial temporal lobe with no associated hemorrhage or mass effect. 2. Underlying chronic small and medium-sized vessel ischemia. 3. MRA Head reported separately today. Electronically Signed   By: Genevie Ann M.D.   On: 09/16/2021 05:23   DG Abd Portable 1V  Result Date: 09/15/2021 CLINICAL DATA:  OG tube placement. EXAM: PORTABLE ABDOMEN - 1 VIEW COMPARISON:  None Available. FINDINGS: Tip and side port of the enteric tube below the diaphragm in the stomach. Enteric chain sutures in the left upper quadrant as well as multiple surgical clips. Surgical clips in the right upper quadrant. No bowel dilatation or evidence of obstruction in the included upper abdomen. IMPRESSION: Tip and side port of the enteric tube below the diaphragm in the stomach. Electronically Signed   By: Keith Rake M.D.   On: 09/15/2021 21:23   DG CHEST PORT 1 VIEW  Result Date: 09/15/2021 CLINICAL DATA:  Intubation. EXAM: PORTABLE CHEST 1 VIEW COMPARISON:  10/02/2020 FINDINGS: Endotracheal tube tip 5.4 cm from the carina at the level of the clavicular heads. Enteric tube in place with tip below the diaphragm not included on this chest field of view. Prior median sternotomy prosthetic aortic valve. Cardiomegaly. Hazy opacity at the left greater than right lung base likely represents combination of atelectasis and pleural effusions. Vascular congestion. No pneumothorax. IMPRESSION: 1. Endotracheal tube tip 5.4 cm from the carina at the level of the clavicular heads. Enteric tube in place with tip below the diaphragm. 2. Cardiomegaly. Vascular congestion. Hazy bibasilar lung opacities likely combination of pleural effusions and atelectasis. Suspect CHF. Electronically Signed   By: Keith Rake M.D.   On: 09/15/2021 21:21  CT ANGIO HEAD NECK W WO CM (CODE STROKE)  Result Date: 09/15/2021 CLINICAL  DATA:  Provided history: Neuro deficit, acute, stroke suspected; right-sided weakness and facial droop. EXAM: CT ANGIOGRAPHY HEAD AND NECK TECHNIQUE: Multidetector CT imaging of the head and neck was performed using the standard protocol during bolus administration of intravenous contrast. Multiplanar CT image reconstructions and MIPs were obtained to evaluate the vascular anatomy. Carotid stenosis measurements (when applicable) are obtained utilizing NASCET criteria, using the distal internal carotid diameter as the denominator. RADIATION DOSE REDUCTION: This exam was performed according to the departmental dose-optimization program which includes automated exposure control, adjustment of the mA and/or kV according to patient size and/or use of iterative reconstruction technique. CONTRAST:  14mL OMNIPAQUE IOHEXOL 350 MG/ML SOLN COMPARISON:  Noncontrast head CT performed immediately prior 09/15/2021. FINDINGS: CTA NECK FINDINGS Aortic arch: Standard aortic branching. Atherosclerotic plaque within the visualized aortic arch and proximal major branch vessels of the neck. No hemodynamically significant innominate or proximal subclavian artery stenosis. Right carotid system: CCA and ICA patent within the neck. Mild scattered atherosclerotic plaque within the proximal to mid CCA. Prominent predominantly calcified plaque about the carotid bifurcation and within the proximal ICA. Resultant in 50-60% stenosis at the origin of the right ICA. Left carotid system: CCA and ICA patent within the neck. Mild atherosclerotic plaque scattered within the proximal to mid CCA. Prominent atherosclerotic plaque about the carotid bifurcation and within the proximal ICA. Resultant 40% stenosis of the proximal ICA. Vertebral arteries: Vertebral arteries patent within the neck. The left vertebral artery is dominant. Severe atherosclerotic stenosis at the origin of the right vertebral artery. Mild atherosclerotic narrowing at the origin of  the left vertebral artery. Skeleton: Cervical spondylosis. No acute fracture or aggressive osseous lesion. Other neck: No neck mass or cervical lymphadenopathy. Upper chest: Prior median sternotomy. Smooth interlobular septal thickening within the imaged lung apices, likely reflecting interstitial edema. Partially imaged right pleural effusion. Review of the MIP images confirms the above findings CTA HEAD FINDINGS Anterior circulation: The intracranial internal carotid arteries are patent. Atherosclerotic plaque within both vessels. Most notably, there is moderate atherosclerotic narrowing of the distal cavernous/paraclinoid right ICA, and severe stenosis of the distal cavernous/paraclinoid left ICA. The left M1 MCA segment is occluded at its origin. There is some reconstitution of enhancement within M2 and more distal left MCA vessels. The right M1 segment is patent. Atherosclerotic irregularity of the M2 and more distal right MCA vessels. No appreciable right M2 proximal branch occlusion or high-grade proximal stenosis. The anterior cerebral arteries are patent. Posterior circulation: The intracranial vertebral arteries are patent. Nonstenotic atherosclerotic plaque within the non dominant V4 right vertebral artery. The basilar artery is patent. The posterior cerebral arteries are patent. Atherosclerotic irregularity of both vessels without high-grade proximal stenosis. Posterior communicating arteries are diminutive or absent, bilaterally. Venous sinuses: Within the limitations of contrast timing, no convincing thrombus. Anatomic variants: As described. Review of the MIP images confirms the above findings CTA head impression #1 called by telephone at the time of interpretation on 09/15/2021 at 3:50 pm to provider Isla Pence, who verbally acknowledged these results. IMPRESSION: CTA neck: 1. The common carotid, internal carotid and vertebral arteries are patent within the neck. 2. Atherosclerotic narrowing at  the origin of the right ICA of 50-60%. 3. Atherosclerotic narrowing at the origin of the left ICA of 40%. 4. Severe stenosis at the origin of the non-dominant right vertebral artery. 5. Mild atherosclerotic narrowing at the origin of the dominant  left vertebral artery. 6.  Aortic Atherosclerosis (ICD10-I70.0). 7. Interstitial edema within the imaged lung apices. 8. Partially imaged right pleural effusion. CTA head: 1. The M1 segment of the left middle cerebral artery is occluded at its origin. There is some reconstitution of enhancement within M2 and more distal left MCA vessels, likely due to collateral flow. 2. 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 3. Severe stenosis of the distal cavernous/paraclinoid left ICA. 4. Moderate stenosis of the distal cavernous/paraclinoid right ICA. Electronically Signed   By: Kellie Simmering D.O.   On: 09/15/2021 16:20   CT HEAD CODE STROKE WO CONTRAST  Result Date: 09/15/2021 CLINICAL DATA:  Code stroke. Neuro deficit, acute, stroke suspected. Right-sided weakness and facial droop. EXAM: CT HEAD WITHOUT CONTRAST TECHNIQUE: Contiguous axial images were obtained from the base of the skull through the vertex without intravenous contrast. RADIATION DOSE REDUCTION: This exam was performed according to the departmental dose-optimization program which includes automated exposure control, adjustment of the mA and/or kV according to patient size and/or use of iterative reconstruction technique. COMPARISON:  No pertinent prior exams available for comparison. FINDINGS: Brain: Mild generalized cerebral atrophy. Suspected subtle loss of gray-white differentiation within the left insula (for instance as seen on series 2, image 18). Small left MCA territory cortical/subcortical infarct within the left parietal lobe (involving the left postcentral gyrus). This is favored chronic. Background mild patchy and ill-defined hypoattenuation  within the cerebral white matter, nonspecific but compatible chronic small vessel ischemic disease. This includes a chronic lacunar infarct within the right centrum semiovale/corona radiata. 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 There is no acute intracranial hemorrhage. No extra-axial fluid collection. No evidence of an intracranial mass. No midline shift. Vascular: Hyperdense M1 left MCA segment, highly suspicious for presence of endoluminal thrombus. Skull: No fracture or aggressive osseous lesion. Sinuses/Orbits: No mass or acute finding within the imaged orbits. Small mucous retention cyst versus polyp within the left maxillary sinus. Minimal mucosal thickening within the right sphenoid sinus and scattered within the bilateral ethmoid sinuses. ASPECTS ([place] Stroke Program Early CT Score) - Ganglionic level infarction (caudate, lentiform nuclei, internal capsule, insula, M1-M3 cortex): 6 - Supraganglionic infarction (M4-M6 cortex): 3 Total score (0-10 with 10 being normal): 9 Critical/emergent results were called by telephone at the time of interpretation on 09/15/2021 at 3:45 pm to provider JULIE HAVILAND , who verbally acknowledged these results. IMPRESSION: 1. Hyperdense left M1 MCA segment highly suspicious for the presence of endoluminal thrombus. 2. Suspected subtle acute infarct involving the left insula. ASPECTS is 9. 3. Small left MCA territory cortically-based infarct within the left parietal lobe (with involvement of the postcentral gyrus), favored chronic. 4. Background mild chronic small ischemic changes within the cerebral white matter. This includes a chronic lacunar infarct within the right centrum semiovale/corona radiata. 5. 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 6. Mild generalized parenchymal atrophy. Electronically Signed   By: Kellie Simmering D.O.   On: 09/15/2021 15:50     PHYSICAL EXAM  Temp:  [97.9 F (36.6 C)-98.7 F (37.1  C)] 98.4 F (36.9 C) (06/19 1100) Pulse Rate:  [45-83] 51 (06/19 0900) Resp:  [19-30] 25 (06/19 1200) BP: (116-141)/(58-75) 130/62 (06/19 1200) SpO2:  [92 %-98 %] 97 % (06/19 0900)  General -frail elderly Caucasian male.  Not in distress.  Ophthalmologic - fundi not visualized due to noncooperation.  Cardiovascular - irregularly irregular heart rate and rhythm.  Neuro -awake and alert and interactive  able to follow all simple commands, no aphasia but paucity of speech, able to name and repeat in dysarthric voice. Eyes mid position, visual field full, no gaze palsy, PERRL. Mild right facial droop and tongue protrusion not complete. BUE 3/5 and slight drift bilaterally, b/l LE at least 3-/5 bilaterally symmetrical. DTR 1+ and no babinski. Sensation symmetrical, coordination b/l FTN grossly intact but slow on action, and gait not tested.    ASSESSMENT/PLAN Mr. Maejor Erven is a 81 y.o. male with history of CHF, A-fib on Eliquis CKD, hypertension, diabetes, CHF, PE in 2022, chronic DVT, aortic stenosis status post AVR admitted for aphasia, right-sided weakness, right facial droop. No tPA given due to on Eliquis.    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 IR with left M1 occlusion s/p TICI3 reperfusion MRI left BG, CR and mesial temporal lobe small infarcts MRA left M1 occlusion recannulized, patent left MCA.  Stable intracranial atherosclerosis and stenosis most notably both ICA siphon, right MCA bifurcation and distal right VA 2D Echo EF 20 to 25% LDL 66 HgbA1c 5.9 Heparin subcu for VTE prophylaxis Eliquis (apixaban) daily  prior to admission, now on heparin IV. Transition to Eliquis Ongoing aggressive stroke risk factor management Therapy recommendations: CIR Disposition: Pending  Respiratory failure Aspiration Intubated for procedure and airway protection Had a massive aspiration 6/15  On Rocephin Extubated 6/17 and tolerating well CCM on board  Chronic A-fib On Eliquis at home Rate controlled Also on amiodarone and metoprolol Follow-up with Dr. Jac Canavan at [place] now on heparin IV, transition to po once po access  Cardiomyopathy EF 20 to 25% Follows with Dr. Jac Canavan at [place] Not on significant heart failure regimen given hypotension and bradycardia. now on heparin IV, transition to po once po access  Ileus, improving Likely the cause of large vomitus with aspiration Bowel rest NPO now CCM on board  Diabetes HgbA1c 5.9 goal < 7.0 Controlled CBG monitoring SSI DM education and close PCP follow up  History of hypertension Hypotension BP on the low end On Levophed CCM on board Long term BP goal normotensive  Lipid management Home meds: None LDL 66, goal < 70 No statin for now given advanced age and LDL at goal as well as no po access  AKI on CKD CKD 3, creatinine 1.60-1.39->2.43->1.54 CCM on board Avoid nephrotoxic meds  Other Stroke Risk Factors Advanced age PE in 2022 Chronic DVT  Other Active Problems AS s/p bioprosthetic aortic valve    Hospital day # 5 Plan mobilize out of bed.  Continue ongoing therapies.  Transfer to neurology floor bed.  Change IV heparin to Eliquis.  Change metoprolol back to home dose of Coreg.  Discussed with patient, wife, pharmacist and Dr. Lake Bells. This patient is critically ill and at significant risk of neurological worsening, death and care requires constant monitoring of vital signs, hemodynamics,respiratory and cardiac monitoring, extensive review of multiple databases, frequent neurological assessment, discussion with  family, other specialists and medical decision making of high complexity.I have made any additions or clarifications directly to the above note.This critical care time does not reflect procedure time, or teaching time or  supervisory time of PA/NP/Med Resident etc but could involve care discussion time.  I spent 30 minutes of neurocritical care time  in the care of  this patient.       Antony Contras, MD Stroke Neurology 09/20/2021 1:08 PM    To contact Stroke Continuity provider, please refer to [email or web address] After hours, contact General Neurology

## 2021-09-20 NOTE — Progress Notes (Signed)
[facility]  Moving out of ICU PCCM will sign off  Heber Deer Lodge, MD [place] [facility] Pager: [phone] Cell: [phone] After 7:00 pm call Elink  [phone]

## 2021-09-20 NOTE — Progress Notes (Signed)
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  CTA head 6/14 > occluded M1 segment of L MCA.CTH demonstrated hyperdense L M1 MCA segment with suspected subtle acute infarct involving left insula. CTA head demonstrated occluded M1 segment of L MCA. MRI brain 6/15 > patchy acute infarct in the left BG and left medial temporal lobe. MRA brain 6/15 > Recanalized Left MCA M1 segment. Patent left MCA bifurcation. Upstream ICA atherosclerosis and stenosis, and downstream MCA M2 branch stenosis appears unchanged from the CTA yesterday. Echo 6/15 >  LVEF 20-25%, global hypokinesis. Grade 1 DD. Well seated bioprosthetic aortic valve.  6/15 Massive aspiration. Increasing vent settings. Pressors. 6/18 Lethargic today, but awake and able to speak/answer questions, MAE, no BM yet   Interim History / Subjective:  Suspect AM labs erroneous > drawn from D5w line  Afebrile  On 3L [place]  Glucose range 107-109, suspect reading of 348 from line with dextrose  I/O 2.4L UOP, - in last 24 hours  Pt indicates he is hungry, ready to eat.  Was not cleared by speech, pending re-eval 6/19.  Also reports double vision at times.   Objective   Blood pressure 136/66, pulse (!) 48, temperature 98.7 F (37.1  C), temperature source Oral, resp. rate 20, height 6\' 1"  (1.854 m), weight 75.2 kg, SpO2 96 %.        Intake/Output Summary (Last 24 hours) at 09/20/2021 0817 Last data filed at 09/20/2021 0701 Gross per 24 hour  Intake 2129.67 ml  Output 2400 ml  Net -270.33 ml   Filed Weights   09/15/21 1527  Weight: 75.2 kg    Examination: General: elderly male lying in bed in NAD  HEENT: MM pink/moist, edentulous, anicteric Neuro: Awake, alert, speech clear, MAE, generalized weakness 4/5 global strength but symmetric  CV: s1s2 irr irr, AF in 50's-70's, no m/r/g PULM: non-labored at rest, lungs bilaterally clear GI: soft, bsx4 active  Extremities: warm/dry, no edema  Skin: no rashes or lesions  Resolved Hospital Problem list   Acute hypoxemic respiratory failure > resolved Cheyne Stoke's breathing on 6/17,> resolved Septic Shock from Aspiration PNA, NOS   Assessment & Plan:   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 -stroke rec's per primary / Stroke Team  -continue secondary stroke prevention  -statin  -PT, OT, SLP appreciated  -OOB to chair   Aspiration Pneumonia, NOS -ceftriaxone D5/5  -follow intermittent CXR   Chronic HFrEF Not on b-blocker or ACE at baseline -tele monitoring  -low dose lopressor 12.5 BID   AKI  -Trend BMP / urinary output -Replace electrolytes as indicated -Avoid nephrotoxic agents, ensure adequate renal perfusion  Ileus KUB with resolution of dilated loops of small bowel, contributed to aspiration  -continue ice chips  -await SLP repeat evaluation   Atrial  Fibrillation -tele monitoring  -continue heparin infusion  -defer timing of DOAC transition to Stroke Team   PE & DVT 01/2021 -continue heparin infusion per pharmacy   Thrombocytopenia  -plan for repeat labs in am, suspect they were drawn without waste   At Risk Hypoglycemia  -continue D5w until consistently taking PO's   Hypothyroidism -continue synthroid    Best Practice (right click and "Reselect all SmartList Selections" daily)  Diet/type: tubefeeds  DVT prophylaxis: systemic heparin GI prophylaxis: PPI Lines: Central line and yes and it is still needed Foley:  N/A Code Status:  full code Last date of multidisciplinary goals of care discussion: per primary    OK to transition out of ICU per PCCM, defer to Stroke Team. PCCM will be available PRN.  Please call back if new needs arise.   Critical care time: n/a minutes   Canary Brim, MSN, APRN, NP-C, AGACNP-BC [facility] 09/20/2021, 8:17 AM   Please see [email or web address] for pager details.   From 7A-7P if no response, please call [phone] After hours, please call ELink [phone]

## 2021-09-20 NOTE — Progress Notes (Signed)
Nutrition Follow-up  DOCUMENTATION CODES:   Severe malnutrition in context of chronic illness  INTERVENTION:   - Ensure Enlive po TID, each supplement provides 350 kcal and 20 grams of protein  - MVI with minerals daily  - Encourage PO intake  NUTRITION DIAGNOSIS:   Severe Malnutrition related to chronic illness (CHF, CKD stage IIIa) as evidenced by severe fat depletion, severe muscle depletion.  Ongoing, being addressed via diet advancement and oral nutrition supplements  GOAL:   Patient will meet greater than or equal to 90% of their needs  Progressing  MONITOR:   PO intake, Supplement acceptance, Labs, Weight trends, Skin  REASON FOR ASSESSMENT:   Ventilator    ASSESSMENT:   81 year old male who presented to the ED on 6/14 as a Code Stroke. PMH of CKD stage IIIa, T2DM, CHF, PE, atrial fibrillation. Pt required intubation. Pt admitted with L MCA CVA.  06/14 - s/p mechanical thrombectomy with complete recanalization 06/15 - large volume emesis (>1 L), concern for ileus 06/17 - extubated 06/18 - SLP evaluation with recommendations for NPO 06/19 - diet advanced to regular with thin liquids  Discussed pt with RN and during ICU rounds. Pt cleared for a regular diet with thin liquids this morning after SLP evaluation.  Spoke with pt at bedside. He has not yet received a meal tray but is eager to eat. Pt willing to try Ensure supplements once RD explained what they were. RD to order. Discussed importance of adequate kcal and protein intake in promoting healing and maintaining lean muscle mass.  Medications reviewed and include: SSI q 4 hours, miralax, senna, IV abx  Labs reviewed: sodium 130, potassium 3.3, platelets 52 CBG's: 104-124 x 24 hours  UOP: 2400 ml x 24 hours I/O's: -1.3 ml since admit  Diet Order:   Diet Order             Diet regular Room service appropriate? Yes; Fluid consistency: Thin  Diet effective now                   EDUCATION  NEEDS:   Education needs have been addressed  Skin:  Skin Assessment: Skin Integrity Issues: Stage I: sacrum  Last BM:  no documented BM  Height:   Ht Readings from Last 1 Encounters:  09/15/21 6\' 1"  (1.854 m)    Weight:   Wt Readings from Last 1 Encounters:  09/15/21 75.2 kg    BMI:  Body mass index is 21.87 kg/m.  Estimated Nutritional Needs:   Kcal:  1800-2000  Protein:  90-105 grams  Fluid:  1.8-2.0 L    09/17/21, MS, RD, LDN Inpatient Clinical Dietitian Please see AMiON for contact information.

## 2021-09-20 NOTE — Progress Notes (Signed)
Wife and step-daughter updated at bedside.     Correction to prior note, patient in SR 50-70's.  Regular rate / rhythm.        Canary Brim, MSN, APRN, NP-C, AGACNP-BC [facility] 09/20/2021, 9:11 AM   Please see [email or web address] for pager details.   From 7A-7P if no response, please call [phone] After hours, please call ELink [phone]

## 2021-09-20 NOTE — Progress Notes (Signed)
Speech Language Pathology Treatment: Dysphagia;Cognitive-Linquistic  Patient Details Name: Eugene Campbell MRN: [id number] DOB: 01-06-1941 Today's Date: 09/20/2021 Time: 1916-6060 SLP Time Calculation (min) (ACUTE ONLY): 14 min  Assessment / Plan / Recommendation Clinical Impression  Much more alert, responsive and conversive this morning. Holding cup and spoon with assist due to decreased dexterity. Initiated and coordinated swallow with thin (cup/straw), puree and graham cracker after pt donned dentures with wife present. Delayed cough after cracker which is not uncommon after cracker not related to water. No other episode of coughing. Recommend regular/thin, pills with applesauce, straws, sit upright and brief follow up for swallow to ensure tolerance.   Conversive with therapist and wife, overt aphasia not detected for basic conversation. Oriented to place and situation, needed min prompt to use call button to call for assist. Speech was intelligible. Will continue to treat for swallow and communication/cognition.    HPI HPI: Pt is an 81 y/o male who presented with sudden onset of R sided weakness, facial droop and aphasia. 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 Pt remained intubated after procedure with complications due to acute hypoxic respiratory failure and massive aspiration. Pt extubated 6/17. PMH: a fib, HTN, PT and DVT, clotting disorder, DM2      SLP Plan  Continue with current plan of care      Recommendations for follow up therapy are one component of a multi-disciplinary discharge planning process, led by the attending physician.  Recommendations may be updated based on patient status, additional functional criteria and insurance authorization.    Recommendations  Diet recommendations: Regular;Thin liquid Liquids provided via: Cup;Straw Medication Administration: Whole  meds with puree Supervision: Staff to assist with self feeding Compensations: Slow rate;Small sips/bites Postural Changes and/or Swallow Maneuvers: Seated upright 90 degrees                General recommendations: Rehab consult Oral Care Recommendations: Oral care BID Follow Up Recommendations: Acute inpatient rehab (3hours/day) Assistance recommended at discharge: Frequent or constant Supervision/Assistance SLP Visit Diagnosis: Dysphagia, unspecified (R13.10);Cognitive communication deficit (O45.997) Plan: Continue with current plan of care           Royce Macadamia  09/20/2021, 9:18 AM

## 2021-09-20 NOTE — Progress Notes (Addendum)
ANTICOAGULATION CONSULT NOTE  Pharmacy Consult for heparin Indication: atrial fibrillation Brief A/P: Heparin level subtherapeutic Increase Heparin rate  Allergies  Allergen Reactions   Penicillins Hives, Swelling, Rash and Other (See Comments)    Peeling skin and caused the gums to peel/crack    Patient Measurements: Height: 6\' 1"  (185.4 cm) Weight: 75.2 kg (165 lb 12.6 oz) IBW/kg (Calculated) : 79.9 Heparin Dosing Weight: 75 kg  Vital Signs: Temp: 97.9 F (36.6 C) (06/18 2324) Temp Source: Oral (06/18 2324) BP: 130/62 (06/18 2200) Pulse Rate: 65 (06/18 2200)  Labs: Recent Labs    09/17/21 0418 09/17/21 2000 09/18/21 0345 09/19/21 0347 09/19/21 1410 09/19/21 1513 09/19/21 2345  HGB 15.8  --  13.9 12.5*  --   --   --   HCT 46.3  --  40.5 37.3*  --   --   --   PLT 104*  --  73* 65*  --   --   --   HEPARINUNFRC  --    < > 0.41 0.15* <0.10* 0.10* 0.11*  CREATININE 2.43*  --  2.12* 1.54*  --   --   --    < > = values in this interval not displayed.     Estimated Creatinine Clearance: 40.7 mL/min (A) (by C-G formula based on SCr of 1.54 mg/dL (H)).  Assessment: 81 y.o. male admitted with CVA, h/o Afib and Eliquis on hold, for heparin Goal of Therapy:  Heparin level 0.3-0.7 units/ml Monitor platelets by anticoagulation protocol: Yes Plan:  Increase Heparin 1550 units/hr Check heparin level in 8 hours.    96, PharmD, BCPS  09/20/2021 12:11 AM

## 2021-09-21 DIAGNOSIS — I639 Cerebral infarction, unspecified: Secondary | ICD-10-CM | POA: Diagnosis not present

## 2021-09-21 LAB — CBC
HCT: 36.6 % — ABNORMAL LOW (ref 39.0–52.0)
Hemoglobin: 12.1 g/dL — ABNORMAL LOW (ref 13.0–17.0)
MCH: 32.4 pg (ref 26.0–34.0)
MCHC: 33.1 g/dL (ref 30.0–36.0)
MCV: 97.9 fL (ref 80.0–100.0)
Platelets: 64 10*3/uL — ABNORMAL LOW (ref 150–400)
RBC: 3.74 MIL/uL — ABNORMAL LOW (ref 4.22–5.81)
RDW: 13.5 % (ref 11.5–15.5)
WBC: 5.5 10*3/uL (ref 4.0–10.5)
nRBC: 0 % (ref 0.0–0.2)

## 2021-09-21 LAB — BASIC METABOLIC PANEL
Anion gap: 8 (ref 5–15)
BUN: 21 mg/dL (ref 8–23)
CO2: 27 mmol/L (ref 22–32)
Calcium: 8.5 mg/dL — ABNORMAL LOW (ref 8.9–10.3)
Chloride: 105 mmol/L (ref 98–111)
Creatinine, Ser: 0.98 mg/dL (ref 0.61–1.24)
GFR, Estimated: >60 mL/min (ref >60)
Glucose, Bld: 101 mg/dL — ABNORMAL HIGH (ref 70–99)
Potassium: 3.8 mmol/L (ref 3.5–5.1)
Sodium: 140 mmol/L (ref 135–145)

## 2021-09-21 LAB — GLUCOSE, CAPILLARY
Glucose-Capillary: 114 mg/dL — ABNORMAL HIGH (ref 70–99)
Glucose-Capillary: 143 mg/dL — ABNORMAL HIGH (ref 70–99)
Glucose-Capillary: 136 mg/dL — ABNORMAL HIGH (ref 70–99)
Glucose-Capillary: 118 mg/dL — ABNORMAL HIGH (ref 70–99)

## 2021-09-21 NOTE — Progress Notes (Signed)
Speech Language Pathology Treatment: Cognitive-Linquistic  Patient Details Name: Eugene Campbell MRN: [id number] DOB: 02-18-1941 Today's Date: 09/21/2021 Time: 3710-6269 SLP Time Calculation (min) (ACUTE ONLY): 15 min  Assessment / Plan / Recommendation Clinical Impression  Patient seen by SLP for skilled treatment session focused on cognitive and communicative skills. Patient's wife and step daughter present in room. They report that patient has been demonstrating improved alertness and step daughter report's that she only notices some decline in function when he gets tired. When SLP entered room, patient was in bed, having just finished PT session and was hungry as his lunch had not arrived. Patient's speech was clear with only mild decreased speech quality from loose fitting dentures. Patient was somewhat fatigued but alert and able to communicate at conversational level with SLP without difficulty. He independently started feeding himself applesauce by using his left (non-dominant) hand to hold the spoon, showing good problem solving. He was not demonstrating any of the language deficits which were noted on evaluation (he was noted to be lethargic at that time which likely contributed.). He was a little slow to respond at times and benefited from SLP verbally cueing to direct conversation. SLP is recommending continued acute level treatment for cognitive-linguistic abilities and recommendation continues to be for inpatient rehab.   HPI HPI: Pt is an 81 y/o male who presented with sudden onset of R sided weakness, facial droop and aphasia. 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 Pt remained intubated after procedure with complications due to acute hypoxic respiratory failure and massive aspiration. Pt extubated 6/17. PMH: a fib, HTN, PT and DVT, clotting disorder, DM2      SLP Plan  Continue with  current plan of care      Recommendations for follow up therapy are one component of a multi-disciplinary discharge planning process, led by the attending physician.  Recommendations may be updated based on patient status, additional functional criteria and insurance authorization.    Recommendations                   Follow Up Recommendations: Acute inpatient rehab (3hours/day) Assistance recommended at discharge: Frequent or constant Supervision/Assistance SLP Visit Diagnosis: Cognitive communication deficit (S85.462) Plan: Continue with current plan of care           Angela Nevin, MA, CCC-SLP Speech Therapy

## 2021-09-21 NOTE — Progress Notes (Signed)
PICC line removal order placed.  Upon arrival, pt in chair.  Spoke with RN and pt will have therapy and then will make sure in bed.  Will communicate with me via secure chat.

## 2021-09-21 NOTE — Progress Notes (Signed)
Received report from night shift and patient has a triple lumen Right arm PICC line as well as a peripheral line in Right AC. As it seemed to be no longer indicated now that the patient is out of the ICU, this RN asked the primary doctor if we could remove the PICC line. Orders placed per protocol for IV team to remove PICC line.

## 2021-09-21 NOTE — Progress Notes (Signed)
Patient and family members state that they understand the diagnosis of diabetes but are refusing insulin injections due to family history of adverse reactions when given insulin.

## 2021-09-21 NOTE — Progress Notes (Signed)
RestartSTROKE TEAM PROGRESS NOTE   SUBJECTIVE (INTERVAL HISTORY) His daughter is at the bedside.  Pt continues to do well and has been breathing fine.  He is working with the therapist.  Neurologically stable.  Vital signs stable. OBJECTIVE Temp:  [97.6 F (36.4 C)-98.6 F (37 C)] 98.6 F (37 C) (06/20 1142) Pulse Rate:  [57-66] 66 (06/20 1142) Cardiac Rhythm: Atrial fibrillation;Bundle branch block (06/20 0700) Resp:  [18-25] 18 (06/20 1142) BP: (120-140)/(51-82) 121/77 (06/20 1142) SpO2:  [92 %-98 %] 95 % (06/20 1142)  Recent Labs  Lab 09/20/21 1155 09/20/21 1744 09/20/21 2220 09/21/21 0614 09/21/21 1155  GLUCAP 112* 85 122* 114* 143*   Recent Labs  Lab 09/15/21 1946 09/16/21 0409 09/17/21 0418 09/18/21 0345 09/19/21 0347 09/20/21 0508 09/21/21 0122  NA  --    < > 134* 132* 133* 130* 140  K  --    < > 5.1 5.3* 4.0 3.3* 3.8  CL  --    < > 103 98 98 96* 105  CO2  --    < > _0 GLUCOSE  --    < > 144* 110* 93 348* 101*  BUN  --    < > 31* 40* 39* 22 21  CREATININE  --    < > 2.43* 2.12* 1.54* 1.08 0.98  CALCIUM  --    < > 7.7* 7.8* 7.9* 7.7* 8.5*  MG 1.9  --  2.4 2.4  --   --   --   PHOS 2.2*  --  7.2* 5.8*  --   --   --    < > = values in this interval not displayed.   Recent Labs  Lab 09/15/21 1525 09/17/21 0418 09/18/21 0345  AST 34 31 28  ALT 28 41 31  ALKPHOS 94 82 78  BILITOT 0.8 0.9 0.7  PROT 5.9* 5.3* 5.1*  ALBUMIN 3.2* 2.6* 2.2*   Recent Labs  Lab 09/15/21 1525 09/15/21 1532 09/17/21 0418 09/18/21 0345 09/19/21 0347 09/20/21 0508 09/21/21 0122  WBC 7.8   < > 7.0 10.9* 10.6* 7.5 5.5  NEUTROABS 4.2  --  4.8  --   --   --   --   HGB 14.5   < > 15.8 13.9 12.5* 11.3* 12.1*  HCT 43.2   < > 46.3 40.5 37.3* 33.2* 36.6*  MCV 98.6   < > 97.3 96.7 97.4 97.1 97.9  PLT 109*   < > 104* 73* 65* 52* 64*   < > = values in this interval not displayed.   No results for input(s): "CKTOTAL", "CKMB", "CKMBINDEX", "TROPONINI" in the last 168  hours. No results for input(s): "LABPROT", "INR" in the last 72 hours.  No results for input(s): "COLORURINE", "LABSPEC", "PHURINE", "GLUCOSEU", "HGBUR", "BILIRUBINUR", "KETONESUR", "PROTEINUR", "UROBILINOGEN", "NITRITE", "LEUKOCYTESUR" in the last 72 hours.  Invalid input(s): "APPERANCEUR"      Component Value Date/Time   CHOL 133 09/16/2021 0409   TRIG 66 09/19/2021 0347   HDL 59 09/16/2021 0409   CHOLHDL 2.3 09/16/2021 0409   VLDL 8 09/16/2021 0409   LDLCALC 66 09/16/2021 0409   Lab Results  Component Value Date   HGBA1C 5.9 (H) 09/15/2021   No results found for: "LABOPIA", "COCAINSCRNUR", "LABBENZ", "AMPHETMU", "THCU", "LABBARB"  Recent Labs  Lab 09/15/21 1946  ETH <10    I have personally reviewed the radiological images below and agree with the radiology interpretations.  IR PERCUTANEOUS ART THROMBECTOMY/INFUSION INTRACRANIAL INC DIAG ANGIO  Addendum Date: 09/20/2021   ADDENDUM REPORT: 09/20/2021 62:83 ADDENDUM: Complications: No complications seen. Electronically Signed   By: Frazier Richards M.D.   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 Patient's last known normal was at 1430 NIH stroke scale of 24, modified Rankin scale of 1. TNK was not given as the patient was on apixaban. EXAM: ULTRASOUND-GUIDED VASCULAR ACCESS DIAGNOSTIC CEREBRAL ANGIOGRAM MECHANICAL THROMBECTOMY FLAT PANEL HEAD CT COMPARISON:  None Available. MEDICATIONS: Ancef 2 g IV. The antibiotic was administered within 1 hour of the procedure Performing surgeon: Dr. Frazier Richards Assistant: Dr. Karenann Cai ANESTHESIA/SEDATION: General anesthesia CONTRAST:  Twenty-five mL of Omnipaque 300 milligram/mL FLUOROSCOPY: Radiation Exposure Index (as provided by the fluoroscopic device): 662 mGy Kerma COMPLICATIONS: SIR Level A - No  therapy, no consequence. TECHNIQUE: Informed written consent was obtained from the patient's wife after a thorough discussion of the procedural risks, benefits and alternatives. All questions were addressed. Maximal Sterile Barrier Technique was utilized including caps, mask, sterile gowns, sterile gloves, sterile drape, hand hygiene and skin antiseptic. A timeout was performed prior to the initiation of the procedure. The right groin was prepped and draped in the usual sterile fashion. Using a micropuncture kit and the modified Seldinger technique, access was gained to the right common femoral artery and an 8 [place] by 25 cm sheath was placed. Real-time ultrasound guidance was utilized for vascular access including the acquisition of a permanent ultrasound image documenting patency of the accessed vessel. Under fluoroscopy, a Zoom 88 guide catheter was navigated over a 5 [place] VTK catheter and a 0.035" Terumo Glidewire into the aortic arch. The catheter was placed into the left common carotid artery and then advanced into the left internal carotid artery under roadmap guidance. The diagnostic catheter was removed. Frontal and lateral angiograms of the head were obtained. FINDINGS: 1. Normal caliber of the right common femoral artery, adequate for vascular access. 2. Occlusion of a proximal left M1/MCA. 3. Delayed collaterals from the left ACA to the left MCA vascular tree. PROCEDURE: Using biplane roadmap, a zoom 071 aspiration catheter was navigated over an Aristotle 24 microguidewire into the cavernous segment of the left ICA. The aspiration catheter was then advanced to the level of occlusion at the left M1 MCA and connected to an aspiration pump. Continuous aspiration was performed for 2 minutes. The guide catheter was connected to a VacLok syringe. The aspiration catheter was subsequently removed under constant aspiration. The guide catheter was aspirated for debris. Then, biplane DSA cerebral angiogram was  performed by injecting contrast through the guide catheter which demonstrated complete recanalization of the proximal left MCA and its branches (TICI 3). The MCA and its branches have a normal appearance. No aneurysm, av malformation, dural fistula or vasculitis seen. The venous phase of the cerebral angiogram has a normal appearance with a preferential venous return into the right transverse, sigmoid sinuses and the into the internal jugular vein. Mild narrowing of the left transverse sinus. Flat panel CT of the head was obtained and post processed in a separate workstation with concurrent attending physician supervision. Selected images were sent to PACS. Flat panel CT of the head has a normal appearance without evidence of a bleed. Right common femoral artery angiogram was obtained in right anterior oblique view. The puncture is at the level of the common femoral artery.  The artery has normal caliber, adequate for closure device. The sheath was exchanged over the wire for a Perclose prostyle which was utilized for access closure. Immediate hemostasis was achieved. IMPRESSION: 1. Successful mechanical thrombectomy for treatment of an occlusion of the proximal left M1/MCA. Complete recanalization (TICI 3) of the left MCA was attained. 2. Flat panel CT head was unremarkable. PLAN: 1. Transfer to ICU. 2. Blood pressure goals of 120-140 mm Hg. Electronically Signed: By: Frazier Richards M.D. On: 09/16/2021 10:22   IR US Guide Vasc Access Right  Addendum Date: 09/20/2021   ADDENDUM REPORT: 09/20/2021 16:10 ADDENDUM: Complications: No complications seen. Electronically Signed   By: Frazier Richards M.D.   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 Patient's last known normal was at 1430 NIH stroke scale of 24, modified Rankin scale of 1. TNK  was not given as the patient was on apixaban. EXAM: ULTRASOUND-GUIDED VASCULAR ACCESS DIAGNOSTIC CEREBRAL ANGIOGRAM MECHANICAL THROMBECTOMY FLAT PANEL HEAD CT COMPARISON:  None Available. MEDICATIONS: Ancef 2 g IV. The antibiotic was administered within 1 hour of the procedure Performing surgeon: Dr. Frazier Richards Assistant: Dr. Karenann Cai ANESTHESIA/SEDATION: General anesthesia CONTRAST:  Twenty-five mL of Omnipaque 300 milligram/mL FLUOROSCOPY: Radiation Exposure Index (as provided by the fluoroscopic device): 960 mGy Kerma COMPLICATIONS: SIR Level A - No therapy, no consequence. TECHNIQUE: Informed written consent was obtained from the patient's wife after a thorough discussion of the procedural risks, benefits and alternatives. All questions were addressed. Maximal Sterile Barrier Technique was utilized including caps, mask, sterile gowns, sterile gloves, sterile drape, hand hygiene and skin antiseptic. A timeout was performed prior to the initiation of the procedure. The right groin was prepped and draped in the usual sterile fashion. Using a micropuncture kit and the modified Seldinger technique, access was gained to the right common femoral artery and an 8 [place] by 25 cm sheath was placed. Real-time ultrasound guidance was utilized for vascular access including the acquisition of a permanent ultrasound image documenting patency of the accessed vessel. Under fluoroscopy, a Zoom 88 guide catheter was navigated over a 5 [place] VTK catheter and a 0.035" Terumo Glidewire into the aortic arch. The catheter was placed into the left common carotid artery and then advanced into the left internal carotid artery under roadmap guidance. The diagnostic catheter was removed. Frontal and lateral angiograms of the head were obtained. FINDINGS: 1. Normal caliber of the right common femoral artery, adequate for vascular access. 2. Occlusion of a proximal left M1/MCA. 3. Delayed collaterals from the left ACA to the  left MCA vascular tree. PROCEDURE: Using biplane roadmap, a zoom 071 aspiration catheter was navigated over an Aristotle 24 microguidewire into the cavernous segment of the left ICA. The aspiration catheter was then advanced to the level of occlusion at the left M1 MCA and connected to an aspiration pump. Continuous aspiration was performed for 2 minutes. The guide catheter was connected to a VacLok syringe. The aspiration catheter was subsequently removed under constant aspiration. The guide catheter was aspirated for debris. Then, biplane DSA cerebral angiogram was performed by injecting contrast through the guide catheter which demonstrated complete recanalization of the proximal left MCA and its branches (TICI 3). The MCA and its branches have a normal appearance. No aneurysm, av malformation, dural fistula or vasculitis seen. The venous phase of the cerebral  angiogram has a normal appearance with a preferential venous return into the right transverse, sigmoid sinuses and the into the internal jugular vein. Mild narrowing of the left transverse sinus. Flat panel CT of the head was obtained and post processed in a separate workstation with concurrent attending physician supervision. Selected images were sent to PACS. Flat panel CT of the head has a normal appearance without evidence of a bleed. Right common femoral artery angiogram was obtained in right anterior oblique view. The puncture is at the level of the common femoral artery. The artery has normal caliber, adequate for closure device. The sheath was exchanged over the wire for a Perclose prostyle which was utilized for access closure. Immediate hemostasis was achieved. IMPRESSION: 1. Successful mechanical thrombectomy for treatment of an occlusion of the proximal left M1/MCA. Complete recanalization (TICI 3) of the left MCA was attained. 2. Flat panel CT head was unremarkable. PLAN: 1. Transfer to ICU. 2. Blood pressure goals of 120-140 mm Hg.  Electronically Signed: By: Frazier Richards M.D. On: 09/16/2021 10:22   IR CT Head Ltd  Addendum Date: 09/20/2021   ADDENDUM REPORT: 09/20/2021 72:09 ADDENDUM: Complications: No complications seen. Electronically Signed   By: Frazier Richards M.D.   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 Patient's last known normal was at 1430 NIH stroke scale of 24, modified Rankin scale of 1. TNK was not given as the patient was on apixaban. EXAM: ULTRASOUND-GUIDED VASCULAR ACCESS DIAGNOSTIC CEREBRAL ANGIOGRAM MECHANICAL THROMBECTOMY FLAT PANEL HEAD CT COMPARISON:  None Available. MEDICATIONS: Ancef 2 g IV. The antibiotic was administered within 1 hour of the procedure Performing surgeon: Dr. Frazier Richards Assistant: Dr. Karenann Cai ANESTHESIA/SEDATION: General anesthesia CONTRAST:  Twenty-five mL of Omnipaque 300 milligram/mL FLUOROSCOPY: Radiation Exposure Index (as provided by the fluoroscopic device): 470 mGy Kerma COMPLICATIONS: SIR Level A - No therapy, no consequence. TECHNIQUE: Informed written consent was obtained from the patient's wife after a thorough discussion of the procedural risks, benefits and alternatives. All questions were addressed. Maximal Sterile Barrier Technique was utilized including caps, mask, sterile gowns, sterile gloves, sterile drape, hand hygiene and skin antiseptic. A timeout was performed prior to the initiation of the procedure. The right groin was prepped and draped in the usual sterile fashion. Using a micropuncture kit and the modified Seldinger technique, access was gained to the right common femoral artery and an 8 [place] by 25 cm sheath was placed. Real-time ultrasound guidance was utilized for vascular access including the acquisition of a permanent ultrasound image documenting patency of the  accessed vessel. Under fluoroscopy, a Zoom 88 guide catheter was navigated over a 5 [place] VTK catheter and a 0.035" Terumo Glidewire into the aortic arch. The catheter was placed into the left common carotid artery and then advanced into the left internal carotid artery under roadmap guidance. The diagnostic catheter was removed. Frontal and lateral angiograms of the head were obtained. FINDINGS: 1. Normal caliber of the right common femoral artery, adequate for vascular access. 2. Occlusion of a proximal left M1/MCA. 3. Delayed collaterals from the left ACA to the left MCA vascular tree. PROCEDURE: Using biplane roadmap, a zoom 071 aspiration catheter was navigated over an Aristotle 24 microguidewire into the cavernous segment of the left ICA. The aspiration catheter was then advanced to the level of occlusion at the left  M1 MCA and connected to an aspiration pump. Continuous aspiration was performed for 2 minutes. The guide catheter was connected to a VacLok syringe. The aspiration catheter was subsequently removed under constant aspiration. The guide catheter was aspirated for debris. Then, biplane DSA cerebral angiogram was performed by injecting contrast through the guide catheter which demonstrated complete recanalization of the proximal left MCA and its branches (TICI 3). The MCA and its branches have a normal appearance. No aneurysm, av malformation, dural fistula or vasculitis seen. The venous phase of the cerebral angiogram has a normal appearance with a preferential venous return into the right transverse, sigmoid sinuses and the into the internal jugular vein. Mild narrowing of the left transverse sinus. Flat panel CT of the head was obtained and post processed in a separate workstation with concurrent attending physician supervision. Selected images were sent to PACS. Flat panel CT of the head has a normal appearance without evidence of a bleed. Right common femoral artery angiogram was obtained in  right anterior oblique view. The puncture is at the level of the common femoral artery. The artery has normal caliber, adequate for closure device. The sheath was exchanged over the wire for a Perclose prostyle which was utilized for access closure. Immediate hemostasis was achieved. IMPRESSION: 1. Successful mechanical thrombectomy for treatment of an occlusion of the proximal left M1/MCA. Complete recanalization (TICI 3) of the left MCA was attained. 2. Flat panel CT head was unremarkable. PLAN: 1. Transfer to ICU. 2. Blood pressure goals of 120-140 mm Hg. Electronically Signed: By: Frazier Richards M.D. On: 09/16/2021 10:22   DG Abd Portable 1V  Result Date: 09/19/2021 CLINICAL DATA:  Ileus. EXAM: PORTABLE ABDOMEN - 1 VIEW COMPARISON:  One-view abdomen 09/18/2021 FINDINGS: Previously noted dilated loops of small bowel have normalized. No obstruction or free air is present. Degenerative changes are present in the lower lumbar spine. IMPRESSION: 1. Interval resolution of dilated loops of small bowel. 2. No acute abnormality. Electronically Signed   By: San Morelle M.D.   On: 09/19/2021 14:54   DG Abd Portable 1V  Result Date: 09/18/2021 CLINICAL DATA:  Abdominal distention. EXAM: PORTABLE ABDOMEN - 1 VIEW COMPARISON:  09/16/2021. FINDINGS: Flat plate study in 2 films at 4:54 a.m., 09/18/2021. There is increased opacity in the left lung base consistent with consolidation, atelectasis or aspiration. Enteric catheter has been advanced with tip now superimposing over the left iliac crest. If the patient has had gastric bypass with gastrojejunostomy the tube is probably in a jejunal segment. Postsurgical changes are again noted in the left and right upper quadrant. There is improved gaseous distention of central abdominal large bowel segments. Currently no dilated small bowel is seen. Retained fecal burden appears somewhat improved particularly in the ascending and descending colon. Colonic gas remains  present through into the rectum. Visceral shadows are stable. There is no supine evidence of free air. In all other respects there are no further changes. IMPRESSION: 1. Increasingly dense consolidation in the left lower lobe consistent with atelectasis, pneumonia or aspiration. 2. Improved colonic distention, currently with no small bowel dilatation being seen. 3. Interval advancement of the enteric tube with the tip superimposing over the left iliac crest. If there has been a gastric bypass, the tip of the tube is probably in a jejunal segment past the gastrojejunostomy. Electronically Signed   By: Telford Nab M.D.   On: 09/18/2021 07:07   DG Chest Port 1 View  Result Date: 09/17/2021 CLINICAL DATA:  Hypoxia. 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 Endotracheal tube terminates at the level of the clavicles, well above the carina. Right PICC terminates over the SVC, unchanged. Enteric tube courses into the abdomen with tip not imaged. The cardiac silhouette remains mildly enlarged. Left perihilar and left lower lobe opacities are unchanged. No sizable pleural effusion or pneumothorax is identified. IMPRESSION: Unchanged left perihilar and lower lobe airspace disease concerning for pneumonia. Electronically Signed   By: Logan Bores M.D.   On: 09/17/2021 08:54   ECHOCARDIOGRAM COMPLETE  Result Date: 09/16/2021    ECHOCARDIOGRAM REPORT   Patient Name:   [facility] Date of Exam: 09/16/2021 Medical Rec #:  [id number]      Height:       73.0 in Accession #:    [id number]     Weight:       165.8 lb Date of Birth:  [birth year]      BSA:          1.987 m Patient Age:    36 years       BP:           102/60 mmHg Patient Gender: M              HR:           63 bpm. Exam Location:  Inpatient Procedure: 2D Echo, Cardiac Doppler and Color Doppler Indications:    Stroke  History:        Patient has no prior history of  Echocardiogram examinations.                 Aortic Valve Disease, Arrythmias:Atrial Fibrillation; Risk                 Factors:Former Smoker and Diabetes. AVR. Acute respiratory                 failure with hypoxia. HFrEF.                 Aortic Valve: valve is present in the aortic position. Procedure                 Date: unknown.  Sonographer:    Clayton Lefort RDCS (AE) Referring Phys: [id number] [place]  Sonographer Comments: Echo performed with patient supine and on artificial respirator. Unable to use Definity due to lack of line access per nurse. IMPRESSIONS  1. Left ventricular ejection fraction, by estimation, is 20 to 25%. The left ventricle has severely decreased function. The left ventricle demonstrates global hypokinesis. Left ventricular diastolic parameters are consistent with Grade I diastolic dysfunction (impaired relaxation).  2. Right ventricular systolic function is mildly reduced. The right ventricular size is mildly enlarged.  3. Left atrial size was mildly dilated.  4. Right atrial size was severely dilated.  5. The mitral valve is normal in structure. No evidence of mitral valve regurgitation. No evidence of mitral stenosis.  6. 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 The aortic valve has been repaired/replaced. Aortic valve regurgitation is not visualized. No aortic stenosis is present. There is a valve present in the aortic position. Procedure Date: unknown. Aortic valve mean gradient measures 8.0 mmHg. Aortic valve Vmax measures 2.00 m/s.  7. The inferior vena cava is normal in size with greater than 50% respiratory variability, suggesting right atrial pressure of 3 mmHg. FINDINGS  Left Ventricle: Left ventricular ejection fraction, by estimation, is  20 to 25%. The left ventricle has severely decreased function. The left ventricle demonstrates global hypokinesis. The left ventricular internal cavity size was normal in size. There is no left  ventricular hypertrophy. Left ventricular diastolic parameters are consistent with Grade I diastolic dysfunction (impaired relaxation). Right Ventricle: The right ventricular size is mildly enlarged. No increase in right ventricular wall thickness. Right ventricular systolic function is mildly reduced. Left Atrium: Left atrial size was mildly dilated. Right Atrium: Right atrial size was severely dilated. Pericardium: There is no evidence of pericardial effusion. Mitral Valve: The mitral valve is normal in structure. Mild to moderate mitral annular calcification. No evidence of mitral valve regurgitation. No evidence of mitral valve stenosis. Tricuspid Valve: The tricuspid valve is normal in structure. Tricuspid valve regurgitation is not demonstrated. No evidence of tricuspid stenosis. 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 The aortic valve has been repaired/replaced. Aortic valve regurgitation is not visualized. No aortic stenosis is present. Aortic valve mean gradient measures 8.0 mmHg. Aortic valve peak gradient measures 16.0 mmHg. Aortic valve area, by VTI measures 1.10 cm. There is a valve present in the aortic position. Procedure Date: unknown. Pulmonic Valve: The pulmonic valve was not well visualized. Pulmonic valve regurgitation is trivial. No evidence of pulmonic stenosis. Aorta: The aortic root is normal in size and structure. Venous: The inferior vena cava is normal in size with greater than 50% respiratory variability, suggesting right atrial pressure of 3 mmHg. IAS/Shunts: No atrial level shunt detected by color flow Doppler.  LEFT VENTRICLE PLAX 2D LVIDd:         5.30 cm      Diastology LVIDs:         4.90 cm      LV e' medial:    4.13 cm/s LV PW:         1.20 cm      LV E/e' medial:  19.8 LV IVS:        2.00 cm      LV e' lateral:   9.14 cm/s LVOT diam:     2.00 cm      LV E/e' lateral: 8.9 LV SV:         38 LV SV Index:   19 LVOT Area:     3.14  cm  LV Volumes (MOD) LV vol d, MOD A2C: 158.0 ml LV vol d, MOD A4C: 170.0 ml LV vol s, MOD A2C: 94.3 ml LV vol s, MOD A4C: 139.0 ml LV SV MOD A2C:     63.7 ml LV SV MOD A4C:     170.0 ml LV SV MOD BP:      53.6 ml RIGHT VENTRICLE RV Basal diam:  3.20 cm RV S prime:     6.74 cm/s TAPSE (M-mode): 0.8 cm LEFT ATRIUM             Index        RIGHT ATRIUM           Index LA diam:        3.90 cm 1.96 cm/m   RA Area:     25.90 cm LA Vol (A2C):   66.4 ml 33.42 ml/m  RA Volume:   85.90 ml  43.24 ml/m LA Vol (A4C):   56.6 ml 28.49 ml/m LA Biplane Vol: 66.5 ml 33.47 ml/m  AORTIC VALVE AV Area (Vmax):    1.20 cm AV Area (Vmean):   1.25 cm AV Area (VTI):  1.10 cm AV Vmax:           200.00 cm/s AV Vmean:          131.000 cm/s AV VTI:            0.349 m AV Peak Grad:      16.0 mmHg AV Mean Grad:      8.0 mmHg LVOT Vmax:         76.40 cm/s LVOT Vmean:        52.300 cm/s LVOT VTI:          0.122 m LVOT/AV VTI ratio: 0.35  AORTA Ao Root diam: 2.70 cm Ao Asc diam:  4.00 cm MITRAL VALVE MV Area (PHT): 4.06 cm    SHUNTS MV Decel Time: 187 msec    Systemic VTI:  0.12 m MV E velocity: 81.80 cm/s  Systemic Diam: 2.00 cm Kardie Tobb DO Electronically signed by Berniece Salines DO Signature Date/Time: 09/16/2021/5:54:30 PM    Final    DG CHEST PORT 1 VIEW  Result Date: 09/16/2021 CLINICAL DATA:  PICC line placement. EXAM: PORTABLE CHEST 1 VIEW COMPARISON:  Chest x-ray 09/16/2021 FINDINGS: Endotracheal tube tip is 5.2 cm above the carina. Enteric tube extends below the diaphragm. There is a new right upper extremity PICC terminating in the SVC. Patient is status post cardiac surgery and valve replacement, unchanged. Cardiac silhouette is enlarged. There is some increasing retrocardiac opacities. No pleural effusion or pneumothorax identified. Osseous structures are stable. IMPRESSION: 1. Right upper extremity PICC terminates in the SVC. 2. Increasing retrocardiac atelectasis/airspace disease. 3. Stable cardiomegaly. Electronically  Signed   By: Ronney Asters M.D.   On: 09/16/2021 15:30   [place] EKG SITE RITE  Result Date: 09/16/2021 If Site Rite image not attached, placement could not be confirmed due to current cardiac rhythm.  DG Abd Portable 1V  Addendum Date: 09/16/2021   ADDENDUM REPORT: 09/16/2021 08:27 ADDENDUM: Study discussed by telephone with Nurse Apolonio Schneiders on 09/16/2021 at 0823 hours. Electronically Signed   By: Genevie Ann M.D.   On: 09/16/2021 08:27   Result Date: 09/16/2021 CLINICAL DATA:  81 year old male with projectile vomiting. EXAM: PORTABLE ABDOMEN - 1 VIEW COMPARISON:  09/15/2021. FINDINGS: Portable AP supine view at 0756 hours. Enteric tube appears pulled back slightly, side hole now at the expected level of the GEJ. Stable epigastric and right upper quadrant surgical clips. Negative visible lung bases. Increasing gas-filled bowel loops throughout the abdomen and pelvis, superimposed on retained stool in the colon. Possible dilated small bowel now. But gas is present to the rectum. No definite pneumoperitoneum on these supine views. No acute osseous abnormality identified. IMPRESSION: 1. Enteric tube has been pulled back, side hole now at the level of the GEJ. Recommend advancing 6 cm to ensure side hole placement inside the stomach. 2. Increasing gas-filled bowel throughout the abdomen and pelvis, most suggestive of Acute Ileus. Electronically Signed: By: Genevie Ann M.D. On: 09/16/2021 08:18   DG CHEST PORT 1 VIEW  Result Date: 09/16/2021 CLINICAL DATA:  81 year old male with history of chest congestion. EXAM: PORTABLE CHEST 1 VIEW COMPARISON:  Chest x-ray 09/16/2021. FINDINGS: An endotracheal tube is in place with tip 7.4 cm above the carina. A nasogastric tube is seen extending into the stomach, however, the tip of the nasogastric tube extends below the lower margin of the image. Lung volumes are normal. No consolidative airspace disease. No pleural effusions. No pneumothorax. No pulmonary nodule or mass noted.  Pulmonary vasculature and the cardiomediastinal  silhouette are within normal limits. 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 IMPRESSION: 1. Support apparatus, as above. 2. No radiographic evidence of acute cardiopulmonary disease. 3. Aortic atherosclerosis. Electronically Signed   By: Vinnie Langton M.D.   On: 09/16/2021 08:12   DG Chest Port 1 View  Result Date: 09/16/2021 CLINICAL DATA:  81 year old male with history of stroke. EXAM: PORTABLE CHEST 1 VIEW COMPARISON:  Chest x-ray 09/15/2021. FINDINGS: An endotracheal tube is in place with tip 7.0 cm above the carina. A nasogastric tube is seen extending into the stomach, however, the tip of the nasogastric tube extends below the lower margin of the image. Lung volumes are normal. No consolidative airspace disease. No pleural effusions. No pneumothorax. No evidence of pulmonary edema. Heart size is mildly enlarged. Upper mediastinal contours are within normal limits. Atherosclerotic calcifications are noted in the thoracic aorta. Status post median sternotomy for aortic valve replacement (a stented bioprosthesis is noted). IMPRESSION: 1. Postoperative changes and support apparatus, as above. 2. No radiographic evidence of acute cardiopulmonary disease. 3. Mild cardiomegaly. 4. Aortic atherosclerosis. Electronically Signed   By: Vinnie Langton M.D.   On: 09/16/2021 05:50   MR ANGIO HEAD WO CONTRAST  Result Date: 09/16/2021 CLINICAL DATA:  81 year old male with paroxysmal atrial fibrillation on Eliquis. New onset aphasia, right side weakness. Code stroke presentation yesterday. Left M1 occlusion by CTA. EXAM: MRA HEAD WITHOUT CONTRAST TECHNIQUE: Angiographic images of the Circle of Willis were acquired using MRA technique without intravenous contrast. COMPARISON:  MRI brain today reported separately. CTA head and neck yesterday. FINDINGS: Anterior circulation:  Antegrade flow in both ICA siphons with bilateral siphon irregularity in keeping with the atherosclerosis and stenosis better demonstrated by CTA. Carotid termini remain patent. And now the left MCA M1 segment, anterior temporal artery origin, and left MCA bifurcation are patent. Left MCA branches appear not significantly changed from yesterday, including a long segment M2 stenosis demonstrated in the posterior division on series 1035, image 4. Right MCA M1 segment is patent but with moderate to severe stenosis upstream of the right MCA bifurcation, concordant with CTA appearance yesterday. Likewise, right MCA branches appear stable with mild irregularity. Anterior communicating artery and visible ACA branches are stable, mild right A2 stenosis. Posterior circulation: Antegrade flow in the dominant distal left vertebral artery with patent left PICA origin. No distal left vertebral stenosis. Asymmetrically decreased flow in the smaller distal right vertebral artery, which did appear atherosclerotic and stenotic on the CTA yesterday but was patent at that time to the vertebrobasilar junction. Right PICA origin appears to remain patent. Patent basilar artery without stenosis. Patent SCA and PCA origins. Bilateral PCA branches appear stable, within normal limits. Anatomic variants: Dominant left vertebral V4 segment. Other: Brain MRI reported separately today. IMPRESSION: 1. Recanalized Left MCA M1 segment. Patent left MCA bifurcation. Upstream ICA atherosclerosis and stenosis, and downstream MCA M2 branch stenosis appears unchanged from the CTA yesterday. 2. Elsewhere stable intracranial atherosclerosis and stenosis, most notably both ICA siphons, Right MCA bifurcation, distal Right Vertebral Artery. Electronically Signed   By: Genevie Ann M.D.   On: 09/16/2021 05:49   MR BRAIN WO CONTRAST  Result Date: 09/16/2021 CLINICAL DATA:  81 year old male with paroxysmal atrial fibrillation on Eliquis. New onset aphasia, right  side weakness. Code stroke presentation yesterday. Left M1 occlusion. EXAM: MRI HEAD WITHOUT CONTRAST TECHNIQUE: Multiplanar, multiecho pulse sequences of the brain and surrounding structures were obtained without intravenous  contrast. COMPARISON:  CT head and CTA head and neck yesterday. FINDINGS: Brain: Patchy restricted diffusion throughout the left basal ganglia including the caudate and the lentiform. There is some involvement of the posterior limb of the left external capsule. And there is some mesial temporal lobe involvement on series 5, image 73. But otherwise Insula and other left MCA territory cortex seems spared. No contralateral right hemisphere or posterior fossa restricted diffusion. Multiple chronic generally small cerebellar infarcts on the right. Patchy and confluent bilateral cerebral white matter T2 and FLAIR hyperintensity with a small area of chronic left middle frontal gyrus encephalomalacia (series 10, image 19). There are several chronic microhemorrhages also in the left frontal lobe (series 22, image 35). No midline shift, mass effect, evidence of mass lesion, ventriculomegaly, extra-axial collection or acute intracranial hemorrhage. Cervicomedullary junction and pituitary are within normal limits. Vascular: Major intracranial vascular flow voids are preserved except for the distal right vertebral artery, which was non dominant but patent by CTA yesterday. Skull and upper cervical spine: Negative. Visualized bone marrow signal is within normal limits. Sinuses/Orbits: Postoperative changes to both globes. Paranasal Visualized paranasal sinuses and mastoids are stable and well aerated. Other: Intubated. Small volume fluid in the pharynx and nasal cavity. Grossly normal visible internal auditory structures. IMPRESSION: 1. Patchy acute infarct in the left basal ganglia and the left mesial temporal lobe with no associated hemorrhage or mass effect. 2. Underlying chronic small and medium-sized  vessel ischemia. 3. MRA Head reported separately today. Electronically Signed   By: Genevie Ann M.D.   On: 09/16/2021 05:23   DG Abd Portable 1V  Result Date: 09/15/2021 CLINICAL DATA:  OG tube placement. EXAM: PORTABLE ABDOMEN - 1 VIEW COMPARISON:  None Available. FINDINGS: Tip and side port of the enteric tube below the diaphragm in the stomach. Enteric chain sutures in the left upper quadrant as well as multiple surgical clips. Surgical clips in the right upper quadrant. No bowel dilatation or evidence of obstruction in the included upper abdomen. IMPRESSION: Tip and side port of the enteric tube below the diaphragm in the stomach. Electronically Signed   By: Keith Rake M.D.   On: 09/15/2021 21:23   DG CHEST PORT 1 VIEW  Result Date: 09/15/2021 CLINICAL DATA:  Intubation. EXAM: PORTABLE CHEST 1 VIEW COMPARISON:  10/02/2020 FINDINGS: Endotracheal tube tip 5.4 cm from the carina at the level of the clavicular heads. Enteric tube in place with tip below the diaphragm not included on this chest field of view. Prior median sternotomy prosthetic aortic valve. Cardiomegaly. Hazy opacity at the left greater than right lung base likely represents combination of atelectasis and pleural effusions. Vascular congestion. No pneumothorax. IMPRESSION: 1. Endotracheal tube tip 5.4 cm from the carina at the level of the clavicular heads. Enteric tube in place with tip below the diaphragm. 2. Cardiomegaly. Vascular congestion. Hazy bibasilar lung opacities likely combination of pleural effusions and atelectasis. Suspect CHF. Electronically Signed   By: Keith Rake M.D.   On: 09/15/2021 21:21   CT ANGIO HEAD NECK W WO CM (CODE STROKE)  Result Date: 09/15/2021 CLINICAL DATA:  Provided history: Neuro deficit, acute, stroke suspected; right-sided weakness and facial droop. EXAM: CT ANGIOGRAPHY HEAD AND NECK TECHNIQUE: Multidetector CT imaging of the head and neck was performed using the standard protocol during bolus  administration of intravenous contrast. Multiplanar CT image reconstructions and MIPs were obtained to evaluate the vascular anatomy. Carotid stenosis measurements (when applicable) are obtained utilizing NASCET criteria, using the distal  internal carotid diameter as the denominator. RADIATION DOSE REDUCTION: This exam was performed according to the departmental dose-optimization program which includes automated exposure control, adjustment of the mA and/or kV according to patient size and/or use of iterative reconstruction technique. CONTRAST:  37m OMNIPAQUE IOHEXOL 350 MG/ML SOLN COMPARISON:  Noncontrast head CT performed immediately prior 09/15/2021. FINDINGS: CTA NECK FINDINGS Aortic arch: Standard aortic branching. Atherosclerotic plaque within the visualized aortic arch and proximal major branch vessels of the neck. No hemodynamically significant innominate or proximal subclavian artery stenosis. Right carotid system: CCA and ICA patent within the neck. Mild scattered atherosclerotic plaque within the proximal to mid CCA. Prominent predominantly calcified plaque about the carotid bifurcation and within the proximal ICA. Resultant in 50-60% stenosis at the origin of the right ICA. Left carotid system: CCA and ICA patent within the neck. Mild atherosclerotic plaque scattered within the proximal to mid CCA. Prominent atherosclerotic plaque about the carotid bifurcation and within the proximal ICA. Resultant 40% stenosis of the proximal ICA. Vertebral arteries: Vertebral arteries patent within the neck. The left vertebral artery is dominant. Severe atherosclerotic stenosis at the origin of the right vertebral artery. Mild atherosclerotic narrowing at the origin of the left vertebral artery. Skeleton: Cervical spondylosis. No acute fracture or aggressive osseous lesion. Other neck: No neck mass or cervical lymphadenopathy. Upper chest: Prior median sternotomy. Smooth interlobular septal thickening within the  imaged lung apices, likely reflecting interstitial edema. Partially imaged right pleural effusion. Review of the MIP images confirms the above findings CTA HEAD FINDINGS Anterior circulation: The intracranial internal carotid arteries are patent. Atherosclerotic plaque within both vessels. Most notably, there is moderate atherosclerotic narrowing of the distal cavernous/paraclinoid right ICA, and severe stenosis of the distal cavernous/paraclinoid left ICA. The left M1 MCA segment is occluded at its origin. There is some reconstitution of enhancement within M2 and more distal left MCA vessels. The right M1 segment is patent. Atherosclerotic irregularity of the M2 and more distal right MCA vessels. No appreciable right M2 proximal branch occlusion or high-grade proximal stenosis. The anterior cerebral arteries are patent. Posterior circulation: The intracranial vertebral arteries are patent. Nonstenotic atherosclerotic plaque within the non dominant V4 right vertebral artery. The basilar artery is patent. The posterior cerebral arteries are patent. Atherosclerotic irregularity of both vessels without high-grade proximal stenosis. Posterior communicating arteries are diminutive or absent, bilaterally. Venous sinuses: Within the limitations of contrast timing, no convincing thrombus. Anatomic variants: As described. Review of the MIP images confirms the above findings CTA head impression #1 called by telephone at the time of interpretation on 09/15/2021 at 3:50 pm to provider HIsla Pence who verbally acknowledged these results. IMPRESSION: CTA neck: 1. The common carotid, internal carotid and vertebral arteries are patent within the neck. 2. Atherosclerotic narrowing at the origin of the right ICA of 50-60%. 3. Atherosclerotic narrowing at the origin of the left ICA of 40%. 4. Severe stenosis at the origin of the non-dominant right vertebral artery. 5. Mild atherosclerotic narrowing at the origin of the dominant  left vertebral artery. 6.  Aortic Atherosclerosis (ICD10-I70.0). 7. Interstitial edema within the imaged lung apices. 8. Partially imaged right pleural effusion. CTA head: 1. The M1 segment of the left middle cerebral artery is occluded at its origin. There is some reconstitution of enhancement within M2 and more distal left MCA vessels, likely due to collateral flow. 2. 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 3. Severe stenosis of the distal cavernous/paraclinoid left ICA. 4. Moderate stenosis  of the distal cavernous/paraclinoid right ICA. Electronically Signed   By: Kellie Simmering D.O.   On: 09/15/2021 16:20   CT HEAD CODE STROKE WO CONTRAST  Result Date: 09/15/2021 CLINICAL DATA:  Code stroke. Neuro deficit, acute, stroke suspected. Right-sided weakness and facial droop. EXAM: CT HEAD WITHOUT CONTRAST TECHNIQUE: Contiguous axial images were obtained from the base of the skull through the vertex without intravenous contrast. RADIATION DOSE REDUCTION: This exam was performed according to the departmental dose-optimization program which includes automated exposure control, adjustment of the mA and/or kV according to patient size and/or use of iterative reconstruction technique. COMPARISON:  No pertinent prior exams available for comparison. FINDINGS: Brain: Mild generalized cerebral atrophy. Suspected subtle loss of gray-white differentiation within the left insula (for instance as seen on series 2, image 18). Small left MCA territory cortical/subcortical infarct within the left parietal lobe (involving the left postcentral gyrus). This is favored chronic. Background mild patchy and ill-defined hypoattenuation within the cerebral white matter, nonspecific but compatible chronic small vessel ischemic disease. This includes a chronic lacunar infarct within the right centrum semiovale/corona radiata. Small chronic infarcts within the right cerebellar  hemisphere. There is no acute intracranial hemorrhage. No extra-axial fluid collection. No evidence of an intracranial mass. No midline shift. Vascular: Hyperdense M1 left MCA segment, highly suspicious for presence of endoluminal thrombus. Skull: No fracture or aggressive osseous lesion. Sinuses/Orbits: No mass or acute finding within the imaged orbits. Small mucous retention cyst versus polyp within the left maxillary sinus. Minimal mucosal thickening within the right sphenoid sinus and scattered within the bilateral ethmoid sinuses. ASPECTS ([place] Stroke Program Early CT Score) - Ganglionic level infarction (caudate, lentiform nuclei, internal capsule, insula, M1-M3 cortex): 6 - Supraganglionic infarction (M4-M6 cortex): 3 Total score (0-10 with 10 being normal): 9 Critical/emergent results were called by telephone at the time of interpretation on 09/15/2021 at 3:45 pm to provider JULIE HAVILAND , who verbally acknowledged these results. IMPRESSION: 1. Hyperdense left M1 MCA segment highly suspicious for the presence of endoluminal thrombus. 2. Suspected subtle acute infarct involving the left insula. ASPECTS is 9. 3. Small left MCA territory cortically-based infarct within the left parietal lobe (with involvement of the postcentral gyrus), favored chronic. 4. Background mild chronic small ischemic changes within the cerebral white matter. This includes a chronic lacunar infarct within the right centrum semiovale/corona radiata. 5. 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 6. Mild generalized parenchymal atrophy. Electronically Signed   By: Kellie Simmering D.O.   On: 09/15/2021 15:50     PHYSICAL EXAM  Temp:  [97.6 F (36.4 C)-98.6 F (37 C)] 98.6 F (37 C) (06/20 1142) Pulse Rate:  [57-66] 66 (06/20 1142) Resp:  [18-25] 18 (06/20 1142) BP: (120-140)/(51-82) 121/77 (06/20 1142) SpO2:  [92 %-98 %] 95 % (06/20 1142)  General -frail elderly Caucasian male.  Not in  distress.  Ophthalmologic - fundi not visualized due to noncooperation.  Cardiovascular - irregularly irregular heart rate and rhythm.  Neuro -awake and alert and interactive able to follow all simple commands, no aphasia but paucity of speech, able to name and repeat in dysarthric voice. Eyes mid position, visual field full, no gaze palsy, PERRL. Mild right facial droop and tongue protrusion not complete. BUE 3/5 and slight drift bilaterally, b/l LE at least 3-/5 bilaterally symmetrical. DTR 1+ and no babinski. Sensation symmetrical, coordination b/l FTN grossly intact but slow on action, and gait not tested.    ASSESSMENT/PLAN [name] is a 81  y.o. male with history of CHF, A-fib on Eliquis CKD, hypertension, diabetes, CHF, PE in 2022, chronic DVT, aortic stenosis status post AVR admitted for aphasia, right-sided weakness, right facial droop. No tPA given due to on Eliquis.    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 IR with left M1 occlusion s/p TICI3 reperfusion MRI left BG, CR and mesial temporal lobe small infarcts MRA left M1 occlusion recannulized, patent left MCA.  Stable intracranial atherosclerosis and stenosis most notably both ICA siphon, right MCA bifurcation and distal right VA 2D Echo EF 20 to 25% LDL 66 HgbA1c 5.9 Heparin subcu for VTE prophylaxis Eliquis (apixaban) daily prior to admission, now on   Eliquis Ongoing aggressive stroke risk factor management Therapy recommendations: CIR Disposition: Pending  Respiratory failure Aspiration Intubated for procedure and airway protection Had a massive  aspiration 6/15  On Rocephin Extubated 6/17 and tolerating well CCM on board  Chronic A-fib On Eliquis at home Rate controlled Also on amiodarone and metoprolol Follow-up with Dr. Jac Canavan at [facility] now on heparin IV, transition to po once po access  Cardiomyopathy EF 20 to 25% Follows with Dr. Jac Canavan at [place] Not on significant heart failure regimen given hypotension and bradycardia. now on heparin IV, transition to po once po access  Ileus, improving Likely the cause of large vomitus with aspiration Bowel rest NPO now CCM on board  Diabetes HgbA1c 5.9 goal < 7.0 Controlled CBG monitoring SSI DM education and close PCP follow up  History of hypertension Hypotension BP on the low end On Levophed CCM on board Long term BP goal normotensive  Lipid management Home meds: None LDL 66, goal < 70 No statin for now given advanced age and LDL at goal as well as no po access  AKI on CKD CKD 3, creatinine 1.60-1.39->2.43->1.54 CCM on board Avoid nephrotoxic meds  Other Stroke Risk Factors Advanced age PE in 2022 Chronic DVT  Other Active Problems AS s/p bioprosthetic aortic valve    Hospital day # 6 Plan mobilize out of bed.  Continue ongoing therapies.  Transfer to rehab when bed available..  Discussed with patient, family, and answered questions.  Greater than 50% time during this 35-minute visit was spent in counseling and coordination of care and discussion patient and family and care team and answering questions.        Antony Contras, MD Stroke Neurology 09/21/2021 1:56 PM    To contact Stroke Continuity provider, please refer to [email or web address] After hours, contact General Neurology

## 2021-09-21 NOTE — Care Management Important Message (Signed)
Important Message  Patient Details  Name: Eugene Campbell MRN: [id number] Date of Birth: 1940/12/03   Medicare Important Message Given:  Yes     [name] 09/21/2021, 4:00 PM

## 2021-09-21 NOTE — Progress Notes (Signed)
  Inpatient Rehabilitation Admissions Coordinator   Met with patient and daughter, [name], at bedside for rehab assessment. We discussed goals and expectations of a possible CIR admit. They prefer CIR for rehab. Family can provide expected caregiver support that is recommended . 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  I will begin insurance Auth with [facility] once I get updated therapy notes today for possible CIR admit pending approval. Please call me with any questions.   Danne Baxter, RN, MSN Rehab Admissions Coordinator [phone]

## 2021-09-21 NOTE — Progress Notes (Signed)
Physical Therapy Treatment Patient Details Name: Eugene Campbell MRN: [id number] DOB: [birth year] Today's Date: 09/21/2021   History of Present Illness Pt is an 81 y/o male who presented with sudden onset of R sided weakness, facial droop and aphasia. 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 Pt remained intubated after procedure with complications due to acute hypoxic respiratory failure and massive aspiration. Pt extubated 6/17. PMH: a fib, HTN, PT and DVT, clotting disorder, DM2    PT Comments    Pt progressing well towards all goals. Pt able to tolerate 2 bouts of ambulation with RW. Pt with short, shuffled step with significant decreased pace. Pt remains to have impaired balance and is at high fall risk. Pt was indep PTA and would greatly benefit from AIR upon d/c to maximize functional recovery and progress back to indep function. Acute PT to cont to follow.    Recommendations for follow up therapy are one component of a multi-disciplinary discharge planning process, led by the attending physician.  Recommendations may be updated based on patient status, additional functional criteria and insurance authorization.  Follow Up Recommendations  Acute inpatient rehab (3hours/day)     Assistance Recommended at Discharge Frequent or constant Supervision/Assistance  Patient can return home with the following A little help with walking and/or transfers;A little help with bathing/dressing/bathroom;Assistance with cooking/housework;Assist for transportation;Help with stairs or ramp for entrance   Equipment Recommendations   (TBA)    Recommendations for Other Services Rehab consult     Precautions / Restrictions Precautions Precautions: Fall Precaution Comments: monitor O2 Restrictions Weight Bearing Restrictions: No     Mobility  Bed Mobility Overal bed mobility: Needs Assistance Bed  Mobility: Sit to Supine       Sit to supine: Min guard   General bed mobility comments: pt initiated return to supine however demo'd decreased insight to safety with lack of awareness of catheter and chair being in the way    Transfers Overall transfer level: Needs assistance Equipment used: Rolling walker (2 wheels) Transfers: Sit to/from Stand Sit to Stand: Min assist           General transfer comment: minA to power up, increased time    Ambulation/Gait Ambulation/Gait assistance: Mod assist, +2 safety/equipment Gait Distance (Feet): 30 Feet (x2) Assistive device: Rolling walker (2 wheels) Gait Pattern/deviations: Step-through pattern Gait velocity: slow Gait velocity interpretation: <1.8 ft/sec, indicate of risk for recurrent falls   General Gait Details: pt with trunk flexion, difficulty maintaining upright posture, modA to maintain forward momentum of RW, verbal cues to increase step length as pt with short shuffled steps   Stairs             Wheelchair Mobility    Modified Rankin (Stroke Patients Only) Modified Rankin (Stroke Patients Only) Pre-Morbid Rankin Score: No symptoms Modified Rankin: Moderately severe disability     Balance Overall balance assessment: Needs assistance Sitting-balance support: Feet supported, No upper extremity supported Sitting balance-Leahy Scale: Fair     Standing balance support: During functional activity, Bilateral upper extremity supported Standing balance-Leahy Scale: Fair Standing balance comment: external support and UE's on AD                            Cognition Arousal/Alertness: Awake/alert Behavior During Therapy: Impulsive Overall Cognitive Status: Impaired/Different from baseline Area of Impairment: Following commands, Safety/judgement, Awareness, Problem solving  Following Commands: Follows one step commands consistently, Follows multi-step commands  inconsistently Safety/Judgement: Decreased awareness of safety, Decreased awareness of deficits Awareness: Emergent Problem Solving: Slow processing, Requires verbal cues, Requires tactile cues General Comments: pt continues to initiation movement with some impulsivity however suspect this to pt's independent mentality        Exercises      General Comments General comments (skin integrity, edema, etc.): SPO2 at 84%on RA during amb, 93% on 2Lo2 via [place] during amb      Pertinent Vitals/Pain Pain Assessment Pain Assessment: No/denies pain    Home Living                          Prior Function            PT Goals (current goals can now be found in the care plan section) Acute Rehab PT Goals PT Goal Formulation: With patient/family Time For Goal Achievement: 10/01/21 Potential to Achieve Goals: Good Progress towards PT goals: Progressing toward goals    Frequency    Min 3X/week      PT Plan Current plan remains appropriate    Co-evaluation              AM-PAC PT "6 Clicks" Mobility   Outcome Measure  Help needed turning from your back to your side while in a flat bed without using bedrails?: A Little Help needed moving from lying on your back to sitting on the side of a flat bed without using bedrails?: A Little Help needed moving to and from a bed to a chair (including a wheelchair)?: A Lot Help needed standing up from a chair using your arms (e.g., wheelchair or bedside chair)?: A Little Help needed to walk in hospital room?: A Lot Help needed climbing 3-5 steps with a railing? : Total 6 Click Score: 14    End of Session Equipment Utilized During Treatment: Oxygen Activity Tolerance: Patient limited by fatigue;Patient tolerated treatment well Patient left: in chair;with call bell/phone within reach;with family/visitor present;with bed alarm set Nurse Communication: Mobility status PT Visit Diagnosis: Other abnormalities of gait and mobility  (R26.89);Difficulty in walking, not elsewhere classified (R26.2);Other symptoms and signs involving the nervous system (R29.898)     Time: 1093-2355 PT Time Calculation (min) (ACUTE ONLY): 28 min  Charges:  $Gait Training: 23-37 mins                     Lewis Shock, PT, DPT Acute Rehabilitation Services Secure chat preferred Office #: [phone]    Iona Hansen 09/21/2021, 2:08 PM

## 2021-09-21 NOTE — Care Management Important Message (Signed)
Important Message  Patient Details  Name: Eugene Campbell MRN: [id number] Date of Birth: Jun 19, 1940   Medicare Important Message Given:  Yes     Dorena Bodo 09/21/2021, 4:03 PM

## 2021-09-21 NOTE — Progress Notes (Signed)
Occupational Therapy Treatment Patient Details Name: Eugene Campbell MRN: [id number] DOB: January 12, 1941 Today's Date: 09/21/2021   History of present illness Pt is an 81 y/o male who presented with sudden onset of R sided weakness, facial droop and aphasia. 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 Pt remained intubated after procedure with complications due to acute hypoxic respiratory failure and massive aspiration. Pt extubated 6/17. PMH: a fib, HTN, PT and DVT, clotting disorder, DM2   OT comments  Pt seen for first OT session since extubation and transfer out of ICU. Pt making excellent progress towards OT goals, able to mobilize in room and to sink for ADLs using RW at [place]. Pt eager to stand for duration of oral care task > 5-7 min though noted to fatigue and require increased cues for postural correction. Pt follows directions consistently though benefits from increased time to respond/initiate. Plan to progress bathroom mobility and LB ADLs in next session. Pt's daughter present and supportive. Continue to feel pt will quickly return to PLOF with AIR level therapies.    Recommendations for follow up therapy are one component of a multi-disciplinary discharge planning process, led by the attending physician.  Recommendations may be updated based on patient status, additional functional criteria and insurance authorization.    Follow Up Recommendations  Acute inpatient rehab (3hours/day)    Assistance Recommended at Discharge Intermittent Supervision/Assistance  Patient can return home with the following  A little help with walking and/or transfers;A little help with bathing/dressing/bathroom;Assistance with cooking/housework;Assist for transportation;Help with stairs or ramp for entrance   Equipment Recommendations  Other (comment) (RW)    Recommendations for Other Services Rehab  consult    Precautions / Restrictions Precautions Precautions: Fall;Other (comment) Precaution Comments: monitor O2 Restrictions Weight Bearing Restrictions: No       Mobility Bed Mobility Overal bed mobility: Needs Assistance Bed Mobility: Supine to Sit     Supine to sit: Min assist     General bed mobility comments: able to bring LEs off of bed well, noted posterior bias requiring Mod A to correct and lift trunk to EOB    Transfers Overall transfer level: Needs assistance Equipment used: Rolling walker (2 wheels) Transfers: Sit to/from Stand Sit to Stand: Min assist           General transfer comment: Min A to steady and achieve full upright posture     Balance Overall balance assessment: Needs assistance Sitting-balance support: Feet supported, No upper extremity supported Sitting balance-Leahy Scale: Fair Sitting balance - Comments: posterior LOB with dynamic tasks Postural control: Posterior lean Standing balance support: During functional activity, Bilateral upper extremity supported Standing balance-Leahy Scale: Poor                             ADL either performed or assessed with clinical judgement   ADL Overall ADL's : Needs assistance/impaired     Grooming: Minimal assistance;Standing;Oral care;Wash/dry face Grooming Details (indicate cue type and reason): leaning forward on sink significantly to complete denture care. noted fatigue though pt declined need to sit down. assist to open packaging and place toothpaste on toothbrush due to poor bimanual manipulation/balance deficits. able to brush hair thoroughly while seated in recliner at end of session  Functional mobility during ADLs: Minimal assistance;Rolling walker (2 wheels);Cueing for sequencing;Cueing for safety General ADL Comments: Emphasis on standing tolerance and coordination with ADLs at sink, as well as mobility in room with RW.     Extremity/Trunk Assessment Upper Extremity Assessment Upper Extremity Assessment: RUE deficits/detail RUE Deficits / Details: 4-/5 shoulder mildly weaker than L side. 4+/5 biceps/triceps/grip strength RUE Coordination: decreased fine motor   Lower Extremity Assessment Lower Extremity Assessment: Defer to PT evaluation        Vision   Vision Assessment?: No apparent visual deficits   Perception     Praxis      Cognition Arousal/Alertness: Awake/alert Behavior During Therapy: WFL for tasks assessed/performed, Impulsive Overall Cognitive Status: Impaired/Different from baseline Area of Impairment: Following commands, Safety/judgement, Awareness, Problem solving                       Following Commands: Follows one step commands consistently Safety/Judgement: Decreased awareness of safety, Decreased awareness of deficits Awareness: Emergent Problem Solving: Slow processing, Requires verbal cues, Requires tactile cues General Comments: slower processing, pleasant and follows commands consistently. HOH that also requires repetition of instructions        Exercises      Shoulder Instructions       General Comments Daughter, Eugene Campbell, present and supportive    Pertinent Vitals/ Pain       Pain Assessment Pain Assessment: No/denies pain  Home Living                                          Prior Functioning/Environment              Frequency  Min 2X/week        Progress Toward Goals  OT Goals(current goals can now be found in the care plan section)  Progress towards OT goals: Progressing toward goals  Acute Rehab OT Goals Patient Stated Goal: regain independence, go home OT Goal Formulation: With patient/family Time For Goal Achievement: 10/01/21 Potential to Achieve Goals: Fair ADL Goals Pt Will Perform Lower Body Dressing: with set-up;sit to/from stand;sitting/lateral leans Pt Will Transfer to Toilet: with set-up;stand  pivot transfer;bedside commode Pt Will Perform Toileting - Clothing Manipulation and hygiene: with supervision;sit to/from stand;sitting/lateral leans  Plan Discharge plan remains appropriate    Co-evaluation                 AM-PAC OT "6 Clicks" Daily Activity     Outcome Measure   Help from another person eating meals?: A Little Help from another person taking care of personal grooming?: A Little Help from another person toileting, which includes using toliet, bedpan, or urinal?: A Lot Help from another person bathing (including washing, rinsing, drying)?: A Lot Help from another person to put on and taking off regular upper body clothing?: A Little Help from another person to put on and taking off regular lower body clothing?: A Lot 6 Click Score: 15    End of Session Equipment Utilized During Treatment: Gait belt;Rolling walker (2 wheels);Oxygen  OT Visit Diagnosis: Other abnormalities of gait and mobility (R26.89);Muscle weakness (generalized) (M62.81)   Activity Tolerance Patient tolerated treatment well   Patient Left in chair;with call bell/phone within reach;with chair alarm set;with family/visitor present   Nurse Communication          Time: 3244-0102 OT Time Calculation (min): 28 min  Charges:  OT General Charges $OT Visit: 1 Visit OT Treatments $Self Care/Home Management : 23-37 mins  Bradd Canary, OTR/L Acute Rehab Services Office: [phone]   Lorre Munroe 09/21/2021, 1:05 PM

## 2021-09-22 ENCOUNTER — Other Ambulatory Visit: Payer: Self-pay

## 2021-09-22 ENCOUNTER — Encounter (HOSPITAL_COMMUNITY): Payer: Self-pay | Admitting: Physical Medicine & Rehabilitation

## 2021-09-22 ENCOUNTER — Inpatient Hospital Stay (HOSPITAL_COMMUNITY)
Admission: RE | Admit: 2021-09-22 | Discharge: 2021-10-01 | DRG: 057 | Disposition: A | Discharge status: Home / Self Care | Payer: Medicare HMO | Source: Intra-hospital | Attending: Physical Medicine & Rehabilitation | Admitting: Physical Medicine & Rehabilitation

## 2021-09-22 DIAGNOSIS — R040 Epistaxis: Secondary | ICD-10-CM | POA: Diagnosis not present

## 2021-09-22 DIAGNOSIS — I69320 Aphasia following cerebral infarction: Secondary | ICD-10-CM | POA: Diagnosis not present

## 2021-09-22 DIAGNOSIS — D696 Thrombocytopenia, unspecified: Secondary | ICD-10-CM | POA: Diagnosis present

## 2021-09-22 DIAGNOSIS — Z79890 Hormone replacement therapy (postmenopausal): Secondary | ICD-10-CM | POA: Diagnosis not present

## 2021-09-22 DIAGNOSIS — Z79899 Other long term (current) drug therapy: Secondary | ICD-10-CM | POA: Diagnosis not present

## 2021-09-22 DIAGNOSIS — Z87448 Personal history of other diseases of urinary system: Secondary | ICD-10-CM

## 2021-09-22 DIAGNOSIS — T50905A Adverse effect of unspecified drugs, medicaments and biological substances, initial encounter: Secondary | ICD-10-CM | POA: Diagnosis present

## 2021-09-22 DIAGNOSIS — E785 Hyperlipidemia, unspecified: Secondary | ICD-10-CM | POA: Diagnosis present

## 2021-09-22 DIAGNOSIS — N183 Chronic kidney disease, stage 3 unspecified: Secondary | ICD-10-CM

## 2021-09-22 DIAGNOSIS — I429 Cardiomyopathy, unspecified: Secondary | ICD-10-CM | POA: Diagnosis present

## 2021-09-22 DIAGNOSIS — I63512 Cerebral infarction due to unspecified occlusion or stenosis of left middle cerebral artery: Secondary | ICD-10-CM | POA: Diagnosis not present

## 2021-09-22 DIAGNOSIS — Z880 Allergy status to penicillin: Secondary | ICD-10-CM

## 2021-09-22 DIAGNOSIS — R1312 Dysphagia, oropharyngeal phase: Secondary | ICD-10-CM | POA: Diagnosis present

## 2021-09-22 DIAGNOSIS — R32 Unspecified urinary incontinence: Secondary | ICD-10-CM | POA: Diagnosis present

## 2021-09-22 DIAGNOSIS — Z953 Presence of xenogenic heart valve: Secondary | ICD-10-CM

## 2021-09-22 DIAGNOSIS — Z86711 Personal history of pulmonary embolism: Secondary | ICD-10-CM

## 2021-09-22 DIAGNOSIS — J690 Pneumonitis due to inhalation of food and vomit: Secondary | ICD-10-CM | POA: Diagnosis not present

## 2021-09-22 DIAGNOSIS — E039 Hypothyroidism, unspecified: Secondary | ICD-10-CM | POA: Diagnosis present

## 2021-09-22 DIAGNOSIS — Z7902 Long term (current) use of antithrombotics/antiplatelets: Secondary | ICD-10-CM

## 2021-09-22 DIAGNOSIS — I482 Chronic atrial fibrillation, unspecified: Secondary | ICD-10-CM | POA: Diagnosis present

## 2021-09-22 DIAGNOSIS — Z87891 Personal history of nicotine dependence: Secondary | ICD-10-CM

## 2021-09-22 DIAGNOSIS — Z7901 Long term (current) use of anticoagulants: Secondary | ICD-10-CM

## 2021-09-22 DIAGNOSIS — E1122 Type 2 diabetes mellitus with diabetic chronic kidney disease: Secondary | ICD-10-CM | POA: Diagnosis present

## 2021-09-22 DIAGNOSIS — Z23 Encounter for immunization: Secondary | ICD-10-CM

## 2021-09-22 DIAGNOSIS — J90 Pleural effusion, not elsewhere classified: Secondary | ICD-10-CM | POA: Diagnosis not present

## 2021-09-22 DIAGNOSIS — I5022 Chronic systolic (congestive) heart failure: Secondary | ICD-10-CM | POA: Diagnosis present

## 2021-09-22 DIAGNOSIS — I69351 Hemiplegia and hemiparesis following cerebral infarction affecting right dominant side: Principal | ICD-10-CM

## 2021-09-22 DIAGNOSIS — K5903 Drug induced constipation: Secondary | ICD-10-CM

## 2021-09-22 DIAGNOSIS — I639 Cerebral infarction, unspecified: Secondary | ICD-10-CM | POA: Diagnosis not present

## 2021-09-22 DIAGNOSIS — Z8701 Personal history of pneumonia (recurrent): Secondary | ICD-10-CM

## 2021-09-22 LAB — GLUCOSE, CAPILLARY
Glucose-Capillary: 195 mg/dL — ABNORMAL HIGH (ref 70–99)
Glucose-Capillary: 162 mg/dL — ABNORMAL HIGH (ref 70–99)
Glucose-Capillary: 107 mg/dL — ABNORMAL HIGH (ref 70–99)
Glucose-Capillary: 175 mg/dL — ABNORMAL HIGH (ref 70–99)

## 2021-09-22 MED ORDER — PROCHLORPERAZINE MALEATE 5 MG PO TABS: 5.0000 mg | ORAL_TABLET | Freq: Four times a day (QID) | ORAL | Status: DC | PRN

## 2021-09-22 MED ORDER — TRAZODONE HCL 50 MG PO TABS: 25.0000 mg | ORAL_TABLET | Freq: Every evening | ORAL | Status: DC | PRN

## 2021-09-22 MED ORDER — GUAIFENESIN-DM 100-10 MG/5ML PO SYRP: 5.0000 mL | ORAL_SOLUTION | Freq: Four times a day (QID) | ORAL | Status: DC | PRN

## 2021-09-22 MED ORDER — MAGNESIUM HYDROXIDE 400 MG/5ML PO SUSP
30.0000 mL | Freq: Every day | ORAL | Status: DC | PRN
Start: 2021-09-22 — End: 2021-10-01

## 2021-09-22 MED ORDER — ACETAMINOPHEN 325 MG PO TABS
325.0000 mg | ORAL_TABLET | ORAL | Status: DC | PRN
  Administered 2021-09-25: 650 mg via ORAL
  Filled 2021-09-22: qty 2

## 2021-09-22 MED ORDER — LEVOTHYROXINE SODIUM 75 MCG PO TABS
75.0000 ug | ORAL_TABLET | Freq: Every day | ORAL | Status: DC
  Administered 2021-09-23 – 2021-10-01 (×9): 75 ug via ORAL
  Filled 2021-09-22 (×9): qty 1

## 2021-09-22 MED ORDER — METHOCARBAMOL 500 MG PO TABS
500.0000 mg | ORAL_TABLET | Freq: Four times a day (QID) | ORAL | Status: DC | PRN
  Administered 2021-09-25 (×2): 500 mg via ORAL
  Filled 2021-09-22: qty 1

## 2021-09-22 MED ORDER — PROCHLORPERAZINE 25 MG RE SUPP: 12.5000 mg | Freq: Four times a day (QID) | RECTAL | Status: DC | PRN

## 2021-09-22 MED ORDER — ATORVASTATIN CALCIUM 40 MG PO TABS
40.0000 mg | ORAL_TABLET | Freq: Every day | ORAL | Status: DC
  Administered 2021-09-23 – 2021-10-01 (×9): 40 mg via ORAL
  Filled 2021-09-22 (×9): qty 1

## 2021-09-22 MED ORDER — POLYETHYLENE GLYCOL 3350 17 G PO PACK
17.0000 g | PACK | Freq: Every day | ORAL | Status: DC
  Administered 2021-09-23 – 2021-10-01 (×7): 17 g via ORAL
  Filled 2021-09-22 (×9): qty 1

## 2021-09-22 MED ORDER — BISACODYL 5 MG PO TBEC: 5.0000 mg | DELAYED_RELEASE_TABLET | Freq: Every day | ORAL | Status: DC | PRN

## 2021-09-22 MED ORDER — ALUM & MAG HYDROXIDE-SIMETH 200-200-20 MG/5ML PO SUSP: 30.0000 mL | ORAL | Status: DC | PRN

## 2021-09-22 MED ORDER — INSULIN ASPART 100 UNIT/ML IJ SOLN: 0.0000 [IU] | Freq: Every day | INTRAMUSCULAR | Status: DC

## 2021-09-22 MED ORDER — ADULT MULTIVITAMIN W/MINERALS CH
1.0000 | ORAL_TABLET | Freq: Every day | ORAL | Status: DC
  Administered 2021-09-23 – 2021-10-01 (×9): 1 via ORAL
  Filled 2021-09-22 (×9): qty 1

## 2021-09-22 MED ORDER — ATORVASTATIN CALCIUM 40 MG PO TABS: 40.0000 mg | ORAL_TABLET | Freq: Every day | ORAL | 1 refills | Status: DC

## 2021-09-22 MED ORDER — PROCHLORPERAZINE EDISYLATE 10 MG/2ML IJ SOLN: 5.0000 mg | Freq: Four times a day (QID) | INTRAMUSCULAR | Status: DC | PRN

## 2021-09-22 MED ORDER — INSULIN ASPART 100 UNIT/ML IJ SOLN: 0.0000 [IU] | Freq: Three times a day (TID) | INTRAMUSCULAR | Status: DC

## 2021-09-22 MED ORDER — CARVEDILOL 3.125 MG PO TABS
3.1250 mg | ORAL_TABLET | Freq: Two times a day (BID) | ORAL | Status: DC
  Administered 2021-09-23 – 2021-10-01 (×15): 3.125 mg via ORAL
  Filled 2021-09-22 (×17): qty 1

## 2021-09-22 MED ORDER — FLEET ENEMA 7-19 GM/118ML RE ENEM: 1.0000 | ENEMA | Freq: Once | RECTAL | Status: DC | PRN

## 2021-09-22 MED ORDER — SENNOSIDES-DOCUSATE SODIUM 8.6-50 MG PO TABS
1.0000 | ORAL_TABLET | Freq: Two times a day (BID) | ORAL | Status: DC
Start: 2021-09-22 — End: 2021-10-01
  Administered 2021-09-22 – 2021-10-01 (×16): 1 via ORAL
  Filled 2021-09-22 (×20): qty 1

## 2021-09-22 MED ORDER — CARVEDILOL 3.125 MG PO TABS: 3.1250 mg | ORAL_TABLET | Freq: Two times a day (BID) | ORAL | 1 refills | Status: DC

## 2021-09-22 MED ORDER — APIXABAN 5 MG PO TABS
5.0000 mg | ORAL_TABLET | Freq: Two times a day (BID) | ORAL | Status: DC
  Administered 2021-09-22 – 2021-10-01 (×18): 5 mg via ORAL
  Filled 2021-09-22 (×20): qty 1

## 2021-09-22 NOTE — Discharge Summary (Addendum)
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 cardiomyopathy with low EF on Eliquis Principal Problem:   Acute ischemic stroke [facility]) Active Problems:   Acute respiratory failure with hypoxia ([place])   Longstanding persistent atrial fibrillation ([place])   Aspiration pneumonia of both lungs due to gastric secretions ([place])   Protein-calorie malnutrition, severe   Pressure injury of skin   Medications to be continued on Rehab Allergies as of 09/22/2021       Reactions   Penicillins Hives, Swelling, Rash, Other (See Comments)   Peeling skin and caused the gums to peel/crack        Medication List     TAKE these medications    atorvastatin 40 MG tablet Commonly known as: LIPITOR Take 1 tablet (40 mg total) by mouth daily. Start taking on: September 23, 2021   carvedilol 3.125 MG tablet Commonly known as: COREG Take 1 tablet (3.125 mg total) by mouth 2 (two) times daily with a meal.   COQ-10 PO Take 1 capsule by mouth daily.   Eliquis 5 MG Tabs tablet Generic drug: apixaban Take 5 mg by mouth 2 (two) times daily.   ferrous sulfate 325 (65 FE) MG tablet Take 325 mg by mouth daily with breakfast.   furosemide 40 MG tablet Commonly known as: LASIX Take 40 mg by mouth in the morning.   levothyroxine 75 MCG tablet Commonly known as: SYNTHROID Take 75 mcg by mouth See admin instructions. Take 75 mcg by mouth before breakfast on Mon/Tues/Wed/Thurs/Fri   Vitamin B Complex Tabs Take 1 tablet by mouth daily with breakfast.   Vitamin B-12 1000 MCG Subl Place 1,000 mcg  under the tongue daily.   Vitamin D3 25 MCG (1000 UT) Caps Take 1,000 Units by mouth daily.        LABORATORY STUDIES CBC    Component Value Date/Time   WBC 5.5 09/21/2021 0122   RBC 3.74 (L) 09/21/2021 0122   HGB 12.1 (L) 09/21/2021 0122   HCT 36.6 (L) 09/21/2021 0122   PLT 64 (L) 09/21/2021 0122   MCV 97.9 09/21/2021 0122   MCH 32.4 09/21/2021 0122   MCHC 33.1 09/21/2021 0122   RDW 13.5 09/21/2021 0122   LYMPHSABS 0.7 09/17/2021 0418   MONOABS 0.1 09/17/2021 0418   EOSABS 0.1 09/17/2021 0418   BASOSABS 0.0 09/17/2021 0418   CMP    Component Value Date/Time   NA 140 09/21/2021 0122   K 3.8 09/21/2021 0122   CL 105 09/21/2021 0122   CO2 27 09/21/2021 0122   GLUCOSE 101 (H) 09/21/2021 0122   BUN 21 09/21/2021 0122   CREATININE 0.98 09/21/2021 0122   CALCIUM 8.5 (L) 09/21/2021 0122   PROT 5.1 (L) 09/18/2021 0345   ALBUMIN 2.2 (L) 09/18/2021 0345   AST 28 09/18/2021 0345   ALT 31 09/18/2021 0345   ALKPHOS 78 09/18/2021 0345   BILITOT 0.7 09/18/2021 0345   GFRNONAA >60 09/21/2021 0122   COAGS Lab Results  Component Value Date   INR 1.1 09/15/2021   Lipid Panel    Component Value Date/Time   CHOL  133 09/16/2021 0409   TRIG 66 09/19/2021 0347   HDL 59 09/16/2021 0409   CHOLHDL 2.3 09/16/2021 0409   VLDL 8 09/16/2021 0409   LDLCALC 66 09/16/2021 0409   HgbA1C  Lab Results  Component Value Date   HGBA1C 5.9 (H) 09/15/2021   Urinalysis    Component Value Date/Time   COLORURINE YELLOW 09/16/2021 0251   APPEARANCEUR CLEAR 09/16/2021 0251   LABSPEC 1.030 09/16/2021 0251   PHURINE 5.0 09/16/2021 0251   GLUCOSEU >=500 (A) 09/16/2021 0251   HGBUR NEGATIVE 09/16/2021 0251   BILIRUBINUR NEGATIVE 09/16/2021 0251   KETONESUR NEGATIVE 09/16/2021 0251   PROTEINUR NEGATIVE 09/16/2021 0251   NITRITE NEGATIVE 09/16/2021 0251   LEUKOCYTESUR NEGATIVE 09/16/2021 0251   Urine Drug Screen No results found for: "LABOPIA", "COCAINSCRNUR", "LABBENZ", "AMPHETMU",  "THCU", "LABBARB"  Alcohol Level    Component Value Date/Time   ETH <10 09/15/2021 1946     SIGNIFICANT DIAGNOSTIC STUDIES IR PERCUTANEOUS ART THROMBECTOMY/INFUSION INTRACRANIAL INC DIAG ANGIO  Addendum Date: 09/20/2021   ADDENDUM REPORT: 09/20/2021 16:10 ADDENDUM: Complications: No complications seen. Electronically Signed   By: Frazier Richards M.D.   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 Patient's last known normal was at 1430 NIH stroke scale of 24, modified Rankin scale of 1. TNK was not given as the patient was on apixaban. EXAM: ULTRASOUND-GUIDED VASCULAR ACCESS DIAGNOSTIC CEREBRAL ANGIOGRAM MECHANICAL THROMBECTOMY FLAT PANEL HEAD CT COMPARISON:  None Available. MEDICATIONS: Ancef 2 g IV. The antibiotic was administered within 1 hour of the procedure Performing surgeon: Dr. Frazier Richards Assistant: Dr. Karenann Cai ANESTHESIA/SEDATION: General anesthesia CONTRAST:  Twenty-five mL of Omnipaque 300 milligram/mL FLUOROSCOPY: Radiation Exposure Index (as provided by the fluoroscopic device): 960 mGy Kerma COMPLICATIONS: SIR Level A - No therapy, no consequence. TECHNIQUE: Informed written consent was obtained from the patient's wife after a thorough discussion of the procedural risks, benefits and alternatives. All questions were addressed. Maximal Sterile Barrier Technique was utilized including caps, mask, sterile gowns, sterile gloves, sterile drape, hand hygiene and skin antiseptic. A timeout was performed prior to the initiation of the procedure. The right groin was prepped and draped in the usual sterile fashion. Using a micropuncture kit and the modified Seldinger technique, access was gained to the right common femoral artery and an 8 [place] by 25 cm sheath was placed. Real-time ultrasound guidance was  utilized for vascular access including the acquisition of a permanent ultrasound image documenting patency of the accessed vessel. Under fluoroscopy, a Zoom 88 guide catheter was navigated over a 5 [place] VTK catheter and a 0.035" Terumo Glidewire into the aortic arch. The catheter was placed into the left common carotid artery and then advanced into the left internal carotid artery under roadmap guidance. The diagnostic catheter was removed. Frontal and lateral angiograms of the head were obtained. FINDINGS: 1. Normal caliber of the right common femoral artery, adequate for vascular access. 2. Occlusion of a proximal left M1/MCA. 3. Delayed collaterals from the left ACA to the left MCA vascular tree. PROCEDURE: Using biplane roadmap, a zoom 071 aspiration catheter was navigated over an Aristotle 24 microguidewire into the cavernous segment of the left ICA. The aspiration catheter was then advanced to the level of occlusion at the left M1 MCA and connected to an aspiration pump. Continuous aspiration was performed for 2 minutes.  The guide catheter was connected to a VacLok syringe. The aspiration catheter was subsequently removed under constant aspiration. The guide catheter was aspirated for debris. Then, biplane DSA cerebral angiogram was performed by injecting contrast through the guide catheter which demonstrated complete recanalization of the proximal left MCA and its branches (TICI 3). The MCA and its branches have a normal appearance. No aneurysm, av malformation, dural fistula or vasculitis seen. The venous phase of the cerebral angiogram has a normal appearance with a preferential venous return into the right transverse, sigmoid sinuses and the into the internal jugular vein. Mild narrowing of the left transverse sinus. Flat panel CT of the head was obtained and post processed in a separate workstation with concurrent attending physician supervision. Selected images were sent to PACS. Flat panel CT of the  head has a normal appearance without evidence of a bleed. Right common femoral artery angiogram was obtained in right anterior oblique view. The puncture is at the level of the common femoral artery. The artery has normal caliber, adequate for closure device. The sheath was exchanged over the wire for a Perclose prostyle which was utilized for access closure. Immediate hemostasis was achieved. IMPRESSION: 1. Successful mechanical thrombectomy for treatment of an occlusion of the proximal left M1/MCA. Complete recanalization (TICI 3) of the left MCA was attained. 2. Flat panel CT head was unremarkable. PLAN: 1. Transfer to ICU. 2. Blood pressure goals of 120-140 mm Hg. Electronically Signed: By: Frazier Richards M.D. On: 09/16/2021 10:22   IR US Guide Vasc Access Right  Addendum Date: 09/20/2021   ADDENDUM REPORT: 09/20/2021 50:03 ADDENDUM: Complications: No complications seen. Electronically Signed   By: Frazier Richards M.D.   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 Patient's last known normal was at 1430 NIH stroke scale of 24, modified Rankin scale of 1. TNK was not given as the patient was on apixaban. EXAM: ULTRASOUND-GUIDED VASCULAR ACCESS DIAGNOSTIC CEREBRAL ANGIOGRAM MECHANICAL THROMBECTOMY FLAT PANEL HEAD CT COMPARISON:  None Available. MEDICATIONS: Ancef 2 g IV. The antibiotic was administered within 1 hour of the procedure Performing surgeon: Dr. Frazier Richards Assistant: Dr. Karenann Cai ANESTHESIA/SEDATION: General anesthesia CONTRAST:  Twenty-five mL of Omnipaque 300 milligram/mL FLUOROSCOPY: Radiation Exposure Index (as provided by the fluoroscopic device): 704 mGy Kerma COMPLICATIONS: SIR Level A - No therapy, no consequence. TECHNIQUE: Informed written consent was obtained from the patient's wife after a thorough  discussion of the procedural risks, benefits and alternatives. All questions were addressed. Maximal Sterile Barrier Technique was utilized including caps, mask, sterile gowns, sterile gloves, sterile drape, hand hygiene and skin antiseptic. A timeout was performed prior to the initiation of the procedure. The right groin was prepped and draped in the usual sterile fashion. Using a micropuncture kit and the modified Seldinger technique, access was gained to the right common femoral artery and an 8 [place] by 25 cm sheath was placed. Real-time ultrasound guidance was utilized for vascular access including the acquisition of a permanent ultrasound image documenting patency of the accessed vessel. Under fluoroscopy, a Zoom 88 guide catheter was navigated over a 5 [place] VTK catheter and a 0.035" Terumo Glidewire into the aortic arch. The catheter was placed into the left common carotid artery and then advanced into the left internal carotid artery under roadmap guidance. The diagnostic catheter was removed. Frontal and  lateral angiograms of the head were obtained. FINDINGS: 1. Normal caliber of the right common femoral artery, adequate for vascular access. 2. Occlusion of a proximal left M1/MCA. 3. Delayed collaterals from the left ACA to the left MCA vascular tree. PROCEDURE: Using biplane roadmap, a zoom 071 aspiration catheter was navigated over an Aristotle 24 microguidewire into the cavernous segment of the left ICA. The aspiration catheter was then advanced to the level of occlusion at the left M1 MCA and connected to an aspiration pump. Continuous aspiration was performed for 2 minutes. The guide catheter was connected to a VacLok syringe. The aspiration catheter was subsequently removed under constant aspiration. The guide catheter was aspirated for debris. Then, biplane DSA cerebral angiogram was performed by injecting contrast through the guide catheter which demonstrated complete recanalization of the  proximal left MCA and its branches (TICI 3). The MCA and its branches have a normal appearance. No aneurysm, av malformation, dural fistula or vasculitis seen. The venous phase of the cerebral angiogram has a normal appearance with a preferential venous return into the right transverse, sigmoid sinuses and the into the internal jugular vein. Mild narrowing of the left transverse sinus. Flat panel CT of the head was obtained and post processed in a separate workstation with concurrent attending physician supervision. Selected images were sent to PACS. Flat panel CT of the head has a normal appearance without evidence of a bleed. Right common femoral artery angiogram was obtained in right anterior oblique view. The puncture is at the level of the common femoral artery. The artery has normal caliber, adequate for closure device. The sheath was exchanged over the wire for a Perclose prostyle which was utilized for access closure. Immediate hemostasis was achieved. IMPRESSION: 1. Successful mechanical thrombectomy for treatment of an occlusion of the proximal left M1/MCA. Complete recanalization (TICI 3) of the left MCA was attained. 2. Flat panel CT head was unremarkable. PLAN: 1. Transfer to ICU. 2. Blood pressure goals of 120-140 mm Hg. Electronically Signed: By: Frazier Richards M.D. On: 09/16/2021 10:22   IR CT Head Ltd  Addendum Date: 09/20/2021   ADDENDUM REPORT: 09/20/2021 76:73 ADDENDUM: Complications: No complications seen. Electronically Signed   By: Frazier Richards M.D.   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 Patient's last known normal was at 1430 NIH stroke scale of 24, modified Rankin scale of 1. TNK was not given as the patient was on apixaban. EXAM: ULTRASOUND-GUIDED VASCULAR ACCESS DIAGNOSTIC CEREBRAL ANGIOGRAM  MECHANICAL THROMBECTOMY FLAT PANEL HEAD CT COMPARISON:  None Available. MEDICATIONS: Ancef 2 g IV. The antibiotic was administered within 1 hour of the procedure Performing surgeon: Dr. Frazier Richards Assistant: Dr. Karenann Cai ANESTHESIA/SEDATION: General anesthesia CONTRAST:  Twenty-five mL of Omnipaque 300 milligram/mL FLUOROSCOPY: Radiation Exposure Index (as provided by the fluoroscopic device): 419 mGy Kerma COMPLICATIONS: SIR Level A - No therapy, no consequence. TECHNIQUE: Informed written consent was obtained from the patient's wife after a thorough discussion of the procedural risks, benefits and alternatives. All questions were addressed. Maximal Sterile Barrier Technique was utilized including caps, mask, sterile gowns, sterile gloves, sterile drape, hand hygiene and skin antiseptic. A timeout was performed prior to the initiation of the procedure. The right groin was prepped and draped in the usual sterile fashion. Using a micropuncture kit and the modified Seldinger technique, access was  gained to the right common femoral artery and an 8 [place] by 25 cm sheath was placed. Real-time ultrasound guidance was utilized for vascular access including the acquisition of a permanent ultrasound image documenting patency of the accessed vessel. Under fluoroscopy, a Zoom 88 guide catheter was navigated over a 5 [place] VTK catheter and a 0.035" Terumo Glidewire into the aortic arch. The catheter was placed into the left common carotid artery and then advanced into the left internal carotid artery under roadmap guidance. The diagnostic catheter was removed. Frontal and lateral angiograms of the head were obtained. FINDINGS: 1. Normal caliber of the right common femoral artery, adequate for vascular access. 2. Occlusion of a proximal left M1/MCA. 3. Delayed collaterals from the left ACA to the left MCA vascular tree. PROCEDURE: Using biplane roadmap, a zoom 071 aspiration catheter was navigated over an  Aristotle 24 microguidewire into the cavernous segment of the left ICA. The aspiration catheter was then advanced to the level of occlusion at the left M1 MCA and connected to an aspiration pump. Continuous aspiration was performed for 2 minutes. The guide catheter was connected to a VacLok syringe. The aspiration catheter was subsequently removed under constant aspiration. The guide catheter was aspirated for debris. Then, biplane DSA cerebral angiogram was performed by injecting contrast through the guide catheter which demonstrated complete recanalization of the proximal left MCA and its branches (TICI 3). The MCA and its branches have a normal appearance. No aneurysm, av malformation, dural fistula or vasculitis seen. The venous phase of the cerebral angiogram has a normal appearance with a preferential venous return into the right transverse, sigmoid sinuses and the into the internal jugular vein. Mild narrowing of the left transverse sinus. Flat panel CT of the head was obtained and post processed in a separate workstation with concurrent attending physician supervision. Selected images were sent to PACS. Flat panel CT of the head has a normal appearance without evidence of a bleed. Right common femoral artery angiogram was obtained in right anterior oblique view. The puncture is at the level of the common femoral artery. The artery has normal caliber, adequate for closure device. The sheath was exchanged over the wire for a Perclose prostyle which was utilized for access closure. Immediate hemostasis was achieved. IMPRESSION: 1. Successful mechanical thrombectomy for treatment of an occlusion of the proximal left M1/MCA. Complete recanalization (TICI 3) of the left MCA was attained. 2. Flat panel CT head was unremarkable. PLAN: 1. Transfer to ICU. 2. Blood pressure goals of 120-140 mm Hg. Electronically Signed: By: Frazier Richards M.D. On: 09/16/2021 10:22   DG Abd Portable 1V  Result Date:  09/19/2021 CLINICAL DATA:  Ileus. EXAM: PORTABLE ABDOMEN - 1 VIEW COMPARISON:  One-view abdomen 09/18/2021 FINDINGS: Previously noted dilated loops of small bowel have normalized. No obstruction or free air is present. Degenerative changes are present in the lower lumbar spine. IMPRESSION: 1. Interval resolution of dilated loops of small bowel. 2. No acute abnormality. Electronically Signed   By: San Morelle M.D.   On: 09/19/2021 14:54   DG Abd Portable 1V  Result Date: 09/18/2021 CLINICAL DATA:  Abdominal distention. EXAM: PORTABLE ABDOMEN - 1 VIEW COMPARISON:  09/16/2021. FINDINGS: Flat plate study in 2 films at 4:54 a.m., 09/18/2021. There is increased opacity in the left lung base consistent with consolidation, atelectasis or aspiration. Enteric catheter has been advanced with tip now superimposing over the left iliac crest. If the patient has had gastric bypass with gastrojejunostomy the tube is probably  in a jejunal segment. Postsurgical changes are again noted in the left and right upper quadrant. There is improved gaseous distention of central abdominal large bowel segments. Currently no dilated small bowel is seen. Retained fecal burden appears somewhat improved particularly in the ascending and descending colon. Colonic gas remains present through into the rectum. Visceral shadows are stable. There is no supine evidence of free air. In all other respects there are no further changes. IMPRESSION: 1. Increasingly dense consolidation in the left lower lobe consistent with atelectasis, pneumonia or aspiration. 2. Improved colonic distention, currently with no small bowel dilatation being seen. 3. Interval advancement of the enteric tube with the tip superimposing over the left iliac crest. If there has been a gastric bypass, the tip of the tube is probably in a jejunal segment past the gastrojejunostomy. Electronically Signed   By: Telford Nab M.D.   On: 09/18/2021 07:07   DG Chest Port 1  View  Result Date: 09/17/2021 CLINICAL DATA:  Hypoxia. 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 Endotracheal tube terminates at the level of the clavicles, well above the carina. Right PICC terminates over the SVC, unchanged. Enteric tube courses into the abdomen with tip not imaged. The cardiac silhouette remains mildly enlarged. Left perihilar and left lower lobe opacities are unchanged. No sizable pleural effusion or pneumothorax is identified. IMPRESSION: Unchanged left perihilar and lower lobe airspace disease concerning for pneumonia. Electronically Signed   By: Logan Bores M.D.   On: 09/17/2021 08:54   ECHOCARDIOGRAM COMPLETE  Result Date: 09/16/2021    ECHOCARDIOGRAM REPORT   Patient Name:   [facility] Date of Exam: 09/16/2021 Medical Rec #:  [id number]      Height:       73.0 in Accession #:    [id number]     Weight:       165.8 lb Date of Birth:  1941/03/20      BSA:          1.987 m Patient Age:    2 years       BP:           102/60 mmHg Patient Gender: M              HR:           63 bpm. Exam Location:  Inpatient Procedure: 2D Echo, Cardiac Doppler and Color Doppler Indications:    Stroke  History:        Patient has no prior history of Echocardiogram examinations.                 Aortic Valve Disease, Arrythmias:Atrial Fibrillation; Risk                 Factors:Former Smoker and Diabetes. AVR. Acute respiratory                 failure with hypoxia. HFrEF.                 Aortic Valve: valve is present in the aortic position. Procedure                 Date: unknown.  Sonographer:    Clayton Lefort RDCS (AE) Referring Phys: [id number] [place]  Sonographer Comments: Echo performed with patient supine and on artificial respirator. Unable to use Definity due to lack of line access per nurse. IMPRESSIONS  1. Left ventricular ejection fraction, by estimation,  is 20 to 25%. The left ventricle has  severely decreased function. The left ventricle demonstrates global hypokinesis. Left ventricular diastolic parameters are consistent with Grade I diastolic dysfunction (impaired relaxation).  2. Right ventricular systolic function is mildly reduced. The right ventricular size is mildly enlarged.  3. Left atrial size was mildly dilated.  4. Right atrial size was severely dilated.  5. The mitral valve is normal in structure. No evidence of mitral valve regurgitation. No evidence of mitral stenosis.  6. 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 The aortic valve has been repaired/replaced. Aortic valve regurgitation is not visualized. No aortic stenosis is present. There is a valve present in the aortic position. Procedure Date: unknown. Aortic valve mean gradient measures 8.0 mmHg. Aortic valve Vmax measures 2.00 m/s.  7. The inferior vena cava is normal in size with greater than 50% respiratory variability, suggesting right atrial pressure of 3 mmHg. FINDINGS  Left Ventricle: Left ventricular ejection fraction, by estimation, is 20 to 25%. The left ventricle has severely decreased function. The left ventricle demonstrates global hypokinesis. The left ventricular internal cavity size was normal in size. There is no left ventricular hypertrophy. Left ventricular diastolic parameters are consistent with Grade I diastolic dysfunction (impaired relaxation). Right Ventricle: The right ventricular size is mildly enlarged. No increase in right ventricular wall thickness. Right ventricular systolic function is mildly reduced. Left Atrium: Left atrial size was mildly dilated. Right Atrium: Right atrial size was severely dilated. Pericardium: There is no evidence of pericardial effusion. Mitral Valve: The mitral valve is normal in structure. Mild to moderate mitral annular calcification. No evidence of mitral valve regurgitation. No evidence of mitral valve stenosis. Tricuspid Valve: The  tricuspid valve is normal in structure. Tricuspid valve regurgitation is not demonstrated. No evidence of tricuspid stenosis. 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 The aortic valve has been repaired/replaced. Aortic valve regurgitation is not visualized. No aortic stenosis is present. Aortic valve mean gradient measures 8.0 mmHg. Aortic valve peak gradient measures 16.0 mmHg. Aortic valve area, by VTI measures 1.10 cm. There is a valve present in the aortic position. Procedure Date: unknown. Pulmonic Valve: The pulmonic valve was not well visualized. Pulmonic valve regurgitation is trivial. No evidence of pulmonic stenosis. Aorta: The aortic root is normal in size and structure. Venous: The inferior vena cava is normal in size with greater than 50% respiratory variability, suggesting right atrial pressure of 3 mmHg. IAS/Shunts: No atrial level shunt detected by color flow Doppler.  LEFT VENTRICLE PLAX 2D LVIDd:         5.30 cm      Diastology LVIDs:         4.90 cm      LV e' medial:    4.13 cm/s LV PW:         1.20 cm      LV E/e' medial:  19.8 LV IVS:        2.00 cm      LV e' lateral:   9.14 cm/s LVOT diam:     2.00 cm      LV E/e' lateral: 8.9 LV SV:         38 LV SV Index:   19 LVOT Area:     3.14 cm  LV Volumes (MOD) LV vol d, MOD A2C: 158.0 ml LV vol d, MOD A4C: 170.0 ml LV vol s, MOD A2C: 94.3 ml LV vol s, MOD A4C:  139.0 ml LV SV MOD A2C:     63.7 ml LV SV MOD A4C:     170.0 ml LV SV MOD BP:      53.6 ml RIGHT VENTRICLE RV Basal diam:  3.20 cm RV S prime:     6.74 cm/s TAPSE (M-mode): 0.8 cm LEFT ATRIUM             Index        RIGHT ATRIUM           Index LA diam:        3.90 cm 1.96 cm/m   RA Area:     25.90 cm LA Vol (A2C):   66.4 ml 33.42 ml/m  RA Volume:   85.90 ml  43.24 ml/m LA Vol (A4C):   56.6 ml 28.49 ml/m LA Biplane Vol: 66.5 ml 33.47 ml/m  AORTIC VALVE AV Area (Vmax):    1.20 cm AV Area (Vmean):   1.25 cm AV Area (VTI):     1.10 cm  AV Vmax:           200.00 cm/s AV Vmean:          131.000 cm/s AV VTI:            0.349 m AV Peak Grad:      16.0 mmHg AV Mean Grad:      8.0 mmHg LVOT Vmax:         76.40 cm/s LVOT Vmean:        52.300 cm/s LVOT VTI:          0.122 m LVOT/AV VTI ratio: 0.35  AORTA Ao Root diam: 2.70 cm Ao Asc diam:  4.00 cm MITRAL VALVE MV Area (PHT): 4.06 cm    SHUNTS MV Decel Time: 187 msec    Systemic VTI:  0.12 m MV E velocity: 81.80 cm/s  Systemic Diam: 2.00 cm Kardie Tobb DO Electronically signed by Berniece Salines DO Signature Date/Time: 09/16/2021/5:54:30 PM    Final    DG CHEST PORT 1 VIEW  Result Date: 09/16/2021 CLINICAL DATA:  PICC line placement. EXAM: PORTABLE CHEST 1 VIEW COMPARISON:  Chest x-ray 09/16/2021 FINDINGS: Endotracheal tube tip is 5.2 cm above the carina. Enteric tube extends below the diaphragm. There is a new right upper extremity PICC terminating in the SVC. Patient is status post cardiac surgery and valve replacement, unchanged. Cardiac silhouette is enlarged. There is some increasing retrocardiac opacities. No pleural effusion or pneumothorax identified. Osseous structures are stable. IMPRESSION: 1. Right upper extremity PICC terminates in the SVC. 2. Increasing retrocardiac atelectasis/airspace disease. 3. Stable cardiomegaly. Electronically Signed   By: Ronney Asters M.D.   On: 09/16/2021 15:30   [place] EKG SITE RITE  Result Date: 09/16/2021 If Site Rite image not attached, placement could not be confirmed due to current cardiac rhythm.  DG Abd Portable 1V  Addendum Date: 09/16/2021   ADDENDUM REPORT: 09/16/2021 08:27 ADDENDUM: Study discussed by telephone with Nurse Apolonio Schneiders on 09/16/2021 at 0823 hours. Electronically Signed   By: Genevie Ann M.D.   On: 09/16/2021 08:27   Result Date: 09/16/2021 CLINICAL DATA:  81 year old male with projectile vomiting. EXAM: PORTABLE ABDOMEN - 1 VIEW COMPARISON:  09/15/2021. FINDINGS: Portable AP supine view at 0756 hours. Enteric tube appears pulled back slightly,  side hole now at the expected level of the GEJ. Stable epigastric and right upper quadrant surgical clips. Negative visible lung bases. Increasing gas-filled bowel loops throughout the abdomen and pelvis, superimposed on retained stool in  the colon. Possible dilated small bowel now. But gas is present to the rectum. No definite pneumoperitoneum on these supine views. No acute osseous abnormality identified. IMPRESSION: 1. Enteric tube has been pulled back, side hole now at the level of the GEJ. Recommend advancing 6 cm to ensure side hole placement inside the stomach. 2. Increasing gas-filled bowel throughout the abdomen and pelvis, most suggestive of Acute Ileus. Electronically Signed: By: Genevie Ann M.D. On: 09/16/2021 08:18   DG CHEST PORT 1 VIEW  Result Date: 09/16/2021 CLINICAL DATA:  81 year old male with history of chest congestion. EXAM: PORTABLE CHEST 1 VIEW COMPARISON:  Chest x-ray 09/16/2021. FINDINGS: An endotracheal tube is in place with tip 7.4 cm above the carina. A nasogastric tube is seen extending into the stomach, however, the tip of the nasogastric tube extends below the lower margin of the image. Lung volumes are normal. No consolidative airspace disease. No pleural effusions. No pneumothorax. No pulmonary nodule or mass noted. Pulmonary vasculature and the cardiomediastinal silhouette are within normal limits. 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 IMPRESSION: 1. Support apparatus, as above. 2. No radiographic evidence of acute cardiopulmonary disease. 3. Aortic atherosclerosis. Electronically Signed   By: Vinnie Langton M.D.   On: 09/16/2021 08:12   DG Chest Port 1 View  Result Date: 09/16/2021 CLINICAL DATA:  81 year old male with history of stroke. EXAM: PORTABLE CHEST 1 VIEW COMPARISON:  Chest x-ray 09/15/2021. FINDINGS: An endotracheal tube is in place with tip 7.0 cm above the carina. A  nasogastric tube is seen extending into the stomach, however, the tip of the nasogastric tube extends below the lower margin of the image. Lung volumes are normal. No consolidative airspace disease. No pleural effusions. No pneumothorax. No evidence of pulmonary edema. Heart size is mildly enlarged. Upper mediastinal contours are within normal limits. Atherosclerotic calcifications are noted in the thoracic aorta. Status post median sternotomy for aortic valve replacement (a stented bioprosthesis is noted). IMPRESSION: 1. Postoperative changes and support apparatus, as above. 2. No radiographic evidence of acute cardiopulmonary disease. 3. Mild cardiomegaly. 4. Aortic atherosclerosis. Electronically Signed   By: Vinnie Langton M.D.   On: 09/16/2021 05:50   MR ANGIO HEAD WO CONTRAST  Result Date: 09/16/2021 CLINICAL DATA:  81 year old male with paroxysmal atrial fibrillation on Eliquis. New onset aphasia, right side weakness. Code stroke presentation yesterday. Left M1 occlusion by CTA. EXAM: MRA HEAD WITHOUT CONTRAST TECHNIQUE: Angiographic images of the Circle of Willis were acquired using MRA technique without intravenous contrast. COMPARISON:  MRI brain today reported separately. CTA head and neck yesterday. FINDINGS: Anterior circulation: Antegrade flow in both ICA siphons with bilateral siphon irregularity in keeping with the atherosclerosis and stenosis better demonstrated by CTA. Carotid termini remain patent. And now the left MCA M1 segment, anterior temporal artery origin, and left MCA bifurcation are patent. Left MCA branches appear not significantly changed from yesterday, including a long segment M2 stenosis demonstrated in the posterior division on series 1035, image 4. Right MCA M1 segment is patent but with moderate to severe stenosis upstream of the right MCA bifurcation, concordant with CTA appearance yesterday. Likewise, right MCA branches appear stable with mild irregularity. Anterior  communicating artery and visible ACA branches are stable, mild right A2 stenosis. Posterior circulation: Antegrade flow in the dominant distal left vertebral artery with patent left PICA origin. No distal left vertebral stenosis. Asymmetrically decreased flow in the smaller distal right vertebral artery, which  did appear atherosclerotic and stenotic on the CTA yesterday but was patent at that time to the vertebrobasilar junction. Right PICA origin appears to remain patent. Patent basilar artery without stenosis. Patent SCA and PCA origins. Bilateral PCA branches appear stable, within normal limits. Anatomic variants: Dominant left vertebral V4 segment. Other: Brain MRI reported separately today. IMPRESSION: 1. Recanalized Left MCA M1 segment. Patent left MCA bifurcation. Upstream ICA atherosclerosis and stenosis, and downstream MCA M2 branch stenosis appears unchanged from the CTA yesterday. 2. Elsewhere stable intracranial atherosclerosis and stenosis, most notably both ICA siphons, Right MCA bifurcation, distal Right Vertebral Artery. Electronically Signed   By: Genevie Ann M.D.   On: 09/16/2021 05:49   MR BRAIN WO CONTRAST  Result Date: 09/16/2021 CLINICAL DATA:  81 year old male with paroxysmal atrial fibrillation on Eliquis. New onset aphasia, right side weakness. Code stroke presentation yesterday. Left M1 occlusion. EXAM: MRI HEAD WITHOUT CONTRAST TECHNIQUE: Multiplanar, multiecho pulse sequences of the brain and surrounding structures were obtained without intravenous contrast. COMPARISON:  CT head and CTA head and neck yesterday. FINDINGS: Brain: Patchy restricted diffusion throughout the left basal ganglia including the caudate and the lentiform. There is some involvement of the posterior limb of the left external capsule. And there is some mesial temporal lobe involvement on series 5, image 73. But otherwise Insula and other left MCA territory cortex seems spared. No contralateral right hemisphere or  posterior fossa restricted diffusion. Multiple chronic generally small cerebellar infarcts on the right. Patchy and confluent bilateral cerebral white matter T2 and FLAIR hyperintensity with a small area of chronic left middle frontal gyrus encephalomalacia (series 10, image 19). There are several chronic microhemorrhages also in the left frontal lobe (series 22, image 35). No midline shift, mass effect, evidence of mass lesion, ventriculomegaly, extra-axial collection or acute intracranial hemorrhage. Cervicomedullary junction and pituitary are within normal limits. Vascular: Major intracranial vascular flow voids are preserved except for the distal right vertebral artery, which was non dominant but patent by CTA yesterday. Skull and upper cervical spine: Negative. Visualized bone marrow signal is within normal limits. Sinuses/Orbits: Postoperative changes to both globes. Paranasal Visualized paranasal sinuses and mastoids are stable and well aerated. Other: Intubated. Small volume fluid in the pharynx and nasal cavity. Grossly normal visible internal auditory structures. IMPRESSION: 1. Patchy acute infarct in the left basal ganglia and the left mesial temporal lobe with no associated hemorrhage or mass effect. 2. Underlying chronic small and medium-sized vessel ischemia. 3. MRA Head reported separately today. Electronically Signed   By: Genevie Ann M.D.   On: 09/16/2021 05:23   DG Abd Portable 1V  Result Date: 09/15/2021 CLINICAL DATA:  OG tube placement. EXAM: PORTABLE ABDOMEN - 1 VIEW COMPARISON:  None Available. FINDINGS: Tip and side port of the enteric tube below the diaphragm in the stomach. Enteric chain sutures in the left upper quadrant as well as multiple surgical clips. Surgical clips in the right upper quadrant. No bowel dilatation or evidence of obstruction in the included upper abdomen. IMPRESSION: Tip and side port of the enteric tube below the diaphragm in the stomach. Electronically Signed   By:  Keith Rake M.D.   On: 09/15/2021 21:23   DG CHEST PORT 1 VIEW  Result Date: 09/15/2021 CLINICAL DATA:  Intubation. EXAM: PORTABLE CHEST 1 VIEW COMPARISON:  10/02/2020 FINDINGS: Endotracheal tube tip 5.4 cm from the carina at the level of the clavicular heads. Enteric tube in place with tip below the diaphragm not included on this chest  field of view. Prior median sternotomy prosthetic aortic valve. Cardiomegaly. Hazy opacity at the left greater than right lung base likely represents combination of atelectasis and pleural effusions. Vascular congestion. No pneumothorax. IMPRESSION: 1. Endotracheal tube tip 5.4 cm from the carina at the level of the clavicular heads. Enteric tube in place with tip below the diaphragm. 2. Cardiomegaly. Vascular congestion. Hazy bibasilar lung opacities likely combination of pleural effusions and atelectasis. Suspect CHF. Electronically Signed   By: Keith Rake M.D.   On: 09/15/2021 21:21   CT ANGIO HEAD NECK W WO CM (CODE STROKE)  Result Date: 09/15/2021 CLINICAL DATA:  Provided history: Neuro deficit, acute, stroke suspected; right-sided weakness and facial droop. EXAM: CT ANGIOGRAPHY HEAD AND NECK TECHNIQUE: Multidetector CT imaging of the head and neck was performed using the standard protocol during bolus administration of intravenous contrast. Multiplanar CT image reconstructions and MIPs were obtained to evaluate the vascular anatomy. Carotid stenosis measurements (when applicable) are obtained utilizing NASCET criteria, using the distal internal carotid diameter as the denominator. RADIATION DOSE REDUCTION: This exam was performed according to the departmental dose-optimization program which includes automated exposure control, adjustment of the mA and/or kV according to patient size and/or use of iterative reconstruction technique. CONTRAST:  64m OMNIPAQUE IOHEXOL 350 MG/ML SOLN COMPARISON:  Noncontrast head CT performed immediately prior 09/15/2021.  FINDINGS: CTA NECK FINDINGS Aortic arch: Standard aortic branching. Atherosclerotic plaque within the visualized aortic arch and proximal major branch vessels of the neck. No hemodynamically significant innominate or proximal subclavian artery stenosis. Right carotid system: CCA and ICA patent within the neck. Mild scattered atherosclerotic plaque within the proximal to mid CCA. Prominent predominantly calcified plaque about the carotid bifurcation and within the proximal ICA. Resultant in 50-60% stenosis at the origin of the right ICA. Left carotid system: CCA and ICA patent within the neck. Mild atherosclerotic plaque scattered within the proximal to mid CCA. Prominent atherosclerotic plaque about the carotid bifurcation and within the proximal ICA. Resultant 40% stenosis of the proximal ICA. Vertebral arteries: Vertebral arteries patent within the neck. The left vertebral artery is dominant. Severe atherosclerotic stenosis at the origin of the right vertebral artery. Mild atherosclerotic narrowing at the origin of the left vertebral artery. Skeleton: Cervical spondylosis. No acute fracture or aggressive osseous lesion. Other neck: No neck mass or cervical lymphadenopathy. Upper chest: Prior median sternotomy. Smooth interlobular septal thickening within the imaged lung apices, likely reflecting interstitial edema. Partially imaged right pleural effusion. Review of the MIP images confirms the above findings CTA HEAD FINDINGS Anterior circulation: The intracranial internal carotid arteries are patent. Atherosclerotic plaque within both vessels. Most notably, there is moderate atherosclerotic narrowing of the distal cavernous/paraclinoid right ICA, and severe stenosis of the distal cavernous/paraclinoid left ICA. The left M1 MCA segment is occluded at its origin. There is some reconstitution of enhancement within M2 and more distal left MCA vessels. The right M1 segment is patent. Atherosclerotic irregularity of the  M2 and more distal right MCA vessels. No appreciable right M2 proximal branch occlusion or high-grade proximal stenosis. The anterior cerebral arteries are patent. Posterior circulation: The intracranial vertebral arteries are patent. Nonstenotic atherosclerotic plaque within the non dominant V4 right vertebral artery. The basilar artery is patent. The posterior cerebral arteries are patent. Atherosclerotic irregularity of both vessels without high-grade proximal stenosis. Posterior communicating arteries are diminutive or absent, bilaterally. Venous sinuses: Within the limitations of contrast timing, no convincing thrombus. Anatomic variants: As described. Review of the MIP images confirms the above  findings CTA head impression #1 called by telephone at the time of interpretation on 09/15/2021 at 3:50 pm to provider Isla Pence, who verbally acknowledged these results. IMPRESSION: CTA neck: 1. The common carotid, internal carotid and vertebral arteries are patent within the neck. 2. Atherosclerotic narrowing at the origin of the right ICA of 50-60%. 3. Atherosclerotic narrowing at the origin of the left ICA of 40%. 4. Severe stenosis at the origin of the non-dominant right vertebral artery. 5. Mild atherosclerotic narrowing at the origin of the dominant left vertebral artery. 6.  Aortic Atherosclerosis (ICD10-I70.0). 7. Interstitial edema within the imaged lung apices. 8. Partially imaged right pleural effusion. CTA head: 1. The M1 segment of the left middle cerebral artery is occluded at its origin. There is some reconstitution of enhancement within M2 and more distal left MCA vessels, likely due to collateral flow. 2. 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 3. Severe stenosis of the distal cavernous/paraclinoid left ICA. 4. Moderate stenosis of the distal cavernous/paraclinoid right ICA. Electronically Signed   By: Kellie Simmering D.O.   On: 09/15/2021  16:20   CT HEAD CODE STROKE WO CONTRAST  Result Date: 09/15/2021 CLINICAL DATA:  Code stroke. Neuro deficit, acute, stroke suspected. Right-sided weakness and facial droop. EXAM: CT HEAD WITHOUT CONTRAST TECHNIQUE: Contiguous axial images were obtained from the base of the skull through the vertex without intravenous contrast. RADIATION DOSE REDUCTION: This exam was performed according to the departmental dose-optimization program which includes automated exposure control, adjustment of the mA and/or kV according to patient size and/or use of iterative reconstruction technique. COMPARISON:  No pertinent prior exams available for comparison. FINDINGS: Brain: Mild generalized cerebral atrophy. Suspected subtle loss of gray-white differentiation within the left insula (for instance as seen on series 2, image 18). Small left MCA territory cortical/subcortical infarct within the left parietal lobe (involving the left postcentral gyrus). This is favored chronic. Background mild patchy and ill-defined hypoattenuation within the cerebral white matter, nonspecific but compatible chronic small vessel ischemic disease. This includes a chronic lacunar infarct within the right centrum semiovale/corona radiata. 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 There is no acute intracranial hemorrhage. No extra-axial fluid collection. No evidence of an intracranial mass. No midline shift. Vascular: Hyperdense M1 left MCA segment, highly suspicious for presence of endoluminal thrombus. Skull: No fracture or aggressive osseous lesion. Sinuses/Orbits: No mass or acute finding within the imaged orbits. Small mucous retention cyst versus polyp within the left maxillary sinus. Minimal mucosal thickening within the right sphenoid sinus and scattered within the bilateral ethmoid sinuses. ASPECTS ([place] Stroke Program Early CT Score) - Ganglionic level infarction (caudate, lentiform nuclei, internal capsule, insula,  M1-M3 cortex): 6 - Supraganglionic infarction (M4-M6 cortex): 3 Total score (0-10 with 10 being normal): 9 Critical/emergent results were called by telephone at the time of interpretation on 09/15/2021 at 3:45 pm to provider JULIE HAVILAND , who verbally acknowledged these results. IMPRESSION: 1. Hyperdense left M1 MCA segment highly suspicious for the presence of endoluminal thrombus. 2. Suspected subtle acute infarct involving the left insula. ASPECTS is 9. 3. Small left MCA territory cortically-based infarct within the left parietal lobe (with involvement of the postcentral gyrus), favored chronic. 4. Background mild chronic small ischemic changes within the cerebral white matter. This includes a chronic lacunar infarct within the right centrum semiovale/corona radiata. 5. 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 6. Mild generalized parenchymal atrophy. Electronically Signed   By: Kellie Simmering  D.O.   On: 09/15/2021 15:50       HISTORY OF PRESENT ILLNESS Patient with a history of CHF, atrial fibrillation on Eliquis, CKD, HTN, DM, DVT, PE and aortic stenosis s/p valve replacement presented with acute onset aphasia, right sided weakness and right facial droop.   HOSPITAL COURSE Patient was taken to IR for mechanical thrombectomy, and TICI3 flow was achieved.  TNK was not given due to Eliquis use.  MRI revels small infarcts in the left basal ganglia, corona radiata and mesial temporal lobe.  Patient has recovered uneventfully from his thrombectomy and is now ready to be discharged to CIR.  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 IR with left M1 occlusion s/p TICI3 reperfusion MRI left BG, CR and mesial temporal lobe small infarcts MRA left M1 occlusion recannulized, patent left MCA.  Stable intracranial atherosclerosis and stenosis most notably both ICA siphon, right MCA bifurcation and distal right VA 2D Echo EF 20 to 25% LDL 66 HgbA1c 5.9 Heparin subcu for VTE prophylaxis Eliquis (apixaban) daily prior to admission, continued on   Eliquis Ongoing aggressive stroke risk factor management    Respiratory failure Aspiration Intubated for procedure and airway protection Had a massive aspiration 6/15  On Rocephin Extubated 6/17 and tolerating well CCM on board   Chronic A-fib On Eliquis at home Rate controlled Also on amiodarone and metoprolol Follow-up with Dr. Jac Canavan at [place] [place]   Cardiomyopathy EF 20 to 25% Follows with Dr. Jac Canavan at [facility] Not on significant heart failure regimen given hypotension and bradycardia. Continue Eliquis   Ileus, improving Likely the cause of large vomitus with aspiration Bowel rest CCM on board   Diabetes HgbA1c 5.9 goal < 7.0 Controlled CBG monitoring SSI DM education and close PCP follow up   History of hypertension Hypotension BP on the low end On Levophed CCM on board Long term BP goal normotensive   Lipid management Home meds: None LDL 66, goal < 70 No statin for now given advanced age and LDL at goal    AKI on CKD CKD 3, creatinine 1.60-1.39->2.43->1.54 CCM on board Avoid nephrotoxic meds   Other Stroke Risk Factors Advanced age PE in 2022 Chronic DVT   Other Active Problems AS s/p bioprosthetic aortic valve   DISCHARGE EXAM Blood pressure 122/70, pulse (!) 44, temperature 98.2 F (36.8 C), resp. rate 18, height 6' 1"  (1.854 m), weight 75.2 kg, SpO2 93 %.  General -frail elderly Caucasian male.  Not in distress.   Ophthalmologic - fundi not visualized due to noncooperation.    Cardiovascular - irregularly irregular heart rate and rhythm.   Neuro -awake and alert and interactive able to follow all simple commands, no aphasia but paucity of speech, able to name and repeat in dysarthric voice. Eyes mid position, visual field full, no gaze palsy, PERRL. Mild right facial droop and tongue protrusion not complete. BUE 3/5 and slight drift bilaterally, b/l LE at least 3-/5 bilaterally symmetrical. DTR 1+ and no babinski. Sensation symmetrical, coordination b/l FTN grossly intact but slow on action, and gait not tested.  Discharge Diet  Diet   DIET DYS 3 Room service appropriate? Yes with Assist; Fluid consistency: Thin   liquids  DISCHARGE PLAN Disposition:  Transfer to [facility] for ongoing PT, OT and ST Eliquis (apixaban) daily for secondary stroke prevention  Recommend ongoing stroke risk factor control by Primary Care Physician at time of discharge from inpatient rehabilitation. Follow-up PCP Kirkland Hun A, PA-C in 2 weeks following discharge from rehab. Follow-up in [facility] in 4 weeks following discharge from rehab, office to schedule an appointment.   32 minutes were spent preparing discharge.  Glasscock , MSN, AGACNP-BC [facility] See Amion for schedule and pager information 09/22/2021 2:29 PM   I have personally obtained history,examined this patient, reviewed notes, independently viewed imaging studies, participated in medical decision making and plan of care.ROS completed by me personally and pertinent positives fully documented  I have made any additions or clarifications directly to the above note. Agree with note above.    Antony Contras, MD Medical Director [facility] Pager: [phone] 09/22/2021 2:50 PM

## 2021-09-22 NOTE — Plan of Care (Signed)
  Problem: Education: Goal: Knowledge of General Education information will improve Description: Including pain rating scale, medication(s)/side effects and non-pharmacologic comfort measures Outcome: Progressing   Problem: Health Behavior/Discharge Planning: Goal: Ability to manage health-related needs will improve Outcome: Progressing   Problem: Clinical Measurements: Goal: Ability to maintain clinical measurements within normal limits will improve Outcome: Progressing Goal: Will remain free from infection Outcome: Progressing Goal: Diagnostic test results will improve Outcome: Progressing Goal: Respiratory complications will improve Outcome: Progressing Goal: Cardiovascular complication will be avoided Outcome: Progressing   Problem: Activity: Goal: Risk for activity intolerance will decrease Outcome: Progressing   Problem: Nutrition: Goal: Adequate nutrition will be maintained Outcome: Progressing   Problem: Coping: Goal: Level of anxiety will decrease Outcome: Progressing   Problem: Elimination: Goal: Will not experience complications related to bowel motility Outcome: Progressing Goal: Will not experience complications related to urinary retention Outcome: Progressing   Problem: Pain Managment: Goal: General experience of comfort will improve Outcome: Progressing   Problem: Safety: Goal: Ability to remain free from injury will improve Outcome: Progressing   Problem: Skin Integrity: Goal: Risk for impaired skin integrity will decrease Outcome: Progressing   Problem: Education: Goal: Understanding of CV disease, CV risk reduction, and recovery process will improve Outcome: Progressing Goal: Individualized Educational Video(s) Outcome: Progressing   Problem: Activity: Goal: Ability to return to baseline activity level will improve Outcome: Progressing   Problem: Cardiovascular: Goal: Ability to achieve and maintain adequate cardiovascular perfusion  will improve Outcome: Progressing Goal: Vascular access site(s) Level 0-1 will be maintained Outcome: Progressing   Problem: Health Behavior/Discharge Planning: Goal: Ability to safely manage health-related needs after discharge will improve Outcome: Progressing   Problem: Education: Goal: Understanding of CV disease, CV risk reduction, and recovery process will improve Outcome: Progressing Goal: Individualized Educational Video(s) Outcome: Progressing   Problem: Activity: Goal: Ability to return to baseline activity level will improve Outcome: Progressing   Problem: Cardiovascular: Goal: Ability to achieve and maintain adequate cardiovascular perfusion will improve Outcome: Progressing Goal: Vascular access site(s) Level 0-1 will be maintained Outcome: Progressing   Problem: Health Behavior/Discharge Planning: Goal: Ability to safely manage health-related needs after discharge will improve Outcome: Progressing   Problem: Education: Goal: Knowledge of disease or condition will improve Outcome: Progressing Goal: Knowledge of secondary prevention will improve (SELECT ALL) Outcome: Progressing Goal: Knowledge of patient specific risk factors will improve (INDIVIDUALIZE FOR PATIENT) Outcome: Progressing Goal: Individualized Educational Video(s) Outcome: Progressing   Problem: Education: Goal: Ability to describe self-care measures that may prevent or decrease complications (Diabetes Survival Skills Education) will improve Outcome: Progressing Goal: Individualized Educational Video(s) Outcome: Progressing   Problem: Coping: Goal: Ability to adjust to condition or change in health will improve Outcome: Progressing   Problem: Fluid Volume: Goal: Ability to maintain a balanced intake and output will improve Outcome: Progressing   Problem: Health Behavior/Discharge Planning: Goal: Ability to identify and utilize available resources and services will improve Outcome:  Progressing Goal: Ability to manage health-related needs will improve Outcome: Progressing   Problem: Metabolic: Goal: Ability to maintain appropriate glucose levels will improve Outcome: Progressing   Problem: Nutritional: Goal: Maintenance of adequate nutrition will improve Outcome: Progressing Goal: Progress toward achieving an optimal weight will improve Outcome: Progressing   Problem: Skin Integrity: Goal: Risk for impaired skin integrity will decrease Outcome: Progressing   Problem: Tissue Perfusion: Goal: Adequacy of tissue perfusion will improve Outcome: Progressing

## 2021-09-22 NOTE — Progress Notes (Signed)
Inpatient Rehabilitation Admission Medication Review by a Pharmacist  A complete drug regimen review was completed for this patient to identify any potential clinically significant medication issues.  High Risk Drug Classes Is patient taking? Indication by Medication  Antipsychotic Yes Compazine- N/V  Anticoagulant Yes Apixaban- AF  Antibiotic No   Opioid No   Antiplatelet No   Hypoglycemics/insulin Yes Insulin- T2DM  Vasoactive Medication Yes Coreg- rate control  Chemotherapy No   Other Yes Synthroid- hypothyroidism Lipitor- HLD Trazodone- sleep     Type of Medication Issue Identified Description of Issue Recommendation(s)  Drug Interaction(s) (clinically significant)     Duplicate Therapy     Allergy     No Medication Administration End Date     Incorrect Dose     Additional Drug Therapy Needed     Significant med changes from prior encounter (inform family/care partners about these prior to discharge).    Other  PTA meds: Lasix 40 qam Vitamin B-12 1000 mcg SL Qday FeEC 325 mg daily Vitamin D3 1000 units daily Vitamin B-complex 1 qday Restart PTA meds when and if necessary during CIR admission or at time of discharge, if warranted    Clinically significant medication issues were identified that warrant physician communication and completion of prescribed/recommended actions by midnight of the next day:  No  Time spent performing this drug regimen review (minutes):  30  Rashawn Rolon BS, PharmD, BCPS Clinical Pharmacist 09/22/2021 5:53 PM  Contact: [phone] after 3 PM  "Be curious, not judgmental..." -Debbora Dus

## 2021-09-22 NOTE — Progress Notes (Signed)
RestartSTROKE TEAM PROGRESS NOTE   SUBJECTIVE (INTERVAL HISTORY) His wife is at the bedside.  Pt continues to do well and has been breathing fine.  He is working with the therapist.  Neurologically stable.  Vital signs stable.  He does have bed in inpatient rehab today and will be transferred later OBJECTIVE Temp:  [97.8 F (36.6 C)-98.3 F (36.8 C)] 98.2 F (36.8 C) (06/21 1226) Pulse Rate:  [43-73] 44 (06/21 1226) Cardiac Rhythm: Atrial fibrillation (06/21 0818) Resp:  [18-19] 18 (06/21 1226) BP: (122-150)/(70-78) 122/70 (06/21 1226) SpO2:  [93 %-96 %] 93 % (06/21 1226)  Recent Labs  Lab 09/21/21 1155 09/21/21 1639 09/21/21 2124 09/22/21 0616 09/22/21 1140  GLUCAP 143* 136* 118* 195* 162*   Recent Labs  Lab 09/15/21 1946 09/16/21 0409 09/17/21 0418 09/18/21 0345 09/19/21 0347 09/20/21 0508 09/21/21 0122  NA  --    < > 134* 132* 133* 130* 140  K  --    < > 5.1 5.3* 4.0 3.3* 3.8  CL  --    < > 103 98 98 96* 105  CO2  --    < > _0 GLUCOSE  --    < > 144* 110* 93 348* 101*  BUN  --    < > 31* 40* 39* 22 21  CREATININE  --    < > 2.43* 2.12* 1.54* 1.08 0.98  CALCIUM  --    < > 7.7* 7.8* 7.9* 7.7* 8.5*  MG 1.9  --  2.4 2.4  --   --   --   PHOS 2.2*  --  7.2* 5.8*  --   --   --    < > = values in this interval not displayed.   Recent Labs  Lab 09/15/21 1525 09/17/21 0418 09/18/21 0345  AST 34 31 28  ALT 28 41 31  ALKPHOS 94 82 78  BILITOT 0.8 0.9 0.7  PROT 5.9* 5.3* 5.1*  ALBUMIN 3.2* 2.6* 2.2*   Recent Labs  Lab 09/15/21 1525 09/15/21 1532 09/17/21 0418 09/18/21 0345 09/19/21 0347 09/20/21 0508 09/21/21 0122  WBC 7.8   < > 7.0 10.9* 10.6* 7.5 5.5  NEUTROABS 4.2  --  4.8  --   --   --   --   HGB 14.5   < > 15.8 13.9 12.5* 11.3* 12.1*  HCT 43.2   < > 46.3 40.5 37.3* 33.2* 36.6*  MCV 98.6   < > 97.3 96.7 97.4 97.1 97.9  PLT 109*   < > 104* 73* 65* 52* 64*   < > = values in this interval not displayed.   No results for input(s):  "CKTOTAL", "CKMB", "CKMBINDEX", "TROPONINI" in the last 168 hours. No results for input(s): "LABPROT", "INR" in the last 72 hours.  No results for input(s): "COLORURINE", "LABSPEC", "PHURINE", "GLUCOSEU", "HGBUR", "BILIRUBINUR", "KETONESUR", "PROTEINUR", "UROBILINOGEN", "NITRITE", "LEUKOCYTESUR" in the last 72 hours.  Invalid input(s): "APPERANCEUR"      Component Value Date/Time   CHOL 133 09/16/2021 0409   TRIG 66 09/19/2021 0347   HDL 59 09/16/2021 0409   CHOLHDL 2.3 09/16/2021 0409   VLDL 8 09/16/2021 0409   LDLCALC 66 09/16/2021 0409   Lab Results  Component Value Date   HGBA1C 5.9 (H) 09/15/2021   No results found for: "LABOPIA", "COCAINSCRNUR", "LABBENZ", "AMPHETMU", "THCU", "LABBARB"  Recent Labs  Lab 09/15/21 1946  ETH <10    I have personally reviewed the radiological images below and agree with  the radiology interpretations.  IR PERCUTANEOUS ART THROMBECTOMY/INFUSION INTRACRANIAL INC DIAG ANGIO  Addendum Date: 09/20/2021   ADDENDUM REPORT: 09/20/2021 16:10 ADDENDUM: Complications: No complications seen. Electronically Signed   By: Frazier Richards M.D.   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 Patient's last known normal was at 1430 NIH stroke scale of 24, modified Rankin scale of 1. TNK was not given as the patient was on apixaban. EXAM: ULTRASOUND-GUIDED VASCULAR ACCESS DIAGNOSTIC CEREBRAL ANGIOGRAM MECHANICAL THROMBECTOMY FLAT PANEL HEAD CT COMPARISON:  None Available. MEDICATIONS: Ancef 2 g IV. The antibiotic was administered within 1 hour of the procedure Performing surgeon: Dr. Frazier Richards Assistant: Dr. Karenann Cai ANESTHESIA/SEDATION: General anesthesia CONTRAST:  Twenty-five mL of Omnipaque 300 milligram/mL FLUOROSCOPY: Radiation Exposure Index (as provided by the fluoroscopic  device): 960 mGy Kerma COMPLICATIONS: SIR Level A - No therapy, no consequence. TECHNIQUE: Informed written consent was obtained from the patient's wife after a thorough discussion of the procedural risks, benefits and alternatives. All questions were addressed. Maximal Sterile Barrier Technique was utilized including caps, mask, sterile gowns, sterile gloves, sterile drape, hand hygiene and skin antiseptic. A timeout was performed prior to the initiation of the procedure. The right groin was prepped and draped in the usual sterile fashion. Using a micropuncture kit and the modified Seldinger technique, access was gained to the right common femoral artery and an 8 [place] by 25 cm sheath was placed. Real-time ultrasound guidance was utilized for vascular access including the acquisition of a permanent ultrasound image documenting patency of the accessed vessel. Under fluoroscopy, a Zoom 88 guide catheter was navigated over a 5 [place] VTK catheter and a 0.035" Terumo Glidewire into the aortic arch. The catheter was placed into the left common carotid artery and then advanced into the left internal carotid artery under roadmap guidance. The diagnostic catheter was removed. Frontal and lateral angiograms of the head were obtained. FINDINGS: 1. Normal caliber of the right common femoral artery, adequate for vascular access. 2. Occlusion of a proximal left M1/MCA. 3. Delayed collaterals from the left ACA to the left MCA vascular tree. PROCEDURE: Using biplane roadmap, a zoom 071 aspiration catheter was navigated over an Aristotle 24 microguidewire into the cavernous segment of the left ICA. The aspiration catheter was then advanced to the level of occlusion at the left M1 MCA and connected to an aspiration pump. Continuous aspiration was performed for 2 minutes. The guide catheter was connected to a VacLok syringe. The aspiration catheter was subsequently removed under constant aspiration. The guide catheter was aspirated  for debris. Then, biplane DSA cerebral angiogram was performed by injecting contrast through the guide catheter which demonstrated complete recanalization of the proximal left MCA and its branches (TICI 3). The MCA and its branches have a normal appearance. No aneurysm, av malformation, dural fistula or vasculitis seen. The venous phase of the cerebral angiogram has a normal appearance with a preferential venous return into the right transverse, sigmoid sinuses and the into the internal jugular vein. Mild narrowing of the left transverse sinus. Flat panel CT of the head was obtained and post processed in a separate workstation with concurrent attending physician supervision. Selected images were sent to PACS. Flat panel CT of the head has a normal appearance without evidence of a bleed. Right common femoral artery angiogram was obtained in right anterior  oblique view. The puncture is at the level of the common femoral artery. The artery has normal caliber, adequate for closure device. The sheath was exchanged over the wire for a Perclose prostyle which was utilized for access closure. Immediate hemostasis was achieved. IMPRESSION: 1. Successful mechanical thrombectomy for treatment of an occlusion of the proximal left M1/MCA. Complete recanalization (TICI 3) of the left MCA was attained. 2. Flat panel CT head was unremarkable. PLAN: 1. Transfer to ICU. 2. Blood pressure goals of 120-140 mm Hg. Electronically Signed: By: Frazier Richards M.D. On: 09/16/2021 10:22   IR US Guide Vasc Access Right  Addendum Date: 09/20/2021   ADDENDUM REPORT: 09/20/2021 74:94 ADDENDUM: Complications: No complications seen. Electronically Signed   By: Frazier Richards M.D.   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 Patient's last known normal was at 1430 NIH  stroke scale of 24, modified Rankin scale of 1. TNK was not given as the patient was on apixaban. EXAM: ULTRASOUND-GUIDED VASCULAR ACCESS DIAGNOSTIC CEREBRAL ANGIOGRAM MECHANICAL THROMBECTOMY FLAT PANEL HEAD CT COMPARISON:  None Available. MEDICATIONS: Ancef 2 g IV. The antibiotic was administered within 1 hour of the procedure Performing surgeon: Dr. Frazier Richards Assistant: Dr. Karenann Cai ANESTHESIA/SEDATION: General anesthesia CONTRAST:  Twenty-five mL of Omnipaque 300 milligram/mL FLUOROSCOPY: Radiation Exposure Index (as provided by the fluoroscopic device): 496 mGy Kerma COMPLICATIONS: SIR Level A - No therapy, no consequence. TECHNIQUE: Informed written consent was obtained from the patient's wife after a thorough discussion of the procedural risks, benefits and alternatives. All questions were addressed. Maximal Sterile Barrier Technique was utilized including caps, mask, sterile gowns, sterile gloves, sterile drape, hand hygiene and skin antiseptic. A timeout was performed prior to the initiation of the procedure. The right groin was prepped and draped in the usual sterile fashion. Using a micropuncture kit and the modified Seldinger technique, access was gained to the right common femoral artery and an 8 [place] by 25 cm sheath was placed. Real-time ultrasound guidance was utilized for vascular access including the acquisition of a permanent ultrasound image documenting patency of the accessed vessel. Under fluoroscopy, a Zoom 88 guide catheter was navigated over a 5 [place] VTK catheter and a 0.035" Terumo Glidewire into the aortic arch. The catheter was placed into the left common carotid artery and then advanced into the left internal carotid artery under roadmap guidance. The diagnostic catheter was removed. Frontal and lateral angiograms of the head were obtained. FINDINGS: 1. Normal caliber of the right common femoral artery, adequate for vascular access. 2. Occlusion of a proximal left M1/MCA.  3. Delayed collaterals from the left ACA to the left MCA vascular tree. PROCEDURE: Using biplane roadmap, a zoom 071 aspiration catheter was navigated over an Aristotle 24 microguidewire into the cavernous segment of the left ICA. The aspiration catheter was then advanced to the level of occlusion at the left M1 MCA and connected to an aspiration pump. Continuous aspiration was performed for 2 minutes. The guide catheter was connected to a VacLok syringe. The aspiration catheter was subsequently removed under constant aspiration. The guide catheter was aspirated for debris. Then, biplane DSA cerebral angiogram was performed by injecting contrast through the guide catheter which demonstrated complete recanalization of the proximal left MCA and its branches (TICI 3). The MCA and its branches have a normal appearance. No aneurysm,  av malformation, dural fistula or vasculitis seen. The venous phase of the cerebral angiogram has a normal appearance with a preferential venous return into the right transverse, sigmoid sinuses and the into the internal jugular vein. Mild narrowing of the left transverse sinus. Flat panel CT of the head was obtained and post processed in a separate workstation with concurrent attending physician supervision. Selected images were sent to PACS. Flat panel CT of the head has a normal appearance without evidence of a bleed. Right common femoral artery angiogram was obtained in right anterior oblique view. The puncture is at the level of the common femoral artery. The artery has normal caliber, adequate for closure device. The sheath was exchanged over the wire for a Perclose prostyle which was utilized for access closure. Immediate hemostasis was achieved. IMPRESSION: 1. Successful mechanical thrombectomy for treatment of an occlusion of the proximal left M1/MCA. Complete recanalization (TICI 3) of the left MCA was attained. 2. Flat panel CT head was unremarkable. PLAN: 1. Transfer to ICU. 2.  Blood pressure goals of 120-140 mm Hg. Electronically Signed: By: Frazier Richards M.D. On: 09/16/2021 10:22   IR CT Head Ltd  Addendum Date: 09/20/2021   ADDENDUM REPORT: 09/20/2021 26:83 ADDENDUM: Complications: No complications seen. Electronically Signed   By: Frazier Richards M.D.   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 Patient's last known normal was at 1430 NIH stroke scale of 24, modified Rankin scale of 1. TNK was not given as the patient was on apixaban. EXAM: ULTRASOUND-GUIDED VASCULAR ACCESS DIAGNOSTIC CEREBRAL ANGIOGRAM MECHANICAL THROMBECTOMY FLAT PANEL HEAD CT COMPARISON:  None Available. MEDICATIONS: Ancef 2 g IV. The antibiotic was administered within 1 hour of the procedure Performing surgeon: Dr. Frazier Richards Assistant: Dr. Karenann Cai ANESTHESIA/SEDATION: General anesthesia CONTRAST:  Twenty-five mL of Omnipaque 300 milligram/mL FLUOROSCOPY: Radiation Exposure Index (as provided by the fluoroscopic device): 419 mGy Kerma COMPLICATIONS: SIR Level A - No therapy, no consequence. TECHNIQUE: Informed written consent was obtained from the patient's wife after a thorough discussion of the procedural risks, benefits and alternatives. All questions were addressed. Maximal Sterile Barrier Technique was utilized including caps, mask, sterile gowns, sterile gloves, sterile drape, hand hygiene and skin antiseptic. A timeout was performed prior to the initiation of the procedure. The right groin was prepped and draped in the usual sterile fashion. Using a micropuncture kit and the modified Seldinger technique, access was gained to the right common femoral artery and an 8 [place] by 25 cm sheath was placed. Real-time ultrasound guidance was utilized for vascular access including the acquisition of a permanent ultrasound  image documenting patency of the accessed vessel. Under fluoroscopy, a Zoom 88 guide catheter was navigated over a 5 [place] VTK catheter and a 0.035" Terumo Glidewire into the aortic arch. The catheter was placed into the left common carotid artery and then advanced into the left internal carotid artery under roadmap guidance. The diagnostic catheter was removed. Frontal and lateral angiograms of the head were obtained. FINDINGS: 1. Normal caliber of the right common femoral artery, adequate for vascular access. 2. Occlusion of a proximal left M1/MCA. 3. Delayed collaterals from the left ACA to the left MCA vascular tree. PROCEDURE: Using biplane roadmap, a zoom 071 aspiration catheter was navigated over an Aristotle 24 microguidewire into the cavernous segment of the left ICA. The  aspiration catheter was then advanced to the level of occlusion at the left M1 MCA and connected to an aspiration pump. Continuous aspiration was performed for 2 minutes. The guide catheter was connected to a VacLok syringe. The aspiration catheter was subsequently removed under constant aspiration. The guide catheter was aspirated for debris. Then, biplane DSA cerebral angiogram was performed by injecting contrast through the guide catheter which demonstrated complete recanalization of the proximal left MCA and its branches (TICI 3). The MCA and its branches have a normal appearance. No aneurysm, av malformation, dural fistula or vasculitis seen. The venous phase of the cerebral angiogram has a normal appearance with a preferential venous return into the right transverse, sigmoid sinuses and the into the internal jugular vein. Mild narrowing of the left transverse sinus. Flat panel CT of the head was obtained and post processed in a separate workstation with concurrent attending physician supervision. Selected images were sent to PACS. Flat panel CT of the head has a normal appearance without evidence of a bleed. Right common femoral  artery angiogram was obtained in right anterior oblique view. The puncture is at the level of the common femoral artery. The artery has normal caliber, adequate for closure device. The sheath was exchanged over the wire for a Perclose prostyle which was utilized for access closure. Immediate hemostasis was achieved. IMPRESSION: 1. Successful mechanical thrombectomy for treatment of an occlusion of the proximal left M1/MCA. Complete recanalization (TICI 3) of the left MCA was attained. 2. Flat panel CT head was unremarkable. PLAN: 1. Transfer to ICU. 2. Blood pressure goals of 120-140 mm Hg. Electronically Signed: By: Frazier Richards M.D. On: 09/16/2021 10:22   DG Abd Portable 1V  Result Date: 09/19/2021 CLINICAL DATA:  Ileus. EXAM: PORTABLE ABDOMEN - 1 VIEW COMPARISON:  One-view abdomen 09/18/2021 FINDINGS: Previously noted dilated loops of small bowel have normalized. No obstruction or free air is present. Degenerative changes are present in the lower lumbar spine. IMPRESSION: 1. Interval resolution of dilated loops of small bowel. 2. No acute abnormality. Electronically Signed   By: San Morelle M.D.   On: 09/19/2021 14:54   DG Abd Portable 1V  Result Date: 09/18/2021 CLINICAL DATA:  Abdominal distention. EXAM: PORTABLE ABDOMEN - 1 VIEW COMPARISON:  09/16/2021. FINDINGS: Flat plate study in 2 films at 4:54 a.m., 09/18/2021. There is increased opacity in the left lung base consistent with consolidation, atelectasis or aspiration. Enteric catheter has been advanced with tip now superimposing over the left iliac crest. If the patient has had gastric bypass with gastrojejunostomy the tube is probably in a jejunal segment. Postsurgical changes are again noted in the left and right upper quadrant. There is improved gaseous distention of central abdominal large bowel segments. Currently no dilated small bowel is seen. Retained fecal burden appears somewhat improved particularly in the ascending and  descending colon. Colonic gas remains present through into the rectum. Visceral shadows are stable. There is no supine evidence of free air. In all other respects there are no further changes. IMPRESSION: 1. Increasingly dense consolidation in the left lower lobe consistent with atelectasis, pneumonia or aspiration. 2. Improved colonic distention, currently with no small bowel dilatation being seen. 3. Interval advancement of the enteric tube with the tip superimposing over the left iliac crest. If there has been a gastric bypass, the tip of the tube is probably in a jejunal segment past the gastrojejunostomy. Electronically Signed   By: Telford Nab M.D.   On: 09/18/2021 07:07   DG  Chest Port 1 View  Result Date: 09/17/2021 CLINICAL DATA:  Hypoxia. 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 Endotracheal tube terminates at the level of the clavicles, well above the carina. Right PICC terminates over the SVC, unchanged. Enteric tube courses into the abdomen with tip not imaged. The cardiac silhouette remains mildly enlarged. Left perihilar and left lower lobe opacities are unchanged. No sizable pleural effusion or pneumothorax is identified. IMPRESSION: Unchanged left perihilar and lower lobe airspace disease concerning for pneumonia. Electronically Signed   By: Logan Bores M.D.   On: 09/17/2021 08:54   ECHOCARDIOGRAM COMPLETE  Result Date: 09/16/2021    ECHOCARDIOGRAM REPORT   Patient Name:   [facility] Date of Exam: 09/16/2021 Medical Rec #:  [id number]      Height:       73.0 in Accession #:    [id number]     Weight:       165.8 lb Date of Birth:  1941/01/04      BSA:          1.987 m Patient Age:    33 years       BP:           102/60 mmHg Patient Gender: M              HR:           63 bpm. Exam Location:  Inpatient Procedure: 2D Echo, Cardiac Doppler and Color Doppler Indications:    Stroke  History:         Patient has no prior history of Echocardiogram examinations.                 Aortic Valve Disease, Arrythmias:Atrial Fibrillation; Risk                 Factors:Former Smoker and Diabetes. AVR. Acute respiratory                 failure with hypoxia. HFrEF.                 Aortic Valve: valve is present in the aortic position. Procedure                 Date: unknown.  Sonographer:    Clayton Lefort RDCS (AE) Referring Phys: [id number] [place]  Sonographer Comments: Echo performed with patient supine and on artificial respirator. Unable to use Definity due to lack of line access per nurse. IMPRESSIONS  1. Left ventricular ejection fraction, by estimation, is 20 to 25%. The left ventricle has severely decreased function. The left ventricle demonstrates global hypokinesis. Left ventricular diastolic parameters are consistent with Grade I diastolic dysfunction (impaired relaxation).  2. Right ventricular systolic function is mildly reduced. The right ventricular size is mildly enlarged.  3. Left atrial size was mildly dilated.  4. Right atrial size was severely dilated.  5. The mitral valve is normal in structure. No evidence of mitral valve regurgitation. No evidence of mitral stenosis.  6. 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 The aortic valve has been repaired/replaced. Aortic valve regurgitation is not visualized. No aortic stenosis is present. There is a valve present in the aortic position. Procedure Date: unknown. Aortic valve mean gradient measures 8.0 mmHg. Aortic valve Vmax measures 2.00 m/s.  7. The inferior vena cava is normal in size with greater than 50% respiratory variability, suggesting right atrial pressure of  3 mmHg. FINDINGS  Left Ventricle: Left ventricular ejection fraction, by estimation, is 20 to 25%. The left ventricle has severely decreased function. The left ventricle demonstrates global hypokinesis. The left ventricular internal cavity size was normal  in size. There is no left ventricular hypertrophy. Left ventricular diastolic parameters are consistent with Grade I diastolic dysfunction (impaired relaxation). Right Ventricle: The right ventricular size is mildly enlarged. No increase in right ventricular wall thickness. Right ventricular systolic function is mildly reduced. Left Atrium: Left atrial size was mildly dilated. Right Atrium: Right atrial size was severely dilated. Pericardium: There is no evidence of pericardial effusion. Mitral Valve: The mitral valve is normal in structure. Mild to moderate mitral annular calcification. No evidence of mitral valve regurgitation. No evidence of mitral valve stenosis. Tricuspid Valve: The tricuspid valve is normal in structure. Tricuspid valve regurgitation is not demonstrated. No evidence of tricuspid stenosis. 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 The aortic valve has been repaired/replaced. Aortic valve regurgitation is not visualized. No aortic stenosis is present. Aortic valve mean gradient measures 8.0 mmHg. Aortic valve peak gradient measures 16.0 mmHg. Aortic valve area, by VTI measures 1.10 cm. There is a valve present in the aortic position. Procedure Date: unknown. Pulmonic Valve: The pulmonic valve was not well visualized. Pulmonic valve regurgitation is trivial. No evidence of pulmonic stenosis. Aorta: The aortic root is normal in size and structure. Venous: The inferior vena cava is normal in size with greater than 50% respiratory variability, suggesting right atrial pressure of 3 mmHg. IAS/Shunts: No atrial level shunt detected by color flow Doppler.  LEFT VENTRICLE PLAX 2D LVIDd:         5.30 cm      Diastology LVIDs:         4.90 cm      LV e' medial:    4.13 cm/s LV PW:         1.20 cm      LV E/e' medial:  19.8 LV IVS:        2.00 cm      LV e' lateral:   9.14 cm/s LVOT diam:     2.00 cm      LV E/e' lateral: 8.9 LV SV:         38 LV SV Index:    19 LVOT Area:     3.14 cm  LV Volumes (MOD) LV vol d, MOD A2C: 158.0 ml LV vol d, MOD A4C: 170.0 ml LV vol s, MOD A2C: 94.3 ml LV vol s, MOD A4C: 139.0 ml LV SV MOD A2C:     63.7 ml LV SV MOD A4C:     170.0 ml LV SV MOD BP:      53.6 ml RIGHT VENTRICLE RV Basal diam:  3.20 cm RV S prime:     6.74 cm/s TAPSE (M-mode): 0.8 cm LEFT ATRIUM             Index        RIGHT ATRIUM           Index LA diam:        3.90 cm 1.96 cm/m   RA Area:     25.90 cm LA Vol (A2C):   66.4 ml 33.42 ml/m  RA Volume:   85.90 ml  43.24 ml/m LA Vol (A4C):   56.6 ml 28.49 ml/m LA Biplane Vol: 66.5 ml 33.47 ml/m  AORTIC VALVE AV Area (Vmax):    1.20 cm  AV Area (Vmean):   1.25 cm AV Area (VTI):     1.10 cm AV Vmax:           200.00 cm/s AV Vmean:          131.000 cm/s AV VTI:            0.349 m AV Peak Grad:      16.0 mmHg AV Mean Grad:      8.0 mmHg LVOT Vmax:         76.40 cm/s LVOT Vmean:        52.300 cm/s LVOT VTI:          0.122 m LVOT/AV VTI ratio: 0.35  AORTA Ao Root diam: 2.70 cm Ao Asc diam:  4.00 cm MITRAL VALVE MV Area (PHT): 4.06 cm    SHUNTS MV Decel Time: 187 msec    Systemic VTI:  0.12 m MV E velocity: 81.80 cm/s  Systemic Diam: 2.00 cm Kardie Tobb DO Electronically signed by Berniece Salines DO Signature Date/Time: 09/16/2021/5:54:30 PM    Final    DG CHEST PORT 1 VIEW  Result Date: 09/16/2021 CLINICAL DATA:  PICC line placement. EXAM: PORTABLE CHEST 1 VIEW COMPARISON:  Chest x-ray 09/16/2021 FINDINGS: Endotracheal tube tip is 5.2 cm above the carina. Enteric tube extends below the diaphragm. There is a new right upper extremity PICC terminating in the SVC. Patient is status post cardiac surgery and valve replacement, unchanged. Cardiac silhouette is enlarged. There is some increasing retrocardiac opacities. No pleural effusion or pneumothorax identified. Osseous structures are stable. IMPRESSION: 1. Right upper extremity PICC terminates in the SVC. 2. Increasing retrocardiac atelectasis/airspace disease. 3. Stable  cardiomegaly. Electronically Signed   By: Ronney Asters M.D.   On: 09/16/2021 15:30   [place] EKG SITE RITE  Result Date: 09/16/2021 If Site Rite image not attached, placement could not be confirmed due to current cardiac rhythm.  DG Abd Portable 1V  Addendum Date: 09/16/2021   ADDENDUM REPORT: 09/16/2021 08:27 ADDENDUM: Study discussed by telephone with Nurse Apolonio Schneiders on 09/16/2021 at 0823 hours. Electronically Signed   By: Genevie Ann M.D.   On: 09/16/2021 08:27   Result Date: 09/16/2021 CLINICAL DATA:  81 year old male with projectile vomiting. EXAM: PORTABLE ABDOMEN - 1 VIEW COMPARISON:  09/15/2021. FINDINGS: Portable AP supine view at 0756 hours. Enteric tube appears pulled back slightly, side hole now at the expected level of the GEJ. Stable epigastric and right upper quadrant surgical clips. Negative visible lung bases. Increasing gas-filled bowel loops throughout the abdomen and pelvis, superimposed on retained stool in the colon. Possible dilated small bowel now. But gas is present to the rectum. No definite pneumoperitoneum on these supine views. No acute osseous abnormality identified. IMPRESSION: 1. Enteric tube has been pulled back, side hole now at the level of the GEJ. Recommend advancing 6 cm to ensure side hole placement inside the stomach. 2. Increasing gas-filled bowel throughout the abdomen and pelvis, most suggestive of Acute Ileus. Electronically Signed: By: Genevie Ann M.D. On: 09/16/2021 08:18   DG CHEST PORT 1 VIEW  Result Date: 09/16/2021 CLINICAL DATA:  81 year old male with history of chest congestion. EXAM: PORTABLE CHEST 1 VIEW COMPARISON:  Chest x-ray 09/16/2021. FINDINGS: An endotracheal tube is in place with tip 7.4 cm above the carina. A nasogastric tube is seen extending into the stomach, however, the tip of the nasogastric tube extends below the lower margin of the image. Lung volumes are normal. No consolidative airspace disease. No pleural effusions.  No pneumothorax. No pulmonary  nodule or mass noted. Pulmonary vasculature and the cardiomediastinal silhouette are within normal limits. 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 IMPRESSION: 1. Support apparatus, as above. 2. No radiographic evidence of acute cardiopulmonary disease. 3. Aortic atherosclerosis. Electronically Signed   By: Vinnie Langton M.D.   On: 09/16/2021 08:12   DG Chest Port 1 View  Result Date: 09/16/2021 CLINICAL DATA:  81 year old male with history of stroke. EXAM: PORTABLE CHEST 1 VIEW COMPARISON:  Chest x-ray 09/15/2021. FINDINGS: An endotracheal tube is in place with tip 7.0 cm above the carina. A nasogastric tube is seen extending into the stomach, however, the tip of the nasogastric tube extends below the lower margin of the image. Lung volumes are normal. No consolidative airspace disease. No pleural effusions. No pneumothorax. No evidence of pulmonary edema. Heart size is mildly enlarged. Upper mediastinal contours are within normal limits. Atherosclerotic calcifications are noted in the thoracic aorta. Status post median sternotomy for aortic valve replacement (a stented bioprosthesis is noted). IMPRESSION: 1. Postoperative changes and support apparatus, as above. 2. No radiographic evidence of acute cardiopulmonary disease. 3. Mild cardiomegaly. 4. Aortic atherosclerosis. Electronically Signed   By: Vinnie Langton M.D.   On: 09/16/2021 05:50   MR ANGIO HEAD WO CONTRAST  Result Date: 09/16/2021 CLINICAL DATA:  81 year old male with paroxysmal atrial fibrillation on Eliquis. New onset aphasia, right side weakness. Code stroke presentation yesterday. Left M1 occlusion by CTA. EXAM: MRA HEAD WITHOUT CONTRAST TECHNIQUE: Angiographic images of the Circle of Willis were acquired using MRA technique without intravenous contrast. COMPARISON:  MRI brain today reported separately. CTA head and neck yesterday. FINDINGS: Anterior  circulation: Antegrade flow in both ICA siphons with bilateral siphon irregularity in keeping with the atherosclerosis and stenosis better demonstrated by CTA. Carotid termini remain patent. And now the left MCA M1 segment, anterior temporal artery origin, and left MCA bifurcation are patent. Left MCA branches appear not significantly changed from yesterday, including a long segment M2 stenosis demonstrated in the posterior division on series 1035, image 4. Right MCA M1 segment is patent but with moderate to severe stenosis upstream of the right MCA bifurcation, concordant with CTA appearance yesterday. Likewise, right MCA branches appear stable with mild irregularity. Anterior communicating artery and visible ACA branches are stable, mild right A2 stenosis. Posterior circulation: Antegrade flow in the dominant distal left vertebral artery with patent left PICA origin. No distal left vertebral stenosis. Asymmetrically decreased flow in the smaller distal right vertebral artery, which did appear atherosclerotic and stenotic on the CTA yesterday but was patent at that time to the vertebrobasilar junction. Right PICA origin appears to remain patent. Patent basilar artery without stenosis. Patent SCA and PCA origins. Bilateral PCA branches appear stable, within normal limits. Anatomic variants: Dominant left vertebral V4 segment. Other: Brain MRI reported separately today. IMPRESSION: 1. Recanalized Left MCA M1 segment. Patent left MCA bifurcation. Upstream ICA atherosclerosis and stenosis, and downstream MCA M2 branch stenosis appears unchanged from the CTA yesterday. 2. Elsewhere stable intracranial atherosclerosis and stenosis, most notably both ICA siphons, Right MCA bifurcation, distal Right Vertebral Artery. Electronically Signed   By: Genevie Ann M.D.   On: 09/16/2021 05:49   MR BRAIN WO CONTRAST  Result Date: 09/16/2021 CLINICAL DATA:  81 year old male with paroxysmal atrial fibrillation on Eliquis. New onset  aphasia, right side weakness. Code stroke presentation yesterday. Left M1 occlusion. EXAM: MRI HEAD WITHOUT CONTRAST TECHNIQUE:  Multiplanar, multiecho pulse sequences of the brain and surrounding structures were obtained without intravenous contrast. COMPARISON:  CT head and CTA head and neck yesterday. FINDINGS: Brain: Patchy restricted diffusion throughout the left basal ganglia including the caudate and the lentiform. There is some involvement of the posterior limb of the left external capsule. And there is some mesial temporal lobe involvement on series 5, image 73. But otherwise Insula and other left MCA territory cortex seems spared. No contralateral right hemisphere or posterior fossa restricted diffusion. Multiple chronic generally small cerebellar infarcts on the right. Patchy and confluent bilateral cerebral white matter T2 and FLAIR hyperintensity with a small area of chronic left middle frontal gyrus encephalomalacia (series 10, image 19). There are several chronic microhemorrhages also in the left frontal lobe (series 22, image 35). No midline shift, mass effect, evidence of mass lesion, ventriculomegaly, extra-axial collection or acute intracranial hemorrhage. Cervicomedullary junction and pituitary are within normal limits. Vascular: Major intracranial vascular flow voids are preserved except for the distal right vertebral artery, which was non dominant but patent by CTA yesterday. Skull and upper cervical spine: Negative. Visualized bone marrow signal is within normal limits. Sinuses/Orbits: Postoperative changes to both globes. Paranasal Visualized paranasal sinuses and mastoids are stable and well aerated. Other: Intubated. Small volume fluid in the pharynx and nasal cavity. Grossly normal visible internal auditory structures. IMPRESSION: 1. Patchy acute infarct in the left basal ganglia and the left mesial temporal lobe with no associated hemorrhage or mass effect. 2. Underlying chronic small and  medium-sized vessel ischemia. 3. MRA Head reported separately today. Electronically Signed   By: Genevie Ann M.D.   On: 09/16/2021 05:23   DG Abd Portable 1V  Result Date: 09/15/2021 CLINICAL DATA:  OG tube placement. EXAM: PORTABLE ABDOMEN - 1 VIEW COMPARISON:  None Available. FINDINGS: Tip and side port of the enteric tube below the diaphragm in the stomach. Enteric chain sutures in the left upper quadrant as well as multiple surgical clips. Surgical clips in the right upper quadrant. No bowel dilatation or evidence of obstruction in the included upper abdomen. IMPRESSION: Tip and side port of the enteric tube below the diaphragm in the stomach. Electronically Signed   By: Keith Rake M.D.   On: 09/15/2021 21:23   DG CHEST PORT 1 VIEW  Result Date: 09/15/2021 CLINICAL DATA:  Intubation. EXAM: PORTABLE CHEST 1 VIEW COMPARISON:  10/02/2020 FINDINGS: Endotracheal tube tip 5.4 cm from the carina at the level of the clavicular heads. Enteric tube in place with tip below the diaphragm not included on this chest field of view. Prior median sternotomy prosthetic aortic valve. Cardiomegaly. Hazy opacity at the left greater than right lung base likely represents combination of atelectasis and pleural effusions. Vascular congestion. No pneumothorax. IMPRESSION: 1. Endotracheal tube tip 5.4 cm from the carina at the level of the clavicular heads. Enteric tube in place with tip below the diaphragm. 2. Cardiomegaly. Vascular congestion. Hazy bibasilar lung opacities likely combination of pleural effusions and atelectasis. Suspect CHF. Electronically Signed   By: Keith Rake M.D.   On: 09/15/2021 21:21   CT ANGIO HEAD NECK W WO CM (CODE STROKE)  Result Date: 09/15/2021 CLINICAL DATA:  Provided history: Neuro deficit, acute, stroke suspected; right-sided weakness and facial droop. EXAM: CT ANGIOGRAPHY HEAD AND NECK TECHNIQUE: Multidetector CT imaging of the head and neck was performed using the standard protocol  during bolus administration of intravenous contrast. Multiplanar CT image reconstructions and MIPs were obtained to evaluate the vascular  anatomy. Carotid stenosis measurements (when applicable) are obtained utilizing NASCET criteria, using the distal internal carotid diameter as the denominator. RADIATION DOSE REDUCTION: This exam was performed according to the departmental dose-optimization program which includes automated exposure control, adjustment of the mA and/or kV according to patient size and/or use of iterative reconstruction technique. CONTRAST:  85m OMNIPAQUE IOHEXOL 350 MG/ML SOLN COMPARISON:  Noncontrast head CT performed immediately prior 09/15/2021. FINDINGS: CTA NECK FINDINGS Aortic arch: Standard aortic branching. Atherosclerotic plaque within the visualized aortic arch and proximal major branch vessels of the neck. No hemodynamically significant innominate or proximal subclavian artery stenosis. Right carotid system: CCA and ICA patent within the neck. Mild scattered atherosclerotic plaque within the proximal to mid CCA. Prominent predominantly calcified plaque about the carotid bifurcation and within the proximal ICA. Resultant in 50-60% stenosis at the origin of the right ICA. Left carotid system: CCA and ICA patent within the neck. Mild atherosclerotic plaque scattered within the proximal to mid CCA. Prominent atherosclerotic plaque about the carotid bifurcation and within the proximal ICA. Resultant 40% stenosis of the proximal ICA. Vertebral arteries: Vertebral arteries patent within the neck. The left vertebral artery is dominant. Severe atherosclerotic stenosis at the origin of the right vertebral artery. Mild atherosclerotic narrowing at the origin of the left vertebral artery. Skeleton: Cervical spondylosis. No acute fracture or aggressive osseous lesion. Other neck: No neck mass or cervical lymphadenopathy. Upper chest: Prior median sternotomy. Smooth interlobular septal thickening  within the imaged lung apices, likely reflecting interstitial edema. Partially imaged right pleural effusion. Review of the MIP images confirms the above findings CTA HEAD FINDINGS Anterior circulation: The intracranial internal carotid arteries are patent. Atherosclerotic plaque within both vessels. Most notably, there is moderate atherosclerotic narrowing of the distal cavernous/paraclinoid right ICA, and severe stenosis of the distal cavernous/paraclinoid left ICA. The left M1 MCA segment is occluded at its origin. There is some reconstitution of enhancement within M2 and more distal left MCA vessels. The right M1 segment is patent. Atherosclerotic irregularity of the M2 and more distal right MCA vessels. No appreciable right M2 proximal branch occlusion or high-grade proximal stenosis. The anterior cerebral arteries are patent. Posterior circulation: The intracranial vertebral arteries are patent. Nonstenotic atherosclerotic plaque within the non dominant V4 right vertebral artery. The basilar artery is patent. The posterior cerebral arteries are patent. Atherosclerotic irregularity of both vessels without high-grade proximal stenosis. Posterior communicating arteries are diminutive or absent, bilaterally. Venous sinuses: Within the limitations of contrast timing, no convincing thrombus. Anatomic variants: As described. Review of the MIP images confirms the above findings CTA head impression #1 called by telephone at the time of interpretation on 09/15/2021 at 3:50 pm to provider HIsla Pence who verbally acknowledged these results. IMPRESSION: CTA neck: 1. The common carotid, internal carotid and vertebral arteries are patent within the neck. 2. Atherosclerotic narrowing at the origin of the right ICA of 50-60%. 3. Atherosclerotic narrowing at the origin of the left ICA of 40%. 4. Severe stenosis at the origin of the non-dominant right vertebral artery. 5. Mild atherosclerotic narrowing at the origin of the  dominant left vertebral artery. 6.  Aortic Atherosclerosis (ICD10-I70.0). 7. Interstitial edema within the imaged lung apices. 8. Partially imaged right pleural effusion. CTA head: 1. The M1 segment of the left middle cerebral artery is occluded at its origin. There is some reconstitution of enhancement within M2 and more distal left MCA vessels, likely due to collateral flow. 2. Additional intracranial atherosclerotic disease with multifocal stenoses, most notably  as follows. 3. Severe stenosis of the distal cavernous/paraclinoid left ICA. 4. Moderate stenosis of the distal cavernous/paraclinoid right ICA. Electronically Signed   By: Kellie Simmering D.O.   On: 09/15/2021 16:20   CT HEAD CODE STROKE WO CONTRAST  Result Date: 09/15/2021 CLINICAL DATA:  Code stroke. Neuro deficit, acute, stroke suspected. Right-sided weakness and facial droop. EXAM: CT HEAD WITHOUT CONTRAST TECHNIQUE: Contiguous axial images were obtained from the base of the skull through the vertex without intravenous contrast. RADIATION DOSE REDUCTION: This exam was performed according to the departmental dose-optimization program which includes automated exposure control, adjustment of the mA and/or kV according to patient size and/or use of iterative reconstruction technique. COMPARISON:  No pertinent prior exams available for comparison. FINDINGS: Brain: Mild generalized cerebral atrophy. Suspected subtle loss of gray-white differentiation within the left insula (for instance as seen on series 2, image 18). Small left MCA territory cortical/subcortical infarct within the left parietal lobe (involving the left postcentral gyrus). This is favored chronic. Background mild patchy and ill-defined hypoattenuation within the cerebral white matter, nonspecific but compatible chronic small vessel ischemic disease. This includes a chronic lacunar infarct within the right centrum semiovale/corona radiata. Small chronic infarcts within the right cerebellar  hemisphere. There is no acute intracranial hemorrhage. No extra-axial fluid collection. No evidence of an intracranial mass. No midline shift. Vascular: Hyperdense M1 left MCA segment, highly suspicious for presence of endoluminal thrombus. Skull: No fracture or aggressive osseous lesion. Sinuses/Orbits: No mass or acute finding within the imaged orbits. Small mucous retention cyst versus polyp within the left maxillary sinus. Minimal mucosal thickening within the right sphenoid sinus and scattered within the bilateral ethmoid sinuses. ASPECTS ([place] Stroke Program Early CT Score) - Ganglionic level infarction (caudate, lentiform nuclei, internal capsule, insula, M1-M3 cortex): 6 - Supraganglionic infarction (M4-M6 cortex): 3 Total score (0-10 with 10 being normal): 9 Critical/emergent results were called by telephone at the time of interpretation on 09/15/2021 at 3:45 pm to provider JULIE HAVILAND , who verbally acknowledged these results. IMPRESSION: 1. Hyperdense left M1 MCA segment highly suspicious for the presence of endoluminal thrombus. 2. Suspected subtle acute infarct involving the left insula. ASPECTS is 9. 3. Small left MCA territory cortically-based infarct within the left parietal lobe (with involvement of the postcentral gyrus), favored chronic. 4. Background mild chronic small ischemic changes within the cerebral white matter. This includes a chronic lacunar infarct within the right centrum semiovale/corona radiata. 5. 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 6. Mild generalized parenchymal atrophy. Electronically Signed   By: Kellie Simmering D.O.   On: 09/15/2021 15:50     PHYSICAL EXAM  Temp:  [97.8 F (36.6 C)-98.3 F (36.8 C)] 98.2 F (36.8 C) (06/21 1226) Pulse Rate:  [43-73] 44 (06/21 1226) Resp:  [18-19] 18 (06/21 1226) BP: (122-150)/(70-78) 122/70 (06/21 1226) SpO2:  [93 %-96 %] 93 % (06/21 1226)  General -frail elderly Caucasian male.  Not in  distress.  Ophthalmologic - fundi not visualized due to noncooperation.  Cardiovascular - irregularly irregular heart rate and rhythm.  Neuro -awake and alert and interactive able to follow all simple commands, no aphasia but paucity of speech, able to name and repeat in dysarthric voice. Eyes mid position, visual field full, no gaze palsy, PERRL. Mild right facial droop and tongue protrusion not complete. BUE 3/5 and slight drift bilaterally, b/l LE at least 3-/5 bilaterally symmetrical. DTR 1+ and no babinski. Sensation symmetrical, coordination b/l FTN grossly intact but slow on action,  and gait not tested.    ASSESSMENT/PLAN [name] is a 81 y.o. male with history of CHF, A-fib on Eliquis CKD, hypertension, diabetes, CHF, PE in 2022, chronic DVT, aortic stenosis status post AVR admitted for aphasia, right-sided weakness, right facial droop. No tPA given due to on Eliquis.    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 IR with left M1 occlusion s/p TICI3 reperfusion MRI left BG, CR and mesial temporal lobe small infarcts MRA left M1 occlusion recannulized, patent left MCA.  Stable intracranial atherosclerosis and stenosis most notably both ICA siphon, right MCA bifurcation and distal right VA 2D Echo EF 20 to 25% LDL 66 HgbA1c 5.9 Heparin subcu for VTE prophylaxis Eliquis (apixaban) daily prior to admission, now on   Eliquis Ongoing aggressive stroke risk factor management Therapy recommendations: CIR Disposition: Pending  Respiratory failure Aspiration Intubated for procedure and airway protection Had a massive  aspiration 6/15  On Rocephin Extubated 6/17 and tolerating well CCM on board  Chronic A-fib On Eliquis at home Rate controlled Also on amiodarone and metoprolol Follow-up with Dr. Jac Canavan at [facility] now on heparin IV, transition to po once po access  Cardiomyopathy EF 20 to 25% Follows with Dr. Jac Canavan at [facility] Not on significant heart failure regimen given hypotension and bradycardia. now on heparin IV, transition to po once po access  Ileus, improving Likely the cause of large vomitus with aspiration Bowel rest NPO now CCM on board  Diabetes HgbA1c 5.9 goal < 7.0 Controlled CBG monitoring SSI DM education and close PCP follow up  History of hypertension Hypotension BP on the low end On Levophed CCM on board Long term BP goal normotensive  Lipid management Home meds: None LDL 66, goal < 70 No statin for now given advanced age and LDL at goal as well as no po access  AKI on CKD CKD 3, creatinine 1.60-1.39->2.43->1.54 CCM on board Avoid nephrotoxic meds  Other Stroke Risk Factors Advanced age PE in 2022 Chronic DVT  Other Active Problems AS s/p bioprosthetic aortic valve    Hospital day # 7 Plan mobilize out of bed.  Continue ongoing therapies.  Transfer to rehab later today when bed available..  Discussed with patient, family, and answered questions.  Follow-up as outpatient stroke clinic in 2 months      Antony Contras, MD Stroke Neurology 09/22/2021 1:30 PM    To contact Stroke Continuity provider, please refer to [email or web address] After hours, contact General Neurology

## 2021-09-22 NOTE — Progress Notes (Signed)
Inpatient Rehabilitation Admissions Coordinator   I met at bedside with patient and wife. I await insurance approval and bed availability to admit to CIR.  Danne Baxter, RN, MSN Rehab Admissions Coordinator [phone] 09/22/2021 11:07 AM

## 2021-09-22 NOTE — H&P (Incomplete)
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  His wife alerted EMS.  He is maintained on Eliquis secondary to history of PE, chronic DVT and chronic A-fib.  He was therefore not a candidate for TNK.  CT head and CT angio head performed with evidence of hyperdense left M1 MCA segment highly suspicious for the presence of endoluminal thrombus.  Dr. Theda Sers, neurology was consulted and interventional neuroradiology consulted regarding mechanical thrombectomy.  He underwent diagnostic cerebral angiogram on 6/14 with mechanical thrombectomy with complete recanalization of the MCA.  He was intubated for the procedure and vomited during pressure support and developed aspiration pneumonitis.  Developed acute respiratory failure with hypoxemia.  Required Levophed and heparin IV.  2D echo performed revealed ejection fraction estimated 20 to 25%.  His hemoglobin A1c is 5.9.  Given heparin subcutaneously for VTE prophylaxis.  Initially given aspirin 325 mg daily, then transitioned from IV heparin to Eliquis.  Remained on Rocephin for aspiration pneumonia.  He developed an ileus and was kept on bowel rest with orogastric tube to suction.  MRI of the brain performed 6/15 revealed patchy acute infarct in the left basal ganglia and left medial temporal lobe, recanalized left MCA M1 segment and patent left MCA bifurcation.  He improved over the next several days and was off vasopressors and following commands. He was subsequently extubated on 6/17.  He was transferred out of the ICU on 6/19. The patient requires inpatient medicine and rehabilitation evaluations and services for ongoing dysfunction secondary to left MCA infarcts.   ROS     Past Medical History:   Diagnosis Date   Aortic stenosis     Chronic renal impairment, stage 3 (moderate), unspecified whether stage 3a or 3b CKD ([name])     Diabetes ([name])     HFrEF (heart failure with reduced ejection fraction) ([name])     Hypothyroidism     PE (pulmonary thromboembolism) ([place]) 2022         Past Surgical History:  Procedure Laterality Date   AORTIC VALVE REPLACEMENT       CHOLECYSTECTOMY       IR CT HEAD LTD   09/15/2021   IR PERCUTANEOUS ART THROMBECTOMY/INFUSION INTRACRANIAL INC DIAG ANGIO   09/15/2021   IR US GUIDE VASC ACCESS RIGHT   09/15/2021   RADIOLOGY WITH ANESTHESIA N/A 09/15/2021    Procedure: IR WITH ANESTHESIA;  Surgeon: Radiologist, Medication, MD;  Location: [facility];  Service: Radiology;  Laterality: N/A;    History reviewed. No pertinent family history. Social History:  reports that he quit smoking about 50 years ago. His smoking use included cigarettes. He has a 13.00 pack-year smoking history. He does not have any smokeless tobacco history on file. He reports that he does not drink alcohol. No history on file for drug use. Allergies:       Allergies  Allergen Reactions   Penicillins Hives, Swelling, Rash and Other (See Comments)      Peeling skin and caused the gums to peel/crack          Medications Prior to Admission  Medication Sig Dispense Refill   B Complex Vitamins (VITAMIN B COMPLEX) TABS Take 1 tablet by mouth daily with breakfast.  Cholecalciferol (VITAMIN D3) 25 MCG (1000 UT) CAPS Take 1,000 Units by mouth daily.       Coenzyme Q10 (COQ-10 PO) Take 1 capsule by mouth daily.       Cyanocobalamin (VITAMIN B-12) 1000 MCG SUBL Place 1,000 mcg under the tongue daily.       ELIQUIS 5 MG TABS tablet Take 5 mg by mouth 2 (two) times daily.       ferrous sulfate 325 (65 FE) MG tablet Take 325 mg by mouth daily with breakfast.       furosemide (LASIX) 40 MG tablet Take 40 mg by mouth in the morning.       levothyroxine (SYNTHROID) 75 MCG tablet Take 75 mcg by mouth See  admin instructions. Take 75 mcg by mouth before breakfast on Mon/Tues/Wed/Thurs/Fri              Home: Home Living Family/patient expects to be discharged to:: Private residence Living Arrangements: Spouse/significant other Available Help at Discharge: Family Type of Home: Mobile home Home Access: Stairs to enter, Ramped entrance Technical brewer of Steps: 5 steps at front and ramp at back [place]: One level Bathroom Shower/Tub: Chiropodist: [place]: Rollator (4 wheels), Cane - single point, Civil engineer, contracting, Wheelchair - power Additional Comments: DME belongs to pt's wife   Functional History: Prior Function Prior Level of Function : Independent/Modified Independent Mobility Comments: no use of AD ADLs Comments: Per wife, pt "hard worker"; Works in yard, Chief of Staff Status:  Mobility: Bed Mobility Overal bed mobility: Needs Assistance Bed Mobility: Sit to Supine Rolling: Mod assist Sidelying to sit: Mod assist, +2 for safety/equipment, HOB elevated Supine to sit: Min assist Sit to supine: Min guard General bed mobility comments: pt up in chair upon PT arrival Transfers Overall transfer level: Needs assistance Equipment used: Rolling walker (2 wheels) Transfers: Sit to/from Stand Sit to Stand: Min assist Bed to/from chair/wheelchair/BSC transfer type:: Via Lift equipment Step pivot transfers: Mod assist, +2 safety/equipment Transfer via Lift Equipment: Stedy General transfer comment: minA to power up, increased time, verbal cues for safe hand placement Ambulation/Gait Ambulation/Gait assistance: Mod assist Gait Distance (Feet): 120 Feet (x2) Assistive device: Rolling walker (2 wheels) Gait Pattern/deviations: Step-through pattern, Decreased stride length, Trunk flexed, Narrow base of support General Gait Details: pt much improved upright posture compared to yesterday, verbal cues to maintain with onset of fatigue. verbal  cues to increased step length and height, difficult to maintain but did attempt with all verbal cues. pt took 1 2 minute seated rest break between 2 amb bouts, SpO2 >88% on RA, denies SOB Gait velocity: slow Gait velocity interpretation: <1.31 ft/sec, indicative of household ambulator Pre-gait activities: Stood x 2 with Stedy x 30 seconds with verbal/tactile cues to extend hips/trunk   ADL: ADL Overall ADL's : Needs assistance/impaired Eating/Feeding: NPO Grooming: Minimal assistance, Standing, Oral care, Wash/dry face Grooming Details (indicate cue type and reason): leaning forward on sink significantly to complete denture care. noted fatigue though pt declined need to sit down. assist to open packaging and place toothpaste on toothbrush due to poor bimanual manipulation/balance deficits. able to brush hair thoroughly while seated in recliner at end of session Upper Body Bathing: Moderate assistance, Sitting Lower Body Bathing: Maximal assistance, Sit to/from stand Upper Body Dressing : Minimal assistance, Sitting Lower Body Dressing: Maximal assistance, Sit to/from stand Toilet Transfer: Moderate assistance, +2 for safety/equipment, Stand-pivot, BSC/3in1 Toileting- Clothing Manipulation and Hygiene: Maximal assistance, Sitting/lateral lean, Sit to/from  stand Functional mobility during ADLs: Minimal assistance, Rolling walker (2 wheels), Cueing for sequencing, Cueing for safety General ADL Comments: Emphasis on standing tolerance and coordination with ADLs at sink, as well as mobility in room with RW.   Cognition: Cognition Overall Cognitive Status: Impaired/Different from baseline Arousal/Alertness: Lethargic Orientation Level: Oriented X4 Attention: Selective Selective Attention: Impaired Selective Attention Impairment: Verbal basic Awareness: Impaired Cognition Arousal/Alertness: Awake/alert Behavior During Therapy: Impulsive Overall Cognitive Status: Impaired/Different from  baseline Area of Impairment: Following commands, Safety/judgement, Awareness, Problem solving Following Commands: Follows one step commands consistently, Follows multi-step commands with increased time Safety/Judgement: Decreased awareness of safety, Decreased awareness of deficits Awareness: Emergent Problem Solving: Slow processing, Requires verbal cues, Requires tactile cues General Comments: pt continues to initiation movement with some impulsivity however suspect this to pt's independent mentality, pt appears more alert and interactive today with more energy Difficult to assess due to: Impaired communication (Voice very low volume)   Physical Exam: Blood pressure 122/70, pulse (!) 44, temperature 98.2 F (36.8 C), resp. rate 18, height 6\' 1"  (1.854 m), weight 75.2 kg, SpO2 93 %. Physical Exam   General: Alert and oriented x 4, No apparent distress HEENT: Head is normocephalic, atraumatic, PERRLA, EOMI, sclera anicteric, oral mucosa pink and moist, dentition intact  Neck: Supple without JVD or lymphadenopathy Heart: IRR. No murmurs rubs or gallops Chest: CTA bilaterally without wheezes, rales, or rhonchi; no distress Abdomen: Soft, non-tender, non-distended, bowel sounds positive. Extremities: No clubbing, cyanosis, or edema. Pulses are 2+ Psych: Pt's affect is appropriate. Pt is cooperative Skin: Clean and intact without signs of breakdown GU: external cath draining yellow urine Neuro: Alert,  follows commands, mild impulsivity and decreased safety awareness noted, able to name objects, able to repeat correctly, sensation to light touch intact in all 4 extremities, cranial nerves II through XII intact other than right mild facial droop, DTR 2+ and symmetric at knee, ankle, BR, Tri, Bic, mild dysarthria, finger-to-nose a little slower on the right, minimal drift bilaterally Strength 5 out of 5 in left upper extremity and lower extremity Strength 4+ out of 5 proximal right upper  extremity 4+ -5 out of 5 distal right upper extremity Strength 4+ out of 5 proximal right lower extremity, 5 out of 5 distal right lower extremity   Musculoskeletal: No increased tone, no range of motion deficits noted, no joint swelling or tenderness noted   Lab Results Last 48 Hours        Results for orders placed or performed during the hospital encounter of 09/15/21 (from the past 48 hour(s))  Glucose, capillary     Status: None    Collection Time: 09/20/21  5:44 PM  Result Value Ref Range    Glucose-Capillary 85 70 - 99 mg/dL      Comment: Glucose reference range applies only to samples taken after fasting for at least 8 hours.  Glucose, capillary     Status: Abnormal    Collection Time: 09/20/21 10:20 PM  Result Value Ref Range    Glucose-Capillary 122 (H) 70 - 99 mg/dL      Comment: Glucose reference range applies only to samples taken after fasting for at least 8 hours.  CBC     Status: Abnormal    Collection Time: 09/21/21  1:22 AM  Result Value Ref Range    WBC 5.5 4.0 - 10.5 K/uL    RBC 3.74 (L) 4.22 - 5.81 MIL/uL    Hemoglobin 12.1 (L) 13.0 - 17.0 g/dL  HCT 36.6 (L) 39.0 - 52.0 %    MCV 97.9 80.0 - 100.0 fL    MCH 32.4 26.0 - 34.0 pg    MCHC 33.1 30.0 - 36.0 g/dL    RDW 29.5 28.4 - 13.2 %    Platelets 64 (L) 150 - 400 K/uL      Comment: Immature Platelet Fraction may be clinically indicated, consider ordering this additional test [id number]      nRBC 0.0 0.0 - 0.2 %      Comment: Performed at [facility], [address], [place], [place] [address]  Basic metabolic panel     Status: Abnormal    Collection Time: 09/21/21  1:22 AM  Result Value Ref Range    Sodium 140 135 - 145 mmol/L      Comment: DELTA CHECK NOTED    Potassium 3.8 3.5 - 5.1 mmol/L    Chloride 105 98 - 111 mmol/L    CO2 27 22 - 32 mmol/L    Glucose, Bld 101 (H) 70 - 99 mg/dL      Comment: Glucose reference range applies only to samples taken after fasting for at least 8 hours.    BUN  21 8 - 23 mg/dL    Creatinine, Ser 4.03 0.61 - 1.24 mg/dL    Calcium 8.5 (L) 8.9 - 10.3 mg/dL    GFR, Estimated >47 >42 mL/min      Comment: (NOTE) Calculated using the CKD-EPI Creatinine Equation (2021)      Anion gap 8 5 - 15      Comment: Performed at [facility], [address], [place], [place] [address]  Glucose, capillary     Status: Abnormal    Collection Time: 09/21/21  6:14 AM  Result Value Ref Range    Glucose-Capillary 114 (H) 70 - 99 mg/dL      Comment: Glucose reference range applies only to samples taken after fasting for at least 8 hours.  Glucose, capillary     Status: Abnormal    Collection Time: 09/21/21 11:55 AM  Result Value Ref Range    Glucose-Capillary 143 (H) 70 - 99 mg/dL      Comment: Glucose reference range applies only to samples taken after fasting for at least 8 hours.  Glucose, capillary     Status: Abnormal    Collection Time: 09/21/21  4:39 PM  Result Value Ref Range    Glucose-Capillary 136 (H) 70 - 99 mg/dL      Comment: Glucose reference range applies only to samples taken after fasting for at least 8 hours.    Comment 1 Notify RN      Comment 2 Document in Chart    Glucose, capillary     Status: Abnormal    Collection Time: 09/21/21  9:24 PM  Result Value Ref Range    Glucose-Capillary 118 (H) 70 - 99 mg/dL      Comment: Glucose reference range applies only to samples taken after fasting for at least 8 hours.    Comment 1 Notify RN      Comment 2 Document in Chart    Glucose, capillary     Status: Abnormal    Collection Time: 09/22/21  6:16 AM  Result Value Ref Range    Glucose-Capillary 195 (H) 70 - 99 mg/dL      Comment: Glucose reference range applies only to samples taken after fasting for at least 8 hours.    Comment 1 Notify RN  Comment 2 Document in Chart    Glucose, capillary     Status: Abnormal    Collection Time: 09/22/21 11:40 AM  Result Value Ref Range    Glucose-Capillary 162 (H) 70 - 99 mg/dL      Comment:  Glucose reference range applies only to samples taken after fasting for at least 8 hours.      Imaging Results (Last 48 hours)  No results found.         Blood pressure 122/70, pulse (!) 44, temperature 98.2 F (36.8 C), resp. rate 18, height 6\' 1"  (1.854 m), weight 75.2 kg, SpO2 93 %.   Medical Problem List and Plan: 1. 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             -patient may shower             -ELOS/Goals: 10-12, Mod I to intermittent assist with PT, OT and SLP 2.  Antithrombotics: -DVT/anticoagulation:  TED hose -on Eliquis             -antiplatelet therapy: none 3. Pain Management: Tylenol prn 4. Mood/Sleep: LCSW to evaluate and provide emotional support             -antipsychotic agents: n/a 5. Neuropsych/cognition: This patient is capable of making decisions on his own behalf. 6. Skin/Wound Care: Routine skin care checks 7. Fluids/Electrolytes/Nutrition: Routine I's and O's and follow-up chemistries             --dysphagia 3 diet/thins 8: Respiratory failure/aspiration pneumonia: extubated 6/17 9: Chronic atrial fibrillation: continue Eliquis, Coreg             -- follows with Dr. Jac Canavan at [facility] 10: Cardiomyopathy/HFrEF: continue Coreg             -daily weight 11: Ileus: Resolved, tolerating dysphagia 3 diet 12: Diabetes mellitus: continue SSI q ACHS; CBG monitoring 13: Acute kidney injury atop chronic kidney disease stage III: Follow-up BMP --BUN and creatinine normal 6/19 14: Bioprosthetic aortic valve 15: Hyperlipidemia: continue Lipitor 40 mg daily 16: Hypothyroidism: continue Synthroid 75 mcg daily 17: History of chronic DVT/PE: on Eliquis 18: Tobacco use: cessation counseling 19: Paroxysmal atrial fibrillation: on carvedilol and Eliquis   I have personally performed a face to face diagnostic  evaluation of this patient and formulated the key components of the plan.  Additionally, I have personally reviewed laboratory data, imaging studies, as well as relevant notes and concur with the physician assistant's documentation above.  The patient's status has not changed from the original H&P.  Any changes in documentation from the acute care chart have been noted above.  Jennye Boroughs, MD, FAAPMR    Barbie Banner, PA-C 09/22/2021

## 2021-09-22 NOTE — H&P (Signed)
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  His wife alerted EMS.  He is maintained on Eliquis secondary to history of PE, chronic DVT and chronic A-fib.  He was therefore not a candidate for TNK.  CT head and CT angio head performed with evidence of hyperdense left M1 MCA segment highly suspicious for the presence of endoluminal thrombus.  Dr. Thomasena Edis, neurology was consulted and interventional neuroradiology consulted regarding mechanical thrombectomy.  He underwent diagnostic cerebral angiogram on 6/14 with mechanical thrombectomy with complete recanalization of the MCA.  He was intubated for the procedure and vomited during pressure support and developed aspiration pneumonitis.  Developed acute respiratory failure with hypoxemia.  Required Levophed and heparin IV.  2D echo performed revealed ejection fraction estimated 20 to 25%.  His hemoglobin A1c is 5.9.  Given heparin subcutaneously for VTE prophylaxis.  Initially given aspirin 325 mg daily, then transitioned from IV heparin to Eliquis.  Remained on Rocephin for aspiration pneumonia.  He developed an ileus and was kept on bowel rest with orogastric tube to suction.  MRI of the brain performed 6/15 revealed patchy acute infarct in the left basal ganglia and left medial temporal lobe, recanalized left MCA M1 segment and patent left MCA bifurcation.  He improved over the next several days and was off vasopressors and following commands. He was subsequently extubated on 6/17.  He was transferred out of the ICU on 6/19. He reports his right arm and leg continue to be a little weaker than the left.  He is on DYS 3/thin diet. The patient requires inpatient medicine and rehabilitation evaluations  and services for ongoing dysfunction secondary to left MCA infarcts.   Denies alcohol and tobacco use. Teaches Sunday school. Lives with wife, [name].     Review of Systems  Constitutional:  Negative for chills and fever.  HENT:  Negative for congestion.        Poor fitting dentures prevent him from chewing well  Eyes:  Negative for blurred vision.  Respiratory:  Positive for cough.        S/o cough with oral intake  Cardiovascular:  Negative for chest pain and palpitations.  Gastrointestinal:  Positive for constipation. Negative for abdominal pain, nausea and vomiting.  Genitourinary:  Positive for urgency.       External urinary  catheter  Skin:  Negative for itching and rash.  Neurological:  Positive for dizziness. Negative for headaches.  Psychiatric/Behavioral:  Negative for depression.         Past Medical History:  Diagnosis Date   Aortic stenosis     Chronic renal impairment, stage 3 (moderate), unspecified whether stage 3a or 3b CKD ([name])     Diabetes ([name])     HFrEF (heart failure with reduced ejection fraction) ([name])     Hypothyroidism     PE (pulmonary thromboembolism) ([name]) 2022         Past Surgical History:  Procedure Laterality Date   AORTIC VALVE REPLACEMENT       CHOLECYSTECTOMY       IR CT HEAD LTD   09/15/2021   IR PERCUTANEOUS ART THROMBECTOMY/INFUSION INTRACRANIAL INC DIAG ANGIO   09/15/2021   IR US GUIDE VASC ACCESS RIGHT  09/15/2021   RADIOLOGY WITH ANESTHESIA N/A 09/15/2021    Procedure: IR WITH ANESTHESIA;  Surgeon: Radiologist, Medication, MD;  Location: [facility] OR;  Service: Radiology;  Laterality: N/A;    History reviewed. No pertinent family history. Social History:  reports that he quit smoking about 50 years ago. His smoking use included cigarettes. He has a 13.00 pack-year smoking history. He does not have any smokeless tobacco history on file. He reports that he does not drink alcohol. No history on file for drug use. Allergies:       Allergies   Allergen Reactions   Penicillins Hives, Swelling, Rash and Other (See Comments)      Peeling skin and caused the gums to peel/crack          Medications Prior to Admission  Medication Sig Dispense Refill   B Complex Vitamins (VITAMIN B COMPLEX) TABS Take 1 tablet by mouth daily with breakfast.       Cholecalciferol (VITAMIN D3) 25 MCG (1000 UT) CAPS Take 1,000 Units by mouth daily.       Coenzyme Q10 (COQ-10 PO) Take 1 capsule by mouth daily.       Cyanocobalamin (VITAMIN B-12) 1000 MCG SUBL Place 1,000 mcg under the tongue daily.       ELIQUIS 5 MG TABS tablet Take 5 mg by mouth 2 (two) times daily.       ferrous sulfate 325 (65 FE) MG tablet Take 325 mg by mouth daily with breakfast.       furosemide (LASIX) 40 MG tablet Take 40 mg by mouth in the morning.       levothyroxine (SYNTHROID) 75 MCG tablet Take 75 mcg by mouth See admin instructions. Take 75 mcg by mouth before breakfast on Mon/Tues/Wed/Thurs/Fri              Home: Home Living Family/patient expects to be discharged to:: Private residence Living Arrangements: Spouse/significant other Available Help at Discharge: Family Type of Home: Mobile home Home Access: Stairs to enter, Ramped entrance Secretary/administrator of Steps: 5 steps at front and ramp at back Home Layout: One level Bathroom Shower/Tub: Engineer, manufacturing systems: Standard Home Equipment: Rollator (4 wheels), Cane - single point, Information systems manager, Wheelchair - power Additional Comments: DME belongs to pt's wife   Functional History: Prior Function Prior Level of Function : Independent/Modified Independent Mobility Comments: no use of AD ADLs Comments: Per wife, pt "hard worker"; Works in yard, Manufacturing systems engineer Status:  Mobility: Bed Mobility Overal bed mobility: Needs Assistance Bed Mobility: Sit to Supine Rolling: Mod assist Sidelying to sit: Mod assist, +2 for safety/equipment, HOB elevated Supine to sit: Min assist Sit to supine: Min  guard General bed mobility comments: pt up in chair upon PT arrival Transfers Overall transfer level: Needs assistance Equipment used: Rolling walker (2 wheels) Transfers: Sit to/from Stand Sit to Stand: Min assist Bed to/from chair/wheelchair/BSC transfer type:: Via Lift equipment Step pivot transfers: Mod assist, +2 safety/equipment Transfer via Lift Equipment: Stedy General transfer comment: minA to power up, increased time, verbal cues for safe hand placement Ambulation/Gait Ambulation/Gait assistance: Mod assist Gait Distance (Feet): 120 Feet (x2) Assistive device: Rolling walker (2 wheels) Gait Pattern/deviations: Step-through pattern, Decreased stride length, Trunk flexed, Narrow base of support General Gait Details: pt much improved upright posture compared to yesterday, verbal cues to maintain with onset of fatigue. verbal cues to increased step length and height, difficult to maintain but did attempt with all verbal cues. pt took 1 2  minute seated rest break between 2 amb bouts, SpO2 >88% on RA, denies SOB Gait velocity: slow Gait velocity interpretation: <1.31 ft/sec, indicative of household ambulator Pre-gait activities: Stood x 2 with Stedy x 30 seconds with verbal/tactile cues to extend hips/trunk   ADL: ADL Overall ADL's : Needs assistance/impaired Eating/Feeding: NPO Grooming: Minimal assistance, Standing, Oral care, Wash/dry face Grooming Details (indicate cue type and reason): leaning forward on sink significantly to complete denture care. noted fatigue though pt declined need to sit down. assist to open packaging and place toothpaste on toothbrush due to poor bimanual manipulation/balance deficits. able to brush hair thoroughly while seated in recliner at end of session Upper Body Bathing: Moderate assistance, Sitting Lower Body Bathing: Maximal assistance, Sit to/from stand Upper Body Dressing : Minimal assistance, Sitting Lower Body Dressing: Maximal assistance, Sit  to/from stand Toilet Transfer: Moderate assistance, +2 for safety/equipment, Stand-pivot, BSC/3in1 Toileting- Clothing Manipulation and Hygiene: Maximal assistance, Sitting/lateral lean, Sit to/from stand Functional mobility during ADLs: Minimal assistance, Rolling walker (2 wheels), Cueing for sequencing, Cueing for safety General ADL Comments: Emphasis on standing tolerance and coordination with ADLs at sink, as well as mobility in room with RW.   Cognition: Cognition Overall Cognitive Status: Impaired/Different from baseline Arousal/Alertness: Lethargic Orientation Level: Oriented X4 Attention: Selective Selective Attention: Impaired Selective Attention Impairment: Verbal basic Awareness: Impaired Cognition Arousal/Alertness: Awake/alert Behavior During Therapy: Impulsive Overall Cognitive Status: Impaired/Different from baseline Area of Impairment: Following commands, Safety/judgement, Awareness, Problem solving Following Commands: Follows one step commands consistently, Follows multi-step commands with increased time Safety/Judgement: Decreased awareness of safety, Decreased awareness of deficits Awareness: Emergent Problem Solving: Slow processing, Requires verbal cues, Requires tactile cues General Comments: pt continues to initiation movement with some impulsivity however suspect this to pt's independent mentality, pt appears more alert and interactive today with more energy Difficult to assess due to: Impaired communication (Voice very low volume)   Physical Exam: Blood pressure 122/70, pulse (!) 44, temperature 98.2 F (36.8 C), resp. rate 18, height 6\' 1"  (1.854 m), weight 75.2 kg, SpO2 93 %. Physical Exam     General: Alert and oriented x 4, No apparent distress HEENT: Head is normocephalic, atraumatic, PERRLA, EOMI, sclera anicteric, oral mucosa pink and moist, dentition intact  Neck: Supple without JVD or lymphadenopathy Heart: IRR. No murmurs rubs or gallops Chest:  CTA bilaterally without wheezes, rales, or rhonchi; no distress Abdomen: Soft, non-tender, non-distended, bowel sounds positive. Extremities: No clubbing, cyanosis, or edema. Pulses are 2+ Psych: Pt's affect is appropriate. Pt is cooperative Skin: Clean and intact without signs of breakdown GU: external cath draining yellow urine Neuro: Alert,  follows commands, mild impulsivity and decreased safety awareness noted, able to name objects, able to repeat correctly, sensation to light touch intact in all 4 extremities, cranial nerves II through XII intact other than right mild facial droop, DTR 2+ and symmetric at knee, ankle, BR, Tri, Bic, mild dysarthria, finger-to-nose a little slower on the right, minimal drift bilaterally Strength 5 out of 5 in left upper extremity and lower extremity Strength 4+ out of 5 proximal right upper extremity 4+ -5 out of 5 distal right upper extremity Strength 4+ out of 5 proximal right lower extremity, 5 out of 5 distal right lower extremity     Musculoskeletal: No increased tone, no range of motion deficits noted, no joint swelling or tenderness noted   Lab Results Last 48 Hours        Results for orders placed or performed during the hospital  encounter of 09/15/21 (from the past 48 hour(s))  Glucose, capillary     Status: None    Collection Time: 09/20/21  5:44 PM  Result Value Ref Range    Glucose-Capillary 85 70 - 99 mg/dL      Comment: Glucose reference range applies only to samples taken after fasting for at least 8 hours.  Glucose, capillary     Status: Abnormal    Collection Time: 09/20/21 10:20 PM  Result Value Ref Range    Glucose-Capillary 122 (H) 70 - 99 mg/dL      Comment: Glucose reference range applies only to samples taken after fasting for at least 8 hours.  CBC     Status: Abnormal    Collection Time: 09/21/21  1:22 AM  Result Value Ref Range    WBC 5.5 4.0 - 10.5 K/uL    RBC 3.74 (L) 4.22 - 5.81 MIL/uL    Hemoglobin 12.1 (L) 13.0 -  17.0 g/dL    HCT 36.6 (L) 39.0 - 52.0 %    MCV 97.9 80.0 - 100.0 fL    MCH 32.4 26.0 - 34.0 pg    MCHC 33.1 30.0 - 36.0 g/dL    RDW 13.5 11.5 - 15.5 %    Platelets 64 (L) 150 - 400 K/uL      Comment: Immature Platelet Fraction may be clinically indicated, consider ordering this additional test [id number]      nRBC 0.0 0.0 - 0.2 %      Comment: Performed at [facility], [place] [address], [place], [place] [id number]  Basic metabolic panel     Status: Abnormal    Collection Time: 09/21/21  1:22 AM  Result Value Ref Range    Sodium 140 135 - 145 mmol/L      Comment: DELTA CHECK NOTED    Potassium 3.8 3.5 - 5.1 mmol/L    Chloride 105 98 - 111 mmol/L    CO2 27 22 - 32 mmol/L    Glucose, Bld 101 (H) 70 - 99 mg/dL      Comment: Glucose reference range applies only to samples taken after fasting for at least 8 hours.    BUN 21 8 - 23 mg/dL    Creatinine, Ser 0.98 0.61 - 1.24 mg/dL    Calcium 8.5 (L) 8.9 - 10.3 mg/dL    GFR, Estimated >60 >60 mL/min      Comment: (NOTE) Calculated using the CKD-EPI Creatinine Equation (2021)      Anion gap 8 5 - 15      Comment: Performed at [facility] [address], [place], [place] [address]  Glucose, capillary     Status: Abnormal    Collection Time: 09/21/21  6:14 AM  Result Value Ref Range    Glucose-Capillary 114 (H) 70 - 99 mg/dL      Comment: Glucose reference range applies only to samples taken after fasting for at least 8 hours.  Glucose, capillary     Status: Abnormal    Collection Time: 09/21/21 11:55 AM  Result Value Ref Range    Glucose-Capillary 143 (H) 70 - 99 mg/dL      Comment: Glucose reference range applies only to samples taken after fasting for at least 8 hours.  Glucose, capillary     Status: Abnormal    Collection Time: 09/21/21  4:39 PM  Result Value Ref Range    Glucose-Capillary 136 (H) 70 - 99 mg/dL      Comment: Glucose  reference range applies only to samples taken after fasting for at least 8 hours.     Comment 1 Notify RN      Comment 2 Document in Chart    Glucose, capillary     Status: Abnormal    Collection Time: 09/21/21  9:24 PM  Result Value Ref Range    Glucose-Capillary 118 (H) 70 - 99 mg/dL      Comment: Glucose reference range applies only to samples taken after fasting for at least 8 hours.    Comment 1 Notify RN      Comment 2 Document in Chart    Glucose, capillary     Status: Abnormal    Collection Time: 09/22/21  6:16 AM  Result Value Ref Range    Glucose-Capillary 195 (H) 70 - 99 mg/dL      Comment: Glucose reference range applies only to samples taken after fasting for at least 8 hours.    Comment 1 Notify RN      Comment 2 Document in Chart    Glucose, capillary     Status: Abnormal    Collection Time: 09/22/21 11:40 AM  Result Value Ref Range    Glucose-Capillary 162 (H) 70 - 99 mg/dL      Comment: Glucose reference range applies only to samples taken after fasting for at least 8 hours.      Imaging Results (Last 48 hours)  No results found.         Blood pressure 122/70, pulse (!) 44, temperature 98.2 F (36.8 C), resp. rate 18, height 6\' 1"  (1.854 m), weight 75.2 kg, SpO2 93 %.   Medical Problem List and Plan: 1. 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             -patient may shower             -ELOS/Goals: 10-12 days,  Mod I to intermittent assist with PT, OT and SLP             -Admit to CIR 2.  Antithrombotics: -DVT/anticoagulation:  TED hose -on Eliquis             -antiplatelet therapy: none 3. Pain Management: Tylenol prn 4. Mood/Sleep: LCSW to evaluate and provide emotional support             -antipsychotic agents: n/a 5. Neuropsych/cognition: This patient is capable of making decisions on his own behalf. 6. Skin/Wound Care: Routine skin care checks 7. Fluids/Electrolytes/Nutrition: Routine  I's and O's and follow-up chemistries             --dysphagia 3 diet/thins 8: Respiratory failure/aspiration pneumonia: extubated 6/17             -Denies shortness of breath, on room air 9: Chronic atrial fibrillation: continue Eliquis, Coreg             -- follows with Dr. 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 Dysphagia Dys 3/thin diet             -Continue SLP       I have personally performed a face to face diagnostic evaluation of this patient and formulated the key components of the plan.  Additionally, I have personally reviewed laboratory data, imaging studies, as well as relevant notes and concur with the physician assistant's documentation above.   The patient's status has not changed from the original H&P.  Any changes in documentation from the acute care chart have been noted above.   Jennye Boroughs, MD, FAAPMR     Barbie Banner, PA-C 09/22/2021          Revision History

## 2021-09-22 NOTE — PMR Pre-admission (Signed)
PMR Admission Coordinator Pre-Admission Assessment  Patient: Eugene Campbell is an 81 y.o., male MRN: [id number] DOB: [birth year] Height: 6' 1" (185.4 cm) Weight: 75.2 kg  Insurance Information HMO: yes    PPO:      PCP:      IPA:      80/20:      OTHER:  PRIMARY: [employer]      Policy#: [id number]      Subscriber: pt CM Name: Andee Poles      Phone#: [phone] ext [phone]     Fax#: [phone] Pre-Cert#: [id number] approved for 7 days  F/u with Hilbert Odor phone [phone] ext [phone] fax [phone]      Employer:  Benefits:  Phone #: [phone]     Name: 6/20 Eff. Date: 04/04/21     Deduct: none      Out of Pocket Max: $3400      CIR: $295 co pay per day days 1 until 6      SNF: no copay days 1 until 20; $196 co pay per day days 21 until 100 Outpatient: $10 to $20 per visit     Co-Pay: visits per medical neccesity Home Health: 100%      Co-Pay: visit sepr medical neccesity DME: 80%     Co-Pay: 20% Providers: in network  SECONDARY: none        Financial Counselor:       Phone#:   The Engineer, petroleum" for patients in Inpatient Rehabilitation Facilities with attached "Privacy Act [place] Records" was provided and verbally reviewed with: Patient and Family  Emergency Contact Information Contact Information     Name Relation Home Work Mobile   [place] Spouse   [phone]   [facility] Daughter   [phone]      Current Medical History  Patient Admitting Diagnosis: CVA  History of Present Illness: 81 year old male  with medical history of aortic stenosis with AVR, Chronic renal disease stage 3, DM, heart failure,  Afib on Eliquis,hypothyroidism and PE. Presented on 09/15/21 when he was previously outside working and came inside and wife noted not speaking or responding as normal. EMS called. Some question concerning noncompliance with meds pta.   Neurology consulted. 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 He underwent mechanical  thrombectomy with complete recanalization. Postoperative with acute respiratory failure with hypoxemia due to aspiration pneumonitis after massive aspiration on 6/15.. Treated with Rocephin and required vent support with PCCM involved for support. 2 D echo with EF 20 to 25 %. Heparin SQ for VTE prophylaxis. Plans to transition to Eliquis. Chronic a fib rate controlled on Eliquis at home. Also on amiodarone and metoprolol and follow up with Dr Jac Canavan at [facility]. Not on significant heart failure regimen given hypotension and bradycardia. Treated for ileus and felt the cause of large vomitus with aspiration. Hb A1c 5.9. CBG monitoring. Declines use of insulin due to family history of reaction. Extubated on 09/18/21.   Complete NIHSS TOTAL: 0  Patient's medical record from [facility]  has been reviewed by the rehabilitation admission coordinator and physician.  Past Medical History  Past Medical History:  Diagnosis Date   Aortic stenosis    Chronic renal impairment, stage 3 (moderate), unspecified whether stage 3a or 3b CKD ([name])    Diabetes ([place])    HFrEF (heart failure with reduced ejection fraction) ([place])    Hypothyroidism    PE (pulmonary thromboembolism) ([place]) 2022   Has the patient had major surgery during 100  days prior to admission? No  Family History   family history is not on file.  Current Medications  Current Facility-Administered Medications:    0.9 %  sodium chloride infusion, 250 mL, Intravenous, Continuous, Corey Harold, NP, Stopped at 09/16/21 1548   acetaminophen (TYLENOL) tablet 650 mg, 650 mg, Oral, Q4H PRN **OR** acetaminophen (TYLENOL) 160 MG/5ML solution 650 mg, 650 mg, Per Tube, Q4H PRN **OR** acetaminophen (TYLENOL) suppository 650 mg, 650 mg, Rectal, Q4H PRN, Theda Sers, Unk Pinto, MD   apixaban (ELIQUIS) tablet 5 mg, 5 mg, Oral, BID, Leonie Man, Pramod S, MD, 5 mg at 09/22/21 1308   atorvastatin (LIPITOR) tablet 40 mg, 40 mg, Oral, Daily, McQuaid, Douglas B,  MD, 40 mg at 09/22/21 6578   bisacodyl (DULCOLAX) suppository 10 mg, 10 mg, Rectal, Daily PRN, Simonne Maffucci B, MD   carvedilol (COREG) tablet 3.125 mg, 3.125 mg, Oral, BID WC, Antony Contras S, MD, 3.125 mg at 09/22/21 4696   Chlorhexidine Gluconate Cloth 2 % PADS 6 each, 6 each, Topical, Daily, Gwinda Maine, MD, 6 each at 09/22/21 0807   feeding supplement (ENSURE ENLIVE / ENSURE PLUS) liquid 237 mL, 237 mL, Oral, TID BM, McQuaid, Douglas B, MD, 237 mL at 09/22/21 1342   hydrALAZINE (APRESOLINE) injection 10-20 mg, 10-20 mg, Intravenous, Q4H PRN, Nallamothu, Ravindra N, MD, 20 mg at 09/19/21 0749   insulin aspart (novoLOG) injection 0-15 Units, 0-15 Units, Subcutaneous, TID WC, McQuaid, Douglas B, MD, 2 Units at 09/21/21 1212   insulin aspart (novoLOG) injection 0-5 Units, 0-5 Units, Subcutaneous, QHS, McQuaid, Douglas B, MD   levothyroxine (SYNTHROID) tablet 75 mcg, 75 mcg, Oral, Q0600, Simonne Maffucci B, MD, 75 mcg at 09/22/21 2952   multivitamin with minerals tablet 1 tablet, 1 tablet, Oral, Daily, Simonne Maffucci B, MD, 1 tablet at 09/22/21 0806   Oral care mouth rinse, 15 mL, Mouth Rinse, PRN, Nallamothu, Ravindra N, MD, 15 mL at 09/20/21 0511   polyethylene glycol (MIRALAX / GLYCOLAX) packet 17 g, 17 g, Oral, Daily, McQuaid, Douglas B, MD, 17 g at 09/22/21 8413   senna (SENOKOT) tablet 17.2 mg, 2 tablet, Oral, BID, McQuaid, Douglas B, MD, 17.2 mg at 09/22/21 0806   sodium chloride flush (NS) 0.9 % injection 10-40 mL, 10-40 mL, Intracatheter, Q12H, Elgergawy, Silver Huguenin, MD, 10 mL at 09/22/21 0806   sodium chloride flush (NS) 0.9 % injection 10-40 mL, 10-40 mL, Intracatheter, PRN, Elgergawy, Silver Huguenin, MD, 30 mL at 09/20/21 1943  Patients Current Diet:  Diet Order             DIET DYS 3 Room service appropriate? Yes with Assist; Fluid consistency: Thin  Diet effective now                  Precautions / Restrictions Precautions Precautions: Fall Precaution Comments: monitor  O2 Restrictions Weight Bearing Restrictions: No   Has the patient had 2 or more falls or a fall with injury in the past year? No  Prior Activity Level Community (5-7x/wk): Independent , active and driving  Prior Functional Level Self Care: Did the patient need help bathing, dressing, using the toilet or eating? Independent  Indoor Mobility: Did the patient need assistance with walking from room to room (with or without device)? Independent  Stairs: Did the patient need assistance with internal or external stairs (with or without device)? Independent  Functional Cognition: Did the patient need help planning regular tasks such as shopping or remembering to take medications? Independent  Patient Information  Are you of Hispanic, Latino/a,or Spanish origin?: A. No, not of Hispanic, Latino/a, or Spanish origin What is your race?: A. White Do you need or want an interpreter to communicate with a doctor or health care staff?: 0. No  Patient's Response To:  Health Literacy and Transportation Is the patient able to respond to health literacy and transportation needs?: Yes Health Literacy - How often do you need to have someone help you when you read instructions, pamphlets, or other written material from your doctor or pharmacy?: Never In the past 12 months, has lack of transportation kept you from medical appointments or from getting medications?: No In the past 12 months, has lack of transportation kept you from meetings, work, or from getting things needed for daily living?: No  Development worker, international aid / Amanda: Radiation protection practitioner (4 wheels), Cane - single point, Civil engineer, contracting, Wheelchair - power  Prior Device Use: Indicate devices/aids used by the patient prior to current illness, exacerbation or injury? None of the above  Current Functional Level Cognition  Arousal/Alertness: Lethargic Overall Cognitive Status: Impaired/Different from baseline Difficult to assess due to:  Impaired communication (Voice very low volume) Orientation Level: Oriented X4 Following Commands: Follows one step commands consistently, Follows multi-step commands with increased time Safety/Judgement: Decreased awareness of safety, Decreased awareness of deficits General Comments: pt continues to initiation movement with some impulsivity however suspect this to pt's independent mentality, pt appears more alert and interactive today with more energy Attention: Selective Selective Attention: Impaired Selective Attention Impairment: Verbal basic Awareness: Impaired    Extremity Assessment (includes Sensation/Coordination)  Upper Extremity Assessment: RUE deficits/detail RUE Deficits / Details: 4-/5 shoulder mildly weaker than L side. 4+/5 biceps/triceps/grip strength RUE Coordination: decreased fine motor  Lower Extremity Assessment: Defer to PT evaluation RLE Deficits / Details: Excellently improved, can move weakly in isolated movments grossly 4/5 LLE Deficits / Details: generally functional and strong at 4+/5    ADLs  Overall ADL's : Needs assistance/impaired Eating/Feeding: NPO Grooming: Minimal assistance, Standing, Oral care, Wash/dry face Grooming Details (indicate cue type and reason): leaning forward on sink significantly to complete denture care. noted fatigue though pt declined need to sit down. assist to open packaging and place toothpaste on toothbrush due to poor bimanual manipulation/balance deficits. able to brush hair thoroughly while seated in recliner at end of session Upper Body Bathing: Moderate assistance, Sitting Lower Body Bathing: Maximal assistance, Sit to/from stand Upper Body Dressing : Minimal assistance, Sitting Lower Body Dressing: Maximal assistance, Sit to/from stand Toilet Transfer: Moderate assistance, +2 for safety/equipment, Stand-pivot, BSC/3in1 Toileting- Clothing Manipulation and Hygiene: Maximal assistance, Sitting/lateral lean, Sit to/from  stand Functional mobility during ADLs: Minimal assistance, Rolling walker (2 wheels), Cueing for sequencing, Cueing for safety General ADL Comments: Emphasis on standing tolerance and coordination with ADLs at sink, as well as mobility in room with RW.    Mobility  Overal bed mobility: Needs Assistance Bed Mobility: Sit to Supine Rolling: Mod assist Sidelying to sit: Mod assist, +2 for safety/equipment, HOB elevated Supine to sit: Min assist Sit to supine: Min guard General bed mobility comments: pt up in chair upon PT arrival    Transfers  Overall transfer level: Needs assistance Equipment used: Rolling walker (2 wheels) Transfers: Sit to/from Stand Sit to Stand: Min assist Bed to/from chair/wheelchair/BSC transfer type:: Via Lift equipment Step pivot transfers: Mod assist, +2 safety/equipment Transfer via Lift Equipment: Stedy General transfer comment: minA to power up, increased time, verbal cues for safe hand placement  Ambulation / Gait / Stairs / Wheelchair Mobility  Ambulation/Gait Ambulation/Gait assistance: Mod assist Gait Distance (Feet): 120 Feet (x2) Assistive device: Rolling walker (2 wheels) Gait Pattern/deviations: Step-through pattern, Decreased stride length, Trunk flexed, Narrow base of support General Gait Details: pt much improved upright posture compared to yesterday, verbal cues to maintain with onset of fatigue. verbal cues to increased step length and height, difficult to maintain but did attempt with all verbal cues. pt took 1 2 minute seated rest break between 2 amb bouts, SpO2 >88% on RA, denies SOB Gait velocity: slow Gait velocity interpretation: <1.31 ft/sec, indicative of household ambulator Pre-gait activities: Stood x 2 with Stedy x 30 seconds with verbal/tactile cues to extend hips/trunk    Posture / Balance Dynamic Sitting Balance Sitting balance - Comments: posterior LOB with dynamic tasks Balance Overall balance assessment: Needs  assistance Sitting-balance support: Feet supported, No upper extremity supported Sitting balance-Leahy Scale: Fair Sitting balance - Comments: posterior LOB with dynamic tasks Postural control: Posterior lean Standing balance support: During functional activity, Bilateral upper extremity supported Standing balance-Leahy Scale: Fair Standing balance comment: external support and UE's on AD    Special needs/care consideration Hgb A1c 5.9   Previous Home Environment  Living Arrangements: Spouse/significant other  Lives With: Spouse (spouse is 44 years old) Available Help at Discharge: Family, Available 24 hours/day (daughter Angie local and can help prn; Wife 58 years old and very HOH,stepdaughter can assist wife prn) Type of Home: Mobile home Home Layout: One level Home Access: Stairs to enter, Ramped entrance Entrance Stairs-Number of Steps: 5 steps at front and ramp at back Bathroom Shower/Tub: Chiropodist: Standard Bathroom Accessibility: Yes How Accessible: Accessible via walker Home Care Services: No Additional Comments: DME belongs to pt's wife  Discharge Living Setting Plans for Discharge Living Setting: Patient's home, Lives with (comment), Mobile Home (wife) Type of Home at Discharge: Mobile home Discharge Home Layout: One level Discharge Home Access: Stairs to enter, Ramped entrance (5 steps front, ramp in back) Entrance Stairs-Number of Steps: 5 steps in front and ramp in back Discharge Bathroom Shower/Tub: Tub/shower unit Discharge Bathroom Toilet: Standard Discharge Bathroom Accessibility: Yes How Accessible: Accessible via walker Does the patient have any problems obtaining your medications?: No  Social/Family/Support Systems Patient Roles: Spouse Contact Information: daughter, Angie Anticipated Caregiver: angie can provide prn assist Anticipated Caregiver's Contact Information: see contacts Ability/Limitations of Caregiver: spouse is 67 year  old and very HOH, Stepdtr assisting prn with her. Daughter, [name], can asisst prn Caregiver Availability: Intermittent Discharge Plan Discussed with Primary Caregiver: Yes Is Caregiver In Agreement with Plan?: Yes Does Caregiver/Family have Issues with Lodging/Transportation while Pt is in Rehab?: No  Goals Patient/Family Goal for Rehab: Mod I to intermittent assist with PT, OT and SLP Expected length of stay: ELOS 10 to 12 days Pt/Family Agrees to Admission and willing to participate: Yes Program Orientation Provided & Reviewed with Pt/Caregiver Including Roles  & Responsibilities: Yes  Decrease burden of Care through IP rehab admission: n/a  Possible need for SNF placement upon discharge: not anticipated  Patient Condition: I have reviewed medical records from [facility], spoken with CM, and patient, spouse, daughter, and family member. I met with patient at the bedside for inpatient rehabilitation assessment.  Patient will benefit from ongoing PT, OT, and SLP, can actively participate in 3 hours of therapy a day 5 days of the week, and can make measurable gains during the admission.  Patient will also benefit from the  coordinated team approach during an Inpatient Acute Rehabilitation admission.  The patient will receive intensive therapy as well as Rehabilitation physician, nursing, social worker, and care management interventions.  Due to bladder management, bowel management, safety, skin/wound care, disease management, medication administration, pain management, and patient education the patient requires 24 hour a day rehabilitation nursing.  The patient is currently mod assist overall with mobility and basic ADLs.  Discharge setting and therapy post discharge at home with home health is anticipated.  Patient has agreed to participate in the Acute Inpatient Rehabilitation Program and will admit today.  Preadmission Screen Completed By:  Cleatrice Burke, 09/22/2021 1:51  PM ______________________________________________________________________   Discussed status with Dr. Curlene Dolphin on 09/22/21 at 1413 and received approval for admission today.  Admission Coordinator:  Cleatrice Burke, RN, time 5427 Date 09/22/2021   Assessment/Plan: Diagnosis:L MCA CVA Does the need for close, 24 hr/day Medical supervision in concert with the patient's rehab needs make it unreasonable for this patient to be served in a less intensive setting? Yes Co-Morbidities requiring supervision/potential complications: aortic stenosis with AVR, Chronic renal disease stage 3, DM, heart failure,  Afib on Eliquis,hypothyroidism and PE Due to bladder management, bowel management, safety, skin/wound care, disease management, medication administration, pain management, and patient education, does the patient require 24 hr/day rehab nursing? Yes Does the patient require coordinated care of a physician, rehab nurse, PT, OT, and SLP to address physical and functional deficits in the context of the above medical diagnosis(es)? Yes Addressing deficits in the following areas: balance, endurance, locomotion, strength, transferring, bowel/bladder control, bathing, dressing, feeding, grooming, toileting, cognition, speech, language, swallowing, and psychosocial support Can the patient actively participate in an intensive therapy program of at least 3 hrs of therapy 5 days a week? Yes The potential for patient to make measurable gains while on inpatient rehab is excellent Anticipated functional outcomes upon discharge from inpatient rehab: modified independent, supervision, and min assist PT, modified independent, supervision, and min assist OT, modified independent, supervision, and min assist SLP Estimated rehab length of stay to reach the above functional goals is: 10-12 Anticipated discharge destination: Home 10. Overall Rehab/Functional Prognosis: excellent   MD Signature: Jennye Boroughs

## 2021-09-22 NOTE — Progress Notes (Signed)
Inpatient Rehabilitation Admissions Coordinator   Insurance has approved and CIR bed is available to admit today. Acute team and TOC made aware. I will make the arrangements to admit today.  Ottie Glazier, RN, MSN Rehab Admissions Coordinator [phone] 09/22/2021 1:42 PM

## 2021-09-22 NOTE — TOC Transition Note (Signed)
Transition of Care [facility]) - CM/SW Discharge Note   Patient Details  Name: Eugene Campbell MRN: [id number] Date of Birth: [birth year]  [facility] of [facility] CM/SW Contact:  Kermit Balo, RN Phone Number: 09/22/2021, 2:40 PM   Clinical Narrative:    Patient is discharging to CIR today. CM signing off.    Final next level of care: IP Rehab Facility Barriers to Discharge: No Barriers Identified   Patient Goals and CMS Choice     Choice offered to / list presented to : Patient  Discharge Placement                       Discharge Plan and Services                                     Social Determinants of Health (SDOH) Interventions     Readmission Risk Interventions     No data to display

## 2021-09-22 NOTE — Progress Notes (Signed)
Jennye Boroughs, MD  Physician Physical Medicine and Rehabilitation PMR Pre-admission    Signed Date of Service:  09/22/2021  1:51 PM  Related encounter: ED to Hosp-Admission (Current) from 09/15/2021 in [facility]   Signed      PMR Admission Coordinator Pre-Admission Assessment   Patient: Eugene Campbell is an 81 y.o., male MRN: [id number] DOB: [birth year] Height: _0  (185.4 cm) Weight: 75.2 kg   Insurance Information HMO: yes    PPO:      PCP:      IPA:      80/20:      OTHER:  PRIMARY: [employer]      Policy#: [id number]      Subscriber: pt CM Name: Andee Poles      Phone#: [phone] ext [phone]     Fax#: [phone] Pre-Cert#: [id number] approved for 7 days  F/u with Hilbert Odor phone [phone] ext [phone] fax [phone]      Employer:  Benefits:  Phone #: [phone]     Name: 6/20 Eff. Date: 04/04/21     Deduct: none      Out of Pocket Max: $3400      CIR: $295 co pay per day days 1 until 6      SNF: no copay days 1 until 20; $196 co pay per day days 21 until 100 Outpatient: $10 to $20 per visit     Co-Pay: visits per medical neccesity Home Health: 100%      Co-Pay: visit sepr medical neccesity DME: 80%     Co-Pay: 20% Providers: in network  SECONDARY: none         Financial Counselor:       Phone#:    The Engineer, petroleum" for patients in Inpatient Rehabilitation Facilities with attached "Privacy Act [place] Records" was provided and verbally reviewed with: Patient and Family   Emergency Contact Information Contact Information       Name Relation Home Work Mobile    [place] Spouse     [phone]    [facility]: [place] Rhonda W Daughter     [phone]         Current Medical History  Patient Admitting Diagnosis: CVA   History of Present Illness: 81 year old male  with medical history of aortic stenosis with AVR, Chronic renal disease stage 3, DM, heart failure,  Afib on Eliquis,hypothyroidism and PE. Presented  on 09/15/21 when he was previously outside working and came inside and wife noted not speaking or responding as normal. EMS called. Some question concerning noncompliance with meds pta.    Neurology consulted. 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 He underwent mechanical thrombectomy with complete recanalization. Postoperative with acute respiratory failure with hypoxemia due to aspiration pneumonitis after massive aspiration on 6/15.. Treated with Rocephin and required vent support with PCCM involved for support. 2 D echo with EF 20 to 25 %. Heparin SQ for VTE prophylaxis. Plans to transition to Eliquis. Chronic a fib rate controlled on Eliquis at home. Also on amiodarone and metoprolol and follow up with Dr Jac Canavan at [facility]. Not on significant heart failure regimen given hypotension and bradycardia. Treated for ileus and felt the cause of large vomitus with aspiration. Hb A1c 5.9. CBG monitoring. Declines use of insulin due to family history of reaction. Extubated on 09/18/21.    Complete NIHSS TOTAL: 0   Patient's medical record from [facility]  has been reviewed by the rehabilitation admission coordinator and physician.   Past Medical  History      Past Medical History:  Diagnosis Date   Aortic stenosis     Chronic renal impairment, stage 3 (moderate), unspecified whether stage 3a or 3b CKD ([name])     Diabetes ([name])     HFrEF (heart failure with reduced ejection fraction) ([place])     Hypothyroidism     PE (pulmonary thromboembolism) ([place]) 2022    Has the patient had major surgery during 100 days prior to admission? No   Family History   family history is not on file.   Current Medications   Current Facility-Administered Medications:    0.9 %  sodium chloride infusion, 250 mL, Intravenous, Continuous, Corey Harold, NP, Stopped at 09/16/21 1548   acetaminophen (TYLENOL) tablet 650 mg, 650 mg, Oral, Q4H PRN **OR** acetaminophen (TYLENOL) 160 MG/5ML solution 650 mg, 650 mg, Per  Tube, Q4H PRN **OR** acetaminophen (TYLENOL) suppository 650 mg, 650 mg, Rectal, Q4H PRN, Theda Sers, Unk Pinto, MD   apixaban (ELIQUIS) tablet 5 mg, 5 mg, Oral, BID, Leonie Man, Pramod S, MD, 5 mg at 09/22/21 7824   atorvastatin (LIPITOR) tablet 40 mg, 40 mg, Oral, Daily, McQuaid, Douglas B, MD, 40 mg at 09/22/21 2353   bisacodyl (DULCOLAX) suppository 10 mg, 10 mg, Rectal, Daily PRN, Simonne Maffucci B, MD   carvedilol (COREG) tablet 3.125 mg, 3.125 mg, Oral, BID WC, Antony Contras S, MD, 3.125 mg at 09/22/21 6144   Chlorhexidine Gluconate Cloth 2 % PADS 6 each, 6 each, Topical, Daily, Gwinda Maine, MD, 6 each at 09/22/21 0807   feeding supplement (ENSURE ENLIVE / ENSURE PLUS) liquid 237 mL, 237 mL, Oral, TID BM, McQuaid, Douglas B, MD, 237 mL at 09/22/21 1342   hydrALAZINE (APRESOLINE) injection 10-20 mg, 10-20 mg, Intravenous, Q4H PRN, Nallamothu, Ravindra N, MD, 20 mg at 09/19/21 0749   insulin aspart (novoLOG) injection 0-15 Units, 0-15 Units, Subcutaneous, TID WC, McQuaid, Douglas B, MD, 2 Units at 09/21/21 1212   insulin aspart (novoLOG) injection 0-5 Units, 0-5 Units, Subcutaneous, QHS, McQuaid, Douglas B, MD   levothyroxine (SYNTHROID) tablet 75 mcg, 75 mcg, Oral, Q0600, Simonne Maffucci B, MD, 75 mcg at 09/22/21 3154   multivitamin with minerals tablet 1 tablet, 1 tablet, Oral, Daily, Simonne Maffucci B, MD, 1 tablet at 09/22/21 0806   Oral care mouth rinse, 15 mL, Mouth Rinse, PRN, Nallamothu, Ravindra N, MD, 15 mL at 09/20/21 0511   polyethylene glycol (MIRALAX / GLYCOLAX) packet 17 g, 17 g, Oral, Daily, McQuaid, Douglas B, MD, 17 g at 09/22/21 0086   senna (SENOKOT) tablet 17.2 mg, 2 tablet, Oral, BID, McQuaid, Douglas B, MD, 17.2 mg at 09/22/21 0806   sodium chloride flush (NS) 0.9 % injection 10-40 mL, 10-40 mL, Intracatheter, Q12H, Elgergawy, Silver Huguenin, MD, 10 mL at 09/22/21 0806   sodium chloride flush (NS) 0.9 % injection 10-40 mL, 10-40 mL, Intracatheter, PRN, Elgergawy, Silver Huguenin, MD,  30 mL at 09/20/21 1943   Patients Current Diet:  Diet Order                  DIET DYS 3 Room service appropriate? Yes with Assist; Fluid consistency: Thin  Diet effective now                       Precautions / Restrictions Precautions Precautions: Fall Precaution Comments: monitor O2 Restrictions Weight Bearing Restrictions: No    Has the patient had 2 or more falls or a fall with injury in the  past year? No   Prior Activity Level Community (5-7x/wk): Independent , active and driving   Prior Functional Level Self Care: Did the patient need help bathing, dressing, using the toilet or eating? Independent   Indoor Mobility: Did the patient need assistance with walking from room to room (with or without device)? Independent   Stairs: Did the patient need assistance with internal or external stairs (with or without device)? Independent   Functional Cognition: Did the patient need help planning regular tasks such as shopping or remembering to take medications? Independent   Patient Information Are you of Hispanic, Latino/a,or Spanish origin?: A. No, not of Hispanic, Latino/a, or Spanish origin What is your race?: A. White Do you need or want an interpreter to communicate with a doctor or health care staff?: 0. No   Patient's Response To:  Health Literacy and Transportation Is the patient able to respond to health literacy and transportation needs?: Yes Health Literacy - How often do you need to have someone help you when you read instructions, pamphlets, or other written material from your doctor or pharmacy?: Never In the past 12 months, has lack of transportation kept you from medical appointments or from getting medications?: No In the past 12 months, has lack of transportation kept you from meetings, work, or from getting things needed for daily living?: No   Development worker, international aid / Independence: Radiation protection practitioner (4 wheels), Cane - single point, Civil engineer, contracting,  Wheelchair - power   Prior Device Use: Indicate devices/aids used by the patient prior to current illness, exacerbation or injury? None of the above   Current Functional Level Cognition   Arousal/Alertness: Lethargic Overall Cognitive Status: Impaired/Different from baseline Difficult to assess due to: Impaired communication (Voice very low volume) Orientation Level: Oriented X4 Following Commands: Follows one step commands consistently, Follows multi-step commands with increased time Safety/Judgement: Decreased awareness of safety, Decreased awareness of deficits General Comments: pt continues to initiation movement with some impulsivity however suspect this to pt's independent mentality, pt appears more alert and interactive today with more energy Attention: Selective Selective Attention: Impaired Selective Attention Impairment: Verbal basic Awareness: Impaired    Extremity Assessment (includes Sensation/Coordination)   Upper Extremity Assessment: RUE deficits/detail RUE Deficits / Details: 4-/5 shoulder mildly weaker than L side. 4+/5 biceps/triceps/grip strength RUE Coordination: decreased fine motor  Lower Extremity Assessment: Defer to PT evaluation RLE Deficits / Details: Excellently improved, can move weakly in isolated movments grossly 4/5 LLE Deficits / Details: generally functional and strong at 4+/5     ADLs   Overall ADL's : Needs assistance/impaired Eating/Feeding: NPO Grooming: Minimal assistance, Standing, Oral care, Wash/dry face Grooming Details (indicate cue type and reason): leaning forward on sink significantly to complete denture care. noted fatigue though pt declined need to sit down. assist to open packaging and place toothpaste on toothbrush due to poor bimanual manipulation/balance deficits. able to brush hair thoroughly while seated in recliner at end of session Upper Body Bathing: Moderate assistance, Sitting Lower Body Bathing: Maximal assistance, Sit  to/from stand Upper Body Dressing : Minimal assistance, Sitting Lower Body Dressing: Maximal assistance, Sit to/from stand Toilet Transfer: Moderate assistance, +2 for safety/equipment, Stand-pivot, BSC/3in1 Toileting- Clothing Manipulation and Hygiene: Maximal assistance, Sitting/lateral lean, Sit to/from stand Functional mobility during ADLs: Minimal assistance, Rolling walker (2 wheels), Cueing for sequencing, Cueing for safety General ADL Comments: Emphasis on standing tolerance and coordination with ADLs at sink, as well as mobility in room with RW.     Mobility  Overal bed mobility: Needs Assistance Bed Mobility: Sit to Supine Rolling: Mod assist Sidelying to sit: Mod assist, +2 for safety/equipment, HOB elevated Supine to sit: Min assist Sit to supine: Min guard General bed mobility comments: pt up in chair upon PT arrival     Transfers   Overall transfer level: Needs assistance Equipment used: Rolling walker (2 wheels) Transfers: Sit to/from Stand Sit to Stand: Min assist Bed to/from chair/wheelchair/BSC transfer type:: Via Lift equipment Step pivot transfers: Mod assist, +2 safety/equipment Transfer via Lift Equipment: Stedy General transfer comment: minA to power up, increased time, verbal cues for safe hand placement     Ambulation / Gait / Stairs / Wheelchair Mobility   Ambulation/Gait Ambulation/Gait assistance: Mod assist Gait Distance (Feet): 120 Feet (x2) Assistive device: Rolling walker (2 wheels) Gait Pattern/deviations: Step-through pattern, Decreased stride length, Trunk flexed, Narrow base of support General Gait Details: pt much improved upright posture compared to yesterday, verbal cues to maintain with onset of fatigue. verbal cues to increased step length and height, difficult to maintain but did attempt with all verbal cues. pt took 1 2 minute seated rest break between 2 amb bouts, SpO2 >88% on RA, denies SOB Gait velocity: slow Gait velocity  interpretation: <1.31 ft/sec, indicative of household ambulator Pre-gait activities: Stood x 2 with Stedy x 30 seconds with verbal/tactile cues to extend hips/trunk     Posture / Balance Dynamic Sitting Balance Sitting balance - Comments: posterior LOB with dynamic tasks Balance Overall balance assessment: Needs assistance Sitting-balance support: Feet supported, No upper extremity supported Sitting balance-Leahy Scale: Fair Sitting balance - Comments: posterior LOB with dynamic tasks Postural control: Posterior lean Standing balance support: During functional activity, Bilateral upper extremity supported Standing balance-Leahy Scale: Fair Standing balance comment: external support and UE's on AD     Special needs/care consideration Hgb A1c 5.9    Previous Home Environment  Living Arrangements: Spouse/significant other  Lives With: Spouse (spouse is 63 years old) Available Help at Discharge: Family, Available 24 hours/day (daughter Angie local and can help prn; Wife 83 years old and very HOH,stepdaughter can assist wife prn) Type of Home: Mobile home Home Layout: One level Home Access: Stairs to enter, Ramped entrance Entrance Stairs-Number of Steps: 5 steps at front and ramp at back Bathroom Shower/Tub: Chiropodist: Standard Bathroom Accessibility: Yes How Accessible: Accessible via walker Home Care Services: No Additional Comments: DME belongs to pt's wife   Discharge Living Setting Plans for Discharge Living Setting: Patient's home, Lives with (comment), Mobile Home (wife) Type of Home at Discharge: Mobile home Discharge Home Layout: One level Discharge Home Access: Stairs to enter, Ramped entrance (5 steps front, ramp in back) Entrance Stairs-Number of Steps: 5 steps in front and ramp in back Discharge Bathroom Shower/Tub: Tub/shower unit Discharge Bathroom Toilet: Standard Discharge Bathroom Accessibility: Yes How Accessible: Accessible via  walker Does the patient have any problems obtaining your medications?: No   Social/Family/Support Systems Patient Roles: Spouse Contact Information: daughter, Angie Anticipated Caregiver: angie can provide prn assist Anticipated Caregiver's Contact Information: see contacts Ability/Limitations of Caregiver: spouse is 79 year old and very HOH, Stepdtr assisting prn with her. Daughter, [name], can asisst prn Caregiver Availability: Intermittent Discharge Plan Discussed with Primary Caregiver: Yes Is Caregiver In Agreement with Plan?: Yes Does Caregiver/Family have Issues with Lodging/Transportation while Pt is in Rehab?: No   Goals Patient/Family Goal for Rehab: Mod I to intermittent assist with PT, OT and SLP Expected length of stay: ELOS 10 to  12 days Pt/Family Agrees to Admission and willing to participate: Yes Program Orientation Provided & Reviewed with Pt/Caregiver Including Roles  & Responsibilities: Yes   Decrease burden of Care through IP rehab admission: n/a   Possible need for SNF placement upon discharge: not anticipated   Patient Condition: I have reviewed medical records from [facility], spoken with CM, and patient, spouse, daughter, and family member. I met with patient at the bedside for inpatient rehabilitation assessment.  Patient will benefit from ongoing PT, OT, and SLP, can actively participate in 3 hours of therapy a day 5 days of the week, and can make measurable gains during the admission.  Patient will also benefit from the coordinated team approach during an Inpatient Acute Rehabilitation admission.  The patient will receive intensive therapy as well as Rehabilitation physician, nursing, social worker, and care management interventions.  Due to bladder management, bowel management, safety, skin/wound care, disease management, medication administration, pain management, and patient education the patient requires 24 hour a day rehabilitation nursing.  The patient  is currently mod assist overall with mobility and basic ADLs.  Discharge setting and therapy post discharge at home with home health is anticipated.  Patient has agreed to participate in the Acute Inpatient Rehabilitation Program and will admit today.   Preadmission Screen Completed By:  Cleatrice Burke, 09/22/2021 1:51 PM ______________________________________________________________________   Discussed status with Dr. Curlene Dolphin on 09/22/21 at 1413 and received approval for admission today.   Admission Coordinator:  Cleatrice Burke, RN, time 3545 Date 09/22/2021    Assessment/Plan: Diagnosis:L MCA CVA Does the need for close, 24 hr/day Medical supervision in concert with the patient's rehab needs make it unreasonable for this patient to be served in a less intensive setting? Yes Co-Morbidities requiring supervision/potential complications: aortic stenosis with AVR, Chronic renal disease stage 3, DM, heart failure,  Afib on Eliquis,hypothyroidism and PE Due to bladder management, bowel management, safety, skin/wound care, disease management, medication administration, pain management, and patient education, does the patient require 24 hr/day rehab nursing? Yes Does the patient require coordinated care of a physician, rehab nurse, PT, OT, and SLP to address physical and functional deficits in the context of the above medical diagnosis(es)? Yes Addressing deficits in the following areas: balance, endurance, locomotion, strength, transferring, bowel/bladder control, bathing, dressing, feeding, grooming, toileting, cognition, speech, language, swallowing, and psychosocial support Can the patient actively participate in an intensive therapy program of at least 3 hrs of therapy 5 days a week? Yes The potential for patient to make measurable gains while on inpatient rehab is excellent Anticipated functional outcomes upon discharge from inpatient rehab: modified independent, supervision, and  min assist PT, modified independent, supervision, and min assist OT, modified independent, supervision, and min assist SLP Estimated rehab length of stay to reach the above functional goals is: 10-12 Anticipated discharge destination: Home 10. Overall Rehab/Functional Prognosis: excellent     MD Signature: Jennye Boroughs         Revision History                      Note Details  Traci Sermon, MD File Time 09/22/2021  2:17 PM  Author Type Physician Status Signed  Last Editor Jennye Boroughs, MD Service Physical Medicine and Rehabilitation

## 2021-09-22 NOTE — Progress Notes (Signed)
Physical Therapy Treatment Patient Details Name: Eugene Campbell MRN: [id number] DOB: 14-Jan-1941 Today's Date: 09/22/2021   History of Present Illness Pt is an 81 y/o male who presented with sudden onset of R sided weakness, facial droop and aphasia. 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 Pt remained intubated after procedure with complications due to acute hypoxic respiratory failure and massive aspiration. Pt extubated 6/17. PMH: a fib, HTN, PT and DVT, clotting disorder, DM2    PT Comments    Pt progressing well towards all goals. Pt more alert and interactive today. Pt with much improved ambulation tolerance and ability to maintain upright posture. Pt remains dependent on RW and at high falls risk. Pt to continue to benefit from AIR upon d/c as pt was indep and not using AD PTA. Acute PT to cont to follow.    Recommendations for follow up therapy are one component of a multi-disciplinary discharge planning process, led by the attending physician.  Recommendations may be updated based on patient status, additional functional criteria and insurance authorization.  Follow Up Recommendations  Acute inpatient rehab (3hours/day)     Assistance Recommended at Discharge Frequent or constant Supervision/Assistance  Patient can return home with the following A little help with walking and/or transfers;A little help with bathing/dressing/bathroom;Assistance with cooking/housework;Assist for transportation;Help with stairs or ramp for entrance   Equipment Recommendations   (TBA)    Recommendations for Other Services Rehab consult     Precautions / Restrictions Precautions Precautions: Fall Precaution Comments: monitor O2 Restrictions Weight Bearing Restrictions: No     Mobility  Bed Mobility Overal bed mobility: Needs Assistance Bed Mobility: Sit to Supine Rolling: Mod  assist Sidelying to sit: Mod assist, +2 for safety/equipment, HOB elevated Supine to sit: Min assist Sit to supine: Min guard   General bed mobility comments: pt up in chair upon PT arrival    Transfers Overall transfer level: Needs assistance Equipment used: Rolling walker (2 wheels) Transfers: Sit to/from Stand Sit to Stand: Min assist   Step pivot transfers: Mod assist, +2 safety/equipment       General transfer comment: minA to power up, increased time, verbal cues for safe hand placement Transfer via Lift Equipment: Stedy  Ambulation/Gait Ambulation/Gait assistance: Mod assist Gait Distance (Feet): 120 Feet (x2) Assistive device: Rolling walker (2 wheels) Gait Pattern/deviations: Step-through pattern, Decreased stride length, Trunk flexed, Narrow base of support Gait velocity: slow Gait velocity interpretation: <1.31 ft/sec, indicative of household ambulator   General Gait Details: pt much improved upright posture compared to yesterday, verbal cues to maintain with onset of fatigue. verbal cues to increased step length and height, difficult to maintain but did attempt with all verbal cues. pt took 1 2 minute seated rest break between 2 amb bouts, SpO2 >88% on RA, denies SOB   Stairs             Wheelchair Mobility    Modified Rankin (Stroke Patients Only) Modified Rankin (Stroke Patients Only) Pre-Morbid Rankin Score: No symptoms Modified Rankin: Moderately severe disability     Balance Overall balance assessment: Needs assistance Sitting-balance support: Feet supported, No upper extremity supported Sitting balance-Leahy Scale: Fair Sitting balance - Comments: posterior LOB with dynamic tasks Postural control: Posterior lean Standing balance support: During functional activity, Bilateral upper extremity supported Standing balance-Leahy Scale: Fair Standing balance comment: external support and UE's on AD  Cognition  Arousal/Alertness: Awake/alert Behavior During Therapy: Impulsive Overall Cognitive Status: Impaired/Different from baseline Area of Impairment: Following commands, Safety/judgement, Awareness, Problem solving                       Following Commands: Follows one step commands consistently, Follows multi-step commands with increased time Safety/Judgement: Decreased awareness of safety, Decreased awareness of deficits Awareness: Emergent Problem Solving: Slow processing, Requires verbal cues, Requires tactile cues General Comments: pt continues to initiation movement with some impulsivity however suspect this to pt's independent mentality, pt appears more alert and interactive today with more energy        Exercises      General Comments General comments (skin integrity, edema, etc.): SpO2 >88% on RA t/o amb, denies SOB      Pertinent Vitals/Pain Pain Assessment Pain Assessment: No/denies pain    Home Living                          Prior Function            PT Goals (current goals can now be found in the care plan section) Acute Rehab PT Goals Patient Stated Goal: pt unable due to intubated, wife wants pt to be active as usual. PT Goal Formulation: With patient/family Time For Goal Achievement: 10/01/21 Potential to Achieve Goals: Good Progress towards PT goals: Progressing toward goals    Frequency    Min 4X/week      PT Plan Current plan remains appropriate    Co-evaluation              AM-PAC PT "6 Clicks" Mobility   Outcome Measure  Help needed turning from your back to your side while in a flat bed without using bedrails?: A Little Help needed moving from lying on your back to sitting on the side of a flat bed without using bedrails?: A Little Help needed moving to and from a bed to a chair (including a wheelchair)?: A Little Help needed standing up from a chair using your arms (e.g., wheelchair or bedside chair)?: A Little Help  needed to walk in hospital room?: A Lot Help needed climbing 3-5 steps with a railing? : A Lot 6 Click Score: 16    End of Session Equipment Utilized During Treatment: Gait belt Activity Tolerance: Patient tolerated treatment well Patient left: in chair;with call bell/phone within reach;with family/visitor present;with chair alarm set Nurse Communication: Mobility status PT Visit Diagnosis: Other abnormalities of gait and mobility (R26.89);Difficulty in walking, not elsewhere classified (R26.2);Other symptoms and signs involving the nervous system (R29.898)     Time: 9604-5409 PT Time Calculation (min) (ACUTE ONLY): 21 min  Charges:  $Gait Training: 8-22 mins                     Lewis Shock, PT, DPT Acute Rehabilitation Services Secure chat preferred Office #: [phone]    Iona Hansen 09/22/2021, 12:27 PM

## 2021-09-22 NOTE — H&P (Signed)
Physical Medicine and Rehabilitation Admission H&P    CC: Functional deficits due to left MCA infarcts  HPI: Eugene Campbell is an 81 year old male who presented to the emergency department who developed sudden onset of right arm and leg weakness with right-sided facial droop and aphasia on 09/15/2021.  His wife alerted EMS.  He is maintained on Eliquis secondary to history of PE, chronic DVT and chronic A-fib.  He was therefore not a candidate for TNK.  CT head and CT angio head performed with evidence of hyperdense left M1 MCA segment highly suspicious for the presence of endoluminal thrombus.  Dr. Thomasena Edis, neurology was consulted and interventional neuroradiology consulted regarding mechanical thrombectomy.  He underwent diagnostic cerebral angiogram on 6/14 with mechanical thrombectomy with complete recanalization of the MCA.  He was intubated for the procedure and vomited during pressure support and developed aspiration pneumonitis.  Developed acute respiratory failure with hypoxemia.  Required Levophed and heparin IV.  2D echo performed revealed ejection fraction estimated 20 to 25%.  His hemoglobin A1c is 5.9.  Given heparin subcutaneously for VTE prophylaxis.  Initially given aspirin 325 mg daily, then transitioned from IV heparin to Eliquis.  Remained on Rocephin for aspiration pneumonia.  He developed an ileus and was kept on bowel rest with orogastric tube to suction.  MRI of the brain performed 6/15 revealed patchy acute infarct in the left basal ganglia and left medial temporal lobe, recanalized left MCA M1 segment and patent left MCA bifurcation.  He improved over the next several days and was off vasopressors and following commands. He was subsequently extubated on 6/17.  He was transferred out of the ICU on 6/19. He reports his right arm and leg continue to be a little weaker than the left.  He is on DYS 3/thin diet. The patient requires inpatient medicine and rehabilitation evaluations and  services for ongoing dysfunction secondary to left MCA infarcts.  Denies alcohol and tobacco use. Teaches Sunday school. Lives with wife, Eugene Campbell.   Review of Systems  Constitutional:  Negative for chills and fever.  HENT:  Negative for congestion.        Poor fitting dentures prevent him from chewing well  Eyes:  Negative for blurred vision.  Respiratory:  Positive for cough.        S/o cough with oral intake  Cardiovascular:  Negative for chest pain and palpitations.  Gastrointestinal:  Positive for constipation. Negative for abdominal pain, nausea and vomiting.  Genitourinary:  Positive for urgency.       External urinary  catheter  Skin:  Negative for itching and rash.  Neurological:  Positive for dizziness. Negative for headaches.  Psychiatric/Behavioral:  Negative for depression.    Past Medical History:  Diagnosis Date   Aortic stenosis    Chronic renal impairment, stage 3 (moderate), unspecified whether stage 3a or 3b CKD ([name])    Diabetes ([name])    HFrEF (heart failure with reduced ejection fraction) ([name])    Hypothyroidism    PE (pulmonary thromboembolism) ([name]) 2022   Past Surgical History:  Procedure Laterality Date   AORTIC VALVE REPLACEMENT     CHOLECYSTECTOMY     IR CT HEAD LTD  09/15/2021   IR PERCUTANEOUS ART THROMBECTOMY/INFUSION INTRACRANIAL INC DIAG ANGIO  09/15/2021   IR US GUIDE VASC ACCESS RIGHT  09/15/2021   RADIOLOGY WITH ANESTHESIA N/A 09/15/2021   Procedure: IR WITH ANESTHESIA;  Surgeon: Radiologist, Medication, MD;  Location: [facility] OR;  Service: Radiology;  Laterality: N/A;  History reviewed. No pertinent family history. Social History:  reports that he quit smoking about 50 years ago. His smoking use included cigarettes. He has a 13.00 pack-year smoking history. He does not have any smokeless tobacco history on file. He reports that he does not drink alcohol. No history on file for drug use. Allergies:  Allergies  Allergen Reactions   Penicillins Hives,  Swelling, Rash and Other (See Comments)    Peeling skin and caused the gums to peel/crack   Medications Prior to Admission  Medication Sig Dispense Refill   B Complex Vitamins (VITAMIN B COMPLEX) TABS Take 1 tablet by mouth daily with breakfast.     Cholecalciferol (VITAMIN D3) 25 MCG (1000 UT) CAPS Take 1,000 Units by mouth daily.     Coenzyme Q10 (COQ-10 PO) Take 1 capsule by mouth daily.     Cyanocobalamin (VITAMIN B-12) 1000 MCG SUBL Place 1,000 mcg under the tongue daily.     ELIQUIS 5 MG TABS tablet Take 5 mg by mouth 2 (two) times daily.     ferrous sulfate 325 (65 FE) MG tablet Take 325 mg by mouth daily with breakfast.     furosemide (LASIX) 40 MG tablet Take 40 mg by mouth in the morning.     levothyroxine (SYNTHROID) 75 MCG tablet Take 75 mcg by mouth See admin instructions. Take 75 mcg by mouth before breakfast on Mon/Tues/Wed/Thurs/Fri        Home: Home Living Family/patient expects to be discharged to:: Private residence Living Arrangements: Spouse/significant other Available Help at Discharge: Family Type of Home: Mobile home Home Access: Stairs to enter, Ramped entrance Technical brewer of Steps: 5 steps at front and ramp at back [place]: One level Bathroom Shower/Tub: Chiropodist: [place]: Rollator (4 wheels), Cane - single point, Civil engineer, contracting, Wheelchair - power Additional Comments: DME belongs to pt's wife   Functional History: Prior Function Prior Level of Function : Independent/Modified Independent Mobility Comments: no use of AD ADLs Comments: Per wife, pt "hard worker"; Works in yard, Information systems manager Status:  Mobility: Bed Mobility Overal bed mobility: Needs Assistance Bed Mobility: Sit to Supine Rolling: Mod assist Sidelying to sit: Mod assist, +2 for safety/equipment, HOB elevated Supine to sit: Min assist Sit to supine: Min guard General bed mobility comments: pt up in chair upon PT  arrival Transfers Overall transfer level: Needs assistance Equipment used: Rolling walker (2 wheels) Transfers: Sit to/from Stand Sit to Stand: Min assist Bed to/from chair/wheelchair/BSC transfer type:: Via Lift equipment Step pivot transfers: Mod assist, +2 safety/equipment Transfer via Lift Equipment: Stedy General transfer comment: minA to power up, increased time, verbal cues for safe hand placement Ambulation/Gait Ambulation/Gait assistance: Mod assist Gait Distance (Feet): 120 Feet (x2) Assistive device: Rolling walker (2 wheels) Gait Pattern/deviations: Step-through pattern, Decreased stride length, Trunk flexed, Narrow base of support General Gait Details: pt much improved upright posture compared to yesterday, verbal cues to maintain with onset of fatigue. verbal cues to increased step length and height, difficult to maintain but did attempt with all verbal cues. pt took 1 2 minute seated rest break between 2 amb bouts, SpO2 >88% on RA, denies SOB Gait velocity: slow Gait velocity interpretation: <1.31 ft/sec, indicative of household ambulator Pre-gait activities: Stood x 2 with Stedy x 30 seconds with verbal/tactile cues to extend hips/trunk    ADL: ADL Overall ADL's : Needs assistance/impaired Eating/Feeding: NPO Grooming: Minimal assistance, Standing, Oral care, Wash/dry face Grooming Details (indicate cue type and  reason): leaning forward on sink significantly to complete denture care. noted fatigue though pt declined need to sit down. assist to open packaging and place toothpaste on toothbrush due to poor bimanual manipulation/balance deficits. able to brush hair thoroughly while seated in recliner at end of session Upper Body Bathing: Moderate assistance, Sitting Lower Body Bathing: Maximal assistance, Sit to/from stand Upper Body Dressing : Minimal assistance, Sitting Lower Body Dressing: Maximal assistance, Sit to/from stand Toilet Transfer: Moderate assistance, +2  for safety/equipment, Stand-pivot, BSC/3in1 Toileting- Clothing Manipulation and Hygiene: Maximal assistance, Sitting/lateral lean, Sit to/from stand Functional mobility during ADLs: Minimal assistance, Rolling walker (2 wheels), Cueing for sequencing, Cueing for safety General ADL Comments: Emphasis on standing tolerance and coordination with ADLs at sink, as well as mobility in room with RW.  Cognition: Cognition Overall Cognitive Status: Impaired/Different from baseline Arousal/Alertness: Lethargic Orientation Level: Oriented X4 Attention: Selective Selective Attention: Impaired Selective Attention Impairment: Verbal basic Awareness: Impaired Cognition Arousal/Alertness: Awake/alert Behavior During Therapy: Impulsive Overall Cognitive Status: Impaired/Different from baseline Area of Impairment: Following commands, Safety/judgement, Awareness, Problem solving Following Commands: Follows one step commands consistently, Follows multi-step commands with increased time Safety/Judgement: Decreased awareness of safety, Decreased awareness of deficits Awareness: Emergent Problem Solving: Slow processing, Requires verbal cues, Requires tactile cues General Comments: pt continues to initiation movement with some impulsivity however suspect this to pt's independent mentality, pt appears more alert and interactive today with more energy Difficult to assess due to: Impaired communication (Voice very low volume)  Physical Exam: Blood pressure 122/70, pulse (!) 44, temperature 98.2 F (36.8 C), resp. rate 18, height 6\' 1"  (1.854 m), weight 75.2 kg, SpO2 93 %. Physical Exam   General: Alert and oriented x 4, No apparent distress HEENT: Head is normocephalic, atraumatic, PERRLA, EOMI, sclera anicteric, oral mucosa pink and moist, dentition intact  Neck: Supple without JVD or lymphadenopathy Heart: IRR. No murmurs rubs or gallops Chest: CTA bilaterally without wheezes, rales, or rhonchi; no  distress Abdomen: Soft, non-tender, non-distended, bowel sounds positive. Extremities: No clubbing, cyanosis, or edema. Pulses are 2+ Psych: Pt's affect is appropriate. Pt is cooperative Skin: Clean and intact without signs of breakdown GU: external cath draining yellow urine Neuro: Alert,  follows commands, mild impulsivity and decreased safety awareness noted, able to name objects, able to repeat correctly, sensation to light touch intact in all 4 extremities, cranial nerves II through XII intact other than right mild facial droop, DTR 2+ and symmetric at knee, ankle, BR, Tri, Bic, mild dysarthria, finger-to-nose a little slower on the right, minimal drift bilaterally Strength 5 out of 5 in left upper extremity and lower extremity Strength 4+ out of 5 proximal right upper extremity 4+ -5 out of 5 distal right upper extremity Strength 4+ out of 5 proximal right lower extremity, 5 out of 5 distal right lower extremity   Musculoskeletal: No increased tone, no range of motion deficits noted, no joint swelling or tenderness noted  Results for orders placed or performed during the hospital encounter of 09/15/21 (from the past 48 hour(s))  Glucose, capillary     Status: None   Collection Time: 09/20/21  5:44 PM  Result Value Ref Range   Glucose-Capillary 85 70 - 99 mg/dL    Comment: Glucose reference range applies only to samples taken after fasting for at least 8 hours.  Glucose, capillary     Status: Abnormal   Collection Time: 09/20/21 10:20 PM  Result Value Ref Range   Glucose-Capillary 122 (H) 70 - 99  mg/dL    Comment: Glucose reference range applies only to samples taken after fasting for at least 8 hours.  CBC     Status: Abnormal   Collection Time: 09/21/21  1:22 AM  Result Value Ref Range   WBC 5.5 4.0 - 10.5 K/uL   RBC 3.74 (L) 4.22 - 5.81 MIL/uL   Hemoglobin 12.1 (L) 13.0 - 17.0 g/dL   HCT 39.0 (L) 30.0 - 92.3 %   MCV 97.9 80.0 - 100.0 fL   MCH 32.4 26.0 - 34.0 pg   MCHC 33.1  30.0 - 36.0 g/dL   RDW 30.0 76.2 - 26.3 %   Platelets 64 (L) 150 - 400 K/uL    Comment: Immature Platelet Fraction may be clinically indicated, consider ordering this additional test [id number]    nRBC 0.0 0.0 - 0.2 %    Comment: Performed at [facility], [address], [place], [place] [address]  Basic metabolic panel     Status: Abnormal   Collection Time: 09/21/21  1:22 AM  Result Value Ref Range   Sodium 140 135 - 145 mmol/L    Comment: DELTA CHECK NOTED   Potassium 3.8 3.5 - 5.1 mmol/L   Chloride 105 98 - 111 mmol/L   CO2 27 22 - 32 mmol/L   Glucose, Bld 101 (H) 70 - 99 mg/dL    Comment: Glucose reference range applies only to samples taken after fasting for at least 8 hours.   BUN 21 8 - 23 mg/dL   Creatinine, Ser 7.34 0.61 - 1.24 mg/dL   Calcium 8.5 (L) 8.9 - 10.3 mg/dL   GFR, Estimated >28 >76 mL/min    Comment: (NOTE) Calculated using the CKD-EPI Creatinine Equation (2021)    Anion gap 8 5 - 15    Comment: Performed at [facility], [address], [place], [place] [address]  Glucose, capillary     Status: Abnormal   Collection Time: 09/21/21  6:14 AM  Result Value Ref Range   Glucose-Capillary 114 (H) 70 - 99 mg/dL    Comment: Glucose reference range applies only to samples taken after fasting for at least 8 hours.  Glucose, capillary     Status: Abnormal   Collection Time: 09/21/21 11:55 AM  Result Value Ref Range   Glucose-Capillary 143 (H) 70 - 99 mg/dL    Comment: Glucose reference range applies only to samples taken after fasting for at least 8 hours.  Glucose, capillary     Status: Abnormal   Collection Time: 09/21/21  4:39 PM  Result Value Ref Range   Glucose-Capillary 136 (H) 70 - 99 mg/dL    Comment: Glucose reference range applies only to samples taken after fasting for at least 8 hours.   Comment 1 Notify RN    Comment 2 Document in Chart   Glucose, capillary     Status: Abnormal   Collection Time: 09/21/21  9:24 PM  Result Value Ref  Range   Glucose-Capillary 118 (H) 70 - 99 mg/dL    Comment: Glucose reference range applies only to samples taken after fasting for at least 8 hours.   Comment 1 Notify RN    Comment 2 Document in Chart   Glucose, capillary     Status: Abnormal   Collection Time: 09/22/21  6:16 AM  Result Value Ref Range   Glucose-Capillary 195 (H) 70 - 99 mg/dL    Comment: Glucose reference range applies only to samples taken after fasting for at  least 8 hours.   Comment 1 Notify RN    Comment 2 Document in Chart   Glucose, capillary     Status: Abnormal   Collection Time: 09/22/21 11:40 AM  Result Value Ref Range   Glucose-Capillary 162 (H) 70 - 99 mg/dL    Comment: Glucose reference range applies only to samples taken after fasting for at least 8 hours.   No results found.    Blood pressure 122/70, pulse (!) 44, temperature 98.2 F (36.8 C), resp. rate 18, height 6\' 1"  (1.854 m), weight 75.2 kg, SpO2 93 %.  Medical Problem List and Plan: 1. 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  -patient may shower  -ELOS/Goals: 10-12 days,  Mod I to intermittent assist with PT, OT and SLP  -Admit to CIR 2.  Antithrombotics: -DVT/anticoagulation:  TED hose -on Eliquis  -antiplatelet therapy: none 3. Pain Management: Tylenol prn 4. Mood/Sleep: LCSW to evaluate and provide emotional support  -antipsychotic agents: n/a 5. Neuropsych/cognition: This patient is capable of making decisions on his own behalf. 6. Skin/Wound Care: Routine skin care checks 7. Fluids/Electrolytes/Nutrition: Routine I's and O's and follow-up chemistries  --dysphagia 3 diet/thins 8: Respiratory failure/aspiration pneumonia: extubated 6/17  -Denies shortness of breath, on room air 9: Chronic atrial fibrillation: continue Eliquis, Coreg  -- follows with Dr. 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 Dysphagia Dys 3/thin diet  -Continue SLP    I have personally performed a face to face diagnostic evaluation of this patient and formulated the key components of the plan.  Additionally, I have personally reviewed laboratory data, imaging studies, as well as relevant notes and concur with the physician assistant's documentation above.  The patient's status has not changed from the original H&P.  Any changes in documentation from the acute care chart have been noted above.  Jennye Boroughs, MD, FAAPMR   Barbie Banner, PA-C 09/22/2021

## 2021-09-22 NOTE — Progress Notes (Signed)
Speech Language Pathology Treatment: Dysphagia  Patient Details Name: Eugene Campbell MRN: [id number] DOB: 05-06-1940 Today's Date: 09/22/2021 Time: 8466-5993 SLP Time Calculation (min) (ACUTE ONLY): 32 min  Assessment / Plan / Recommendation Clinical Impression  Pt seen for ongoing dysphagia treatment with RN, pt's wife and wife's daughter reporting new coughing with liquids and difficulty with mastication. Pt upright in chair and alert, also reporting some difficulty masticating regular meal tray items. He self-fed consecutive sips of thin liquids by straw demonstrating intermittent and inconsistent overt coughing. Min-mod verbal cues for small controlled sips eliminated this presentation entirely. Regular textured cracker was consumed without difficulty. Prolonged mastication and significant oral residuals remained with mechanical soft trial (pear fruit cup). He was unable to fully clear despite liquid wash and bites of puree. Wife's daughter reports that he had some difficulty with pears within the past 24 hours, but that he reports consuming a banana and cornflake cereal without difficulty this am. Pt presented with delayed coughing minutes post intake and unsure of relation to POs. Recommend mechanical soft diet/thin liquids. He will require full supervision for meals for small bites and small sips of liquids. Will f/u for tolerance given pt has had fluctuating/inconsistent performance with swallowing.   HPI HPI: Pt is an 81 y/o male who presented with sudden onset of R sided weakness, facial droop and aphasia. 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 Pt remained intubated after procedure with complications due to acute hypoxic respiratory failure and massive aspiration. Pt extubated 6/17. PMH: a fib, HTN, PT and DVT, clotting disorder, DM2      SLP Plan  Continue with current plan of care       Recommendations for follow up therapy are one component of a multi-disciplinary discharge planning process, led by the attending physician.  Recommendations may be updated based on patient status, additional functional criteria and insurance authorization.    Recommendations  Diet recommendations: Dysphagia 3 (mechanical soft);Thin liquid Liquids provided via: Cup;Straw Medication Administration: Whole meds with puree Supervision: Patient able to self feed;Full supervision/cueing for compensatory strategies Compensations: Minimize environmental distractions;Slow rate;Small sips/bites Postural Changes and/or Swallow Maneuvers: Seated upright 90 degrees                Oral Care Recommendations: Oral care BID Follow Up Recommendations: Acute inpatient rehab (3hours/day) Assistance recommended at discharge: Frequent or constant Supervision/Assistance SLP Visit Diagnosis: Dysphagia, unspecified (R13.10) Plan: Continue with current plan of care            Avie Echevaria, MA, CCC-SLP Acute Rehabilitation Services Office Number: [phone]  Paulette Blanch  09/22/2021, 11:15 AM

## 2021-09-23 ENCOUNTER — Inpatient Hospital Stay (HOSPITAL_COMMUNITY): Payer: Medicare HMO

## 2021-09-23 DIAGNOSIS — I63512 Cerebral infarction due to unspecified occlusion or stenosis of left middle cerebral artery: Secondary | ICD-10-CM | POA: Diagnosis not present

## 2021-09-23 LAB — CBC WITH DIFFERENTIAL/PLATELET
Abs Immature Granulocytes: 0.13 10*3/uL — ABNORMAL HIGH (ref 0.00–0.07)
Basophils Absolute: 0 10*3/uL (ref 0.0–0.1)
Basophils Relative: 0 %
Eosinophils Absolute: 0.1 10*3/uL (ref 0.0–0.5)
Eosinophils Relative: 1 %
HCT: 37.1 % — ABNORMAL LOW (ref 39.0–52.0)
Hemoglobin: 12.9 g/dL — ABNORMAL LOW (ref 13.0–17.0)
Immature Granulocytes: 1 %
Lymphocytes Relative: 11 %
Lymphs Abs: 1.2 10*3/uL (ref 0.7–4.0)
MCH: 33.1 pg (ref 26.0–34.0)
MCHC: 34.8 g/dL (ref 30.0–36.0)
MCV: 95.1 fL (ref 80.0–100.0)
Monocytes Absolute: 1.3 10*3/uL — ABNORMAL HIGH (ref 0.1–1.0)
Monocytes Relative: 12 %
Neutro Abs: 8.3 10*3/uL — ABNORMAL HIGH (ref 1.7–7.7)
Neutrophils Relative %: 75 %
Platelets: 101 10*3/uL — ABNORMAL LOW (ref 150–400)
RBC: 3.9 MIL/uL — ABNORMAL LOW (ref 4.22–5.81)
RDW: 13.4 % (ref 11.5–15.5)
WBC: 11 10*3/uL — ABNORMAL HIGH (ref 4.0–10.5)
nRBC: 0 % (ref 0.0–0.2)

## 2021-09-23 LAB — COMPREHENSIVE METABOLIC PANEL
ALT: 37 U/L (ref 0–44)
AST: 30 U/L (ref 15–41)
Albumin: 2 g/dL — ABNORMAL LOW (ref 3.5–5.0)
Alkaline Phosphatase: 79 U/L (ref 38–126)
Anion gap: 12 (ref 5–15)
BUN: 16 mg/dL (ref 8–23)
CO2: 29 mmol/L (ref 22–32)
Calcium: 8.5 mg/dL — ABNORMAL LOW (ref 8.9–10.3)
Chloride: 98 mmol/L (ref 98–111)
Creatinine, Ser: 1.09 mg/dL (ref 0.61–1.24)
GFR, Estimated: >60 mL/min (ref >60)
Glucose, Bld: 123 mg/dL — ABNORMAL HIGH (ref 70–99)
Potassium: 4 mmol/L (ref 3.5–5.1)
Sodium: 139 mmol/L (ref 135–145)
Total Bilirubin: 0.9 mg/dL (ref 0.3–1.2)
Total Protein: 5.1 g/dL — ABNORMAL LOW (ref 6.5–8.1)

## 2021-09-23 LAB — GLUCOSE, CAPILLARY
Glucose-Capillary: 119 mg/dL — ABNORMAL HIGH (ref 70–99)
Glucose-Capillary: 132 mg/dL — ABNORMAL HIGH (ref 70–99)

## 2021-09-23 NOTE — Evaluation (Signed)
Occupational Therapy Assessment and Plan  Patient Details  Name: Eugene Campbell MRN: [id number] Date of Birth: 07/08/1940  OT Diagnosis: abnormal posture, cognitive deficits, hemiplegia affecting dominant side, and muscle weakness (generalized) Rehab Potential: Rehab Potential (ACUTE ONLY): Good ELOS: 1.5-2 weeks   Today's Date: 09/23/2021 OT Individual Time: 6812-7517 OT Individual Time Calculation (min): 70 min     Hospital Problem: Principal Problem:   Acute ischemic left MCA stroke ([name]) Active Problems:   Aspiration pneumonia ([place])   Oropharyngeal dysphagia   Chronic systolic heart failure ([name])   Stage 3 chronic kidney disease ([name])   Drug-induced constipation   Past Medical History:  Past Medical History:  Diagnosis Date   Aortic stenosis    Chronic renal impairment, stage 3 (moderate), unspecified whether stage 3a or 3b CKD ([name])    Diabetes ([name])    HFrEF (heart failure with reduced ejection fraction) ([name])    Hypothyroidism    PE (pulmonary thromboembolism) ([place]) 2022   Past Surgical History:  Past Surgical History:  Procedure Laterality Date   AORTIC VALVE REPLACEMENT     CHOLECYSTECTOMY     IR CT HEAD LTD  09/15/2021   IR PERCUTANEOUS ART THROMBECTOMY/INFUSION INTRACRANIAL INC DIAG ANGIO  09/15/2021   IR US GUIDE VASC ACCESS RIGHT  09/15/2021   RADIOLOGY WITH ANESTHESIA N/A 09/15/2021   Procedure: IR WITH ANESTHESIA;  Surgeon: Radiologist, Medication, MD;  Location: [facility];  Service: Radiology;  Laterality: N/A;    Assessment & Plan Clinical Impression: Patient is a 81 y.o. year old male who presented to the emergency department who developed sudden onset of right arm and leg weakness with right-sided facial droop and aphasia on 09/15/2021.  His wife alerted EMS.  He is maintained on Eliquis secondary to history of PE, chronic DVT and chronic A-fib.  He was therefore not a candidate for TNK.  CT head and CT angio head performed with evidence of hyperdense left M1  MCA segment highly suspicious for the presence of endoluminal thrombus.  Dr. Theda Sers, neurology was consulted and interventional neuroradiology consulted regarding mechanical thrombectomy.  He underwent diagnostic cerebral angiogram on 6/14 with mechanical thrombectomy with complete recanalization of the MCA.  He was intubated for the procedure and vomited during pressure support and developed aspiration pneumonitis.  Developed acute respiratory failure with hypoxemia.  Required Levophed and heparin IV.  2D echo performed revealed ejection fraction estimated 20 to 25%.  His hemoglobin A1c is 5.9.  Given heparin subcutaneously for VTE prophylaxis.  Initially given aspirin 325 mg daily, then transitioned from IV heparin to Eliquis.  Remained on Rocephin for aspiration pneumonia.  He developed an ileus and was kept on bowel rest with orogastric tube to suction.  MRI of the brain performed 6/15 revealed patchy acute infarct in the left basal ganglia and left medial temporal lobe, recanalized left MCA M1 segment and patent left MCA bifurcation.  He improved over the next several days and was off vasopressors and following commands. He was subsequently extubated on 6/17.  He was transferred out of the ICU on 6/19. He reports his right arm and leg continue to be a little weaker than the left.  He is on DYS 3/thin diet. The patient requires inpatient medicine and rehabilitation evaluations and services for ongoing dysfunction secondary to left MCA infarcts..  Patient transferred to CIR on 09/22/2021 .    Patient currently requires min with basic self-care skills secondary to muscle weakness, decreased cardiorespiratoy endurance, decreased coordination, decreased memory, and decreased standing  balance, hemiplegia, and decreased balance strategies.  Prior to hospitalization, patient could complete BADL/IADL/transfers with independent .  Patient will benefit from skilled intervention to decrease level of assist with basic  self-care skills, increase independence with basic self-care skills, and increase level of independence with iADL prior to discharge home with care partner.  Anticipate patient will require intermittent supervision and follow up outpatient.  OT - End of Session Activity Tolerance: Tolerates 30+ min activity with multiple rests Endurance Deficit: Yes Endurance Deficit Description: seated brief rest break b/w functional mobility tasks OT Assessment Rehab Potential (ACUTE ONLY): Good OT Barriers to Discharge: Decreased caregiver support;Home environment access/layout;Inaccessible home environment;Incontinence OT Patient demonstrates impairments in the following area(s): Balance;Cognition;Endurance;Motor OT Basic ADL's Functional Problem(s): Eating;Grooming;Bathing;Dressing;Toileting OT Advanced ADL's Functional Problem(s): Simple Meal Preparation;Light Housekeeping OT Transfers Functional Problem(s): Toilet;Tub/Shower OT Additional Impairment(s): Fuctional Use of Upper Extremity OT Plan OT Intensity: Minimum of 1-2 x/day, 45 to 90 minutes OT Frequency: 5 out of 7 days OT Duration/Estimated Length of Stay: 1.5-2 weeks OT Treatment/Interventions: Balance/vestibular training;Disease mangement/prevention;Neuromuscular re-education;Self Care/advanced ADL retraining;Therapeutic Exercise;Wheelchair propulsion/positioning;UE/LE Strength taining/ROM;Cognitive remediation/compensation;DME/adaptive equipment instruction;Community reintegration;Patient/family education;UE/LE Coordination activities;Therapeutic Activities;Psychosocial support;Functional mobility training;Discharge planning OT Self Feeding Anticipated Outcome(s): mod I OT Basic Self-Care Anticipated Outcome(s): mod I OT Toileting Anticipated Outcome(s): mod I OT Bathroom Transfers Anticipated Outcome(s): mod I toilet, S shower OT Recommendation Patient destination: Home Follow Up Recommendations: Outpatient OT Equipment Recommended: To be  determined   OT Evaluation Precautions/Restrictions  Precautions Precautions: Fall Precaution Comments: monitor O2, HR, mild R hemi Restrictions Weight Bearing Restrictions: No General Chart Reviewed: Yes Response to Previous Treatment: Not applicable Family/Caregiver Present: No  Pain Pain Assessment Pain Scale: Faces Pain Score: 0-No pain Home Living/Prior Functioning Home Living Family/patient expects to be discharged to:: Private residence Living Arrangements: Spouse/significant other Available Help at Discharge: Family, Available 24 hours/day (spouse available for distant supervision only. 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 Leisure and Hobbies: yard work Prior Function Level of Independence: Independent with gait, Independent with basic ADLs, Independent with transfers  Able to Take Stairs?: Yes Driving: Yes Vocation: Retired Surveyor, mining Baseline Vision/History: 1 Wears glasses (readers) Ability to See in Adequate Light: 0 Adequate Patient Visual Report: No change from baseline Vision Assessment?: No apparent visual deficits Perception  Perception: Within Functional Limits Praxis Praxis: Intact Cognition Cognition Overall Cognitive Status: Impaired/Different from baseline Arousal/Alertness: Awake/alert Orientation Level: Person;Situation;Place Person:  Oriented Place: Oriented Situation: Oriented Memory: Impaired Memory Impairment: Retrieval deficit;Decreased recall of new information Attention: Selective Selective Attention Impairment: Verbal complex;Functional complex Awareness: Impaired Awareness Impairment: Anticipatory impairment;Emergent impairment Problem Solving: Impaired Problem Solving Impairment: Functional complex;Verbal complex Safety/Judgment: Appears intact Brief Interview for Mental Status (BIMS) Repetition of Three Words (First Attempt): 3 Temporal Orientation: Year: Correct Temporal Orientation: Month: Accurate within 5 days Temporal Orientation: Day: Correct Recall: "Sock": No, could not recall Recall: "Blue": Yes, no cue required Recall: "Bed": No, could not recall BIMS Summary Score: 11 Sensation Sensation Light Touch: Appears Intact Hot/Cold: Appears Intact Proprioception: Appears Intact Stereognosis: Appears Statistician  Movements are Fluid and Coordinated: No Fine Motor Movements are Fluid and Coordinated: No Coordination and Movement Description: Impacted by generalized deconditioning and mild R hemi Finger Nose Finger Test: midly ataxic/slower on R Heel Shin Test: [name] 9 Hole Peg Test: R: 53 seconds, L: 42 seconds Motor  Motor Motor: Hemiplegia Motor - Skilled Clinical Observations: mild R hemi  Trunk/Postural Assessment  Cervical Assessment Cervical Assessment: Exceptions to [facility] (forward head) Thoracic Assessment Thoracic Assessment: Within Functional Limits Lumbar Assessment Lumbar Assessment: Exceptions to [facility] (posterior pelvic tilt) Postural Control Postural Control: Deficits on evaluation Righting Reactions: delay  Balance Balance Balance Assessed: Yes Standardized Balance Assessment Standardized Balance Assessment: Berg Balance Test Berg Balance Test Sit to Stand: Able to stand  independently using hands Standing Unsupported: Able to stand 2 minutes with  supervision Sitting with Back Unsupported but Feet Supported on Floor or Stool: Able to sit safely and securely 2 minutes Stand to Sit: Controls descent by using hands Transfers: Needs one person to assist Standing Unsupported with Eyes Closed: Able to stand 10 seconds with supervision Standing Ubsupported with Feet Together: Able to place feet together independently and stand for 1 minute with supervision From Standing, Reach Forward with Outstretched Arm: Reaches forward but needs supervision From Standing Position, Pick up Object from Floor: Able to pick up shoe, needs supervision From Standing Position, Turn to Look Behind Over each Shoulder: Turn sideways only but maintains balance Turn 360 Degrees: Needs close supervision or verbal cueing Standing Unsupported, Alternately Place Feet on Step/Stool: Able to complete >2 steps/needs minimal assist Standing Unsupported, One Foot in Front: Needs help to step but can hold 15 seconds Standing on One Leg: Unable to try or needs assist to prevent fall Total Score: 29 Static Sitting Balance Static Sitting - Balance Support: Feet supported Static Sitting - Level of Assistance: 5: Stand by assistance Dynamic Sitting Balance Dynamic Sitting - Balance Support: Feet supported Dynamic Sitting - Level of Assistance: 4: Min assist;5: Stand by assistance Static Standing Balance Static Standing - Balance Support: During functional activity;Bilateral upper extremity supported Static Standing - Level of Assistance: 5: Stand by assistance Dynamic Standing Balance Dynamic Standing - Balance Support: Bilateral upper extremity supported;During functional activity Dynamic Standing - Level of Assistance: 5: Stand by assistance Extremity/Trunk Assessment RUE Assessment RUE Assessment: Exceptions to [facility] General Strength Comments: 4+/5 throughout, Grip strength 37 lbs, mildly ataxic RUE Body System: Neuro Brunstrum levels for arm and hand: Arm;Hand Brunstrum  level for arm: Stage V Relative Independence from Synergy Brunstrum level for hand: Stage VI Isolated joint movements LUE Assessment LUE Assessment: Within Functional Limits General Strength Comments: 5/5 throughout, grip strength 52.5 lbs  Care Tool Care Tool Self Care Eating   Eating Assist Level: Set up assist    Oral Care    Oral Care Assist Level: Contact Guard/Toucning assist    Bathing   Body parts bathed by patient: Face;Right arm;Left arm;Chest;Abdomen;Front perineal area;Buttocks;Right upper leg;Left upper leg;Right lower leg;Left lower leg     Assist Level: Contact Guard/Touching assist    Upper Body Dressing(including orthotics)   What is the patient wearing?: Pull over shirt   Assist Level: Supervision/Verbal cueing    Lower Body Dressing (excluding footwear)   What is the patient wearing?: Pants;Incontinence brief Assist for lower body dressing: Moderate Assistance - Patient 50 - 74%    Putting on/Taking off footwear   What is the patient wearing?: Non-skid slipper socks Assist for footwear: Total Assistance - Patient < 25%  Care Tool Toileting Toileting activity   Assist for toileting: Minimal Assistance - Patient > 75%     Care Tool Bed Mobility Roll left and right activity   Roll left and right assist level: Contact Guard/Touching assist    Sit to lying activity   Sit to lying assist level: Contact Guard/Touching assist    Lying to sitting on side of bed activity   Lying to sitting on side of bed assist level: the ability to move from lying on the back to sitting on the side of the bed with no back support.: Contact Guard/Touching assist     Care Tool Transfers Sit to stand transfer   Sit to stand assist level: Contact Guard/Touching assist    Chair/bed transfer   Chair/bed transfer assist level: Minimal Assistance - Patient > 75%     Toilet transfer   Assist Level: Contact Guard/Touching assist     Care Tool Cognition  Expression  of Ideas and Wants Expression of Ideas and Wants: 4. Without difficulty (complex and basic) - expresses complex messages without difficulty and with speech that is clear and easy to understand  Understanding Verbal and Non-Verbal Content Understanding Verbal and Non-Verbal Content: 4. Understands (complex and basic) - clear comprehension without cues or repetitions   Memory/Recall Ability Memory/Recall Ability : Current season;That he or she is in a hospital/hospital unit   Refer to Care Plan for [place] 1 OT Short Term Goal 1 (Week 1): Pt will complete shower transfer with CGA and LRAD. OT Short Term Goal 2 (Week 1): Pt will complete LBD with S and AE PRN. OT Short Term Goal 3 (Week 1): Pt will complete bathing at a S level and AE PRN.  Recommendations for other services: None    Skilled Therapeutic Intervention ADL ADL Eating: Set up Where Assessed-Eating: Bed level Upper Body Bathing: Supervision/safety Where Assessed-Upper Body Bathing: Sitting at sink Lower Body Bathing: Contact guard Where Assessed-Lower Body Bathing: Standing at sink Upper Body Dressing: Supervision/safety Where Assessed-Upper Body Dressing: Sitting at sink Lower Body Dressing: Minimal assistance Where Assessed-Lower Body Dressing: Sitting at sink Toileting: Minimal assistance Where Assessed-Toileting: Toilet;Bedside Commode Toilet Transfer: Therapist, music Method: Counselling psychologist: Energy manager: Not assessed Social research officer, government: Not assessed Mobility  Bed Mobility Bed Mobility: Supine to Sit Supine to Sit: Contact Guard/Touching assist Sit to Supine: Contact Guard/Touching assist Transfers Sit to Stand: Contact Guard/Touching assist Stand to Sit: Contact Guard/Touching assist Session Note : Pt received semi-reclined in bed, no c/o pain but reports feeling "worse" today than yesterday, agreeable to OT eval.  Reviewed  role of CIR OT, evaluation process, ADL/func mobility retraining, goals for therapy, and safety plan. Evaluation completed as documented above. Came to sitting EOB with close S and use of bed rail. Close S for EOB sitting balance. Stand-pivot with CGA from elevated bed with RW.   Completed sink-side bathing with CGA at sink. MD in/out morning rounds. Donned shirt and completed seated grooming with distant S. Donned pants with min A to thread BLE. Total A to swap out brief due to brief being saturated with urine. Ambulated > BSC/toilet with CGA and RW. Did not have to void additionally at this time.  Seated, Administered the Nine Hole Peg Test (NHPT) + assessed B grip strength  with the following results: -R: 53 seconds, average of 37 lbs -L: 42 seconds, average of  52.5 lbs( 37-45 seconds is considered below  average ; greater than 45 seconds is considered poor) thus demonstrating deficits in fine motor skills, hand dexterity, and speed.  SatO2 at 90-91% on RA while seated, HR at 38 - 53 bpm while seated. LPN notified.   Ambulated > toilet CGA with RW, min A to pull down pants. Incontinent/continent void of bladder. Left in care of NT.   Discharge Criteria: Patient will be discharged from OT if patient refuses treatment 3 consecutive times without medical reason, if treatment goals not met, if there is a change in medical status, if patient makes no progress towards goals or if patient is discharged from hospital.  The above assessment, treatment plan, treatment alternatives and goals were discussed and mutually agreed upon: by patient  Volanda Napoleon MS, OTR/L  09/23/2021, 12:08 PM

## 2021-09-23 NOTE — Evaluation (Signed)
Speech Language Pathology Assessment and Plan  Patient Details  Name: Eugene Campbell MRN: [id number] Date of Birth: [birth year]  SLP Diagnosis: Dysphagia;Cognitive Impairments  Rehab Potential: Good ELOS: 1.5 to 2 weeks    Today's Date: 09/23/2021 SLP Individual Time: 0973-5329 SLP Individual Time Calculation (min): 67 min   Hospital Problem: Principal Problem:   Acute ischemic left MCA stroke ([place]) Active Problems:   Aspiration pneumonia ([place])   Oropharyngeal dysphagia   Chronic systolic heart failure ([name])   Stage 3 chronic kidney disease ([place])   Drug-induced constipation  Past Medical History:  Past Medical History:  Diagnosis Date   Aortic stenosis    Chronic renal impairment, stage 3 (moderate), unspecified whether stage 3a or 3b CKD ([name])    Diabetes ([place])    HFrEF (heart failure with reduced ejection fraction) ([name])    Hypothyroidism    PE (pulmonary thromboembolism) ([place]) 2022   Past Surgical History:  Past Surgical History:  Procedure Laterality Date   AORTIC VALVE REPLACEMENT     CHOLECYSTECTOMY     IR CT HEAD LTD  09/15/2021   IR PERCUTANEOUS ART THROMBECTOMY/INFUSION INTRACRANIAL INC DIAG ANGIO  09/15/2021   IR US GUIDE VASC ACCESS RIGHT  09/15/2021   RADIOLOGY WITH ANESTHESIA N/A 09/15/2021   Procedure: IR WITH ANESTHESIA;  Surgeon: Radiologist, Medication, MD;  Location: [facility];  Service: Radiology;  Laterality: N/A;    Assessment / Plan / Recommendation Clinical Impression HPI: Patient is a 81 y.o. year old male who presented to the emergency department who developed sudden onset of right arm and leg weakness with right-sided facial droop and aphasia on 09/15/2021.  His wife alerted EMS.  He is maintained on Eliquis secondary to history of PE, chronic DVT and chronic A-fib.  He was therefore not a candidate for TNK.  CT head and CT angio head performed with evidence of hyperdense left M1 MCA segment highly suspicious for the presence of endoluminal thrombus.  Dr.  Theda Sers, neurology was consulted and interventional neuroradiology consulted regarding mechanical thrombectomy.  He underwent diagnostic cerebral angiogram on 6/14 with mechanical thrombectomy with complete recanalization of the MCA.  He was intubated for the procedure and vomited during pressure support and developed aspiration pneumonitis.  Developed acute respiratory failure with hypoxemia.  Required Levophed and heparin IV.  2D echo performed revealed ejection fraction estimated 20 to 25%.  His hemoglobin A1c is 5.9.  Given heparin subcutaneously for VTE prophylaxis.  Initially given aspirin 325 mg daily, then transitioned from IV heparin to Eliquis.  Remained on Rocephin for aspiration pneumonia.  He developed an ileus and was kept on bowel rest with orogastric tube to suction.  MRI of the brain performed 6/15 revealed patchy acute infarct in the left basal ganglia and left medial temporal lobe, recanalized left MCA M1 segment and patent left MCA bifurcation.  He improved over the next several days and was off vasopressors and following commands. He was subsequently extubated on 6/17.  He was transferred out of the ICU on 6/19. He reports his right arm and leg continue to be a little weaker than the left.  He is on DYS 3/thin diet. The patient requires inpatient medicine and rehabilitation evaluations and services for ongoing dysfunction secondary to left MCA infarcts..  Patient transferred to CIR on 09/22/2021.  Clinical swallow, motor speech, and cognitive-linguistic evaluation completed s/p acute L MCA CVA. Re: deglutition, pt presents with mildly decreased bolus cohesion and delayed oral transit in the setting of R orofacial weakness Nevertheless,  functional self-feeding and mastication of mechanical soft and regular solid textures appreciated with extended time and dentures in. Mild oral residuals noted post-prandially, which cleared with cued liquid rinse. Pt self-administered single and sequential sips  of thin liquid via cup with no evidence concerning for pharyngeal dysphagia. Pt reports "indigestion" at baseline, and that he does not eat meat. Required overall Min A to implement general aspiration precautions (slow rate, small + single bites/sips). Pt and family reports coughing with thin liquid when given via straw; therefore, recommend no straws at this time. Speech was fluent and free of word finding deficits. Intelligibility was functional at the sentence and conversational level despite lower vocal intensity. Nevertheless, pt did exhibit difficulty with confrontational naming of common objects in room; further assessment recommended. Re: cognitive-linguistic skills, pt presents with at least mild cognitive impairment in the domains of higher-level attention (dual tasking), delayed recall, and verbal problem-solving.   Given assessment findings, pt would likely benefit from skilled ST intervention targeting aforementioned deficits in an effort to maximize pt independence and decrease caregiver burden. Recommend continuation of Dysphagia 3 textures with thin liqiuds (no straws) and full supervision. Will plan to observe pt with trial tray of regular textures next week. Results and recommendations were conveyed with pt and family who verbalized agreement to proposed ST POC. Please see below for additional details.    Skilled Therapeutic Interventions          CSE, OME, informal speech and language assessment, and portions of the COGNISTAT administered.   SLP Assessment  Patient will need skilled Speech Lanaguage Pathology Services during CIR admission    Recommendations  SLP Diet Recommendations: Dysphagia 3 (Mech soft);Thin Liquid Administration via: No straw;Cup Medication Administration: Crushed with puree Supervision: Patient able to self feed;Full supervision/cueing for compensatory strategies Compensations: Minimize environmental distractions;Slow rate;Small sips/bites;Follow solids with  liquid Postural Changes and/or Swallow Maneuvers: Seated upright 90 degrees Oral Care Recommendations: Oral care BID Patient destination: Home Follow up Recommendations: 24 hour supervision/assistance;Outpatient SLP;Home Health SLP Equipment Recommended: None recommended by SLP    SLP Frequency 3 to 5 out of 7 days   SLP Duration  SLP Intensity  SLP Treatment/Interventions 1.5 to 2 weeks  Minumum of 1-2 x/day, 30 to 90 minutes  Cueing hierarchy;Dysphagia/aspiration precaution training;Functional tasks;Internal/external aids;Patient/family education    Pain Pain Assessment Pain Scale: 0-10 Pain Score: 0-No pain  Prior Functioning Cognitive/Linguistic Baseline: Within functional limits Type of Home: Mobile home  Lives With: Spouse Available Help at Discharge: Family;Available 24 hours/day Vocation: Retired  Programmer, systems Overall Cognitive Status: Impaired/Different from baseline Arousal/Alertness: Awake/alert Orientation Level: Oriented X4 Year: 2023 Month: June Day of Week: Incorrect Attention: Selective;Sustained Sustained Attention: Appears intact Selective Attention: Impaired Selective Attention Impairment: Verbal complex;Functional complex;Functional basic Memory: Impaired Memory Impairment: Retrieval deficit;Decreased recall of new information Awareness: Impaired Awareness Impairment: Emergent impairment;Anticipatory impairment Problem Solving: Impaired Problem Solving Impairment: Functional complex;Verbal complex;Verbal basic Safety/Judgment: Impaired  Comprehension Auditory Comprehension Overall Auditory Comprehension: Appears within functional limits for tasks assessed Expression Expression Primary Mode of Expression: Verbal Verbal Expression Overall Verbal Expression: Impaired Initiation: No impairment Level of Generative/Spontaneous Verbalization: Sentence Repetition: No impairment Naming: Impairment Responsive: Not  tested Confrontation: Impaired (50% accuracy with common objects in room) Divergent: Not tested Verbal Errors: Semantic paraphasias Pragmatics: No impairment Effective Techniques: Semantic cues Written Expression Dominant Hand: Right Written Expression: Not tested Oral Motor Oral Motor/Sensory Function Overall Oral Motor/Sensory Function: Mild impairment Facial ROM: Reduced right Facial Symmetry: Abnormal symmetry right Facial Strength:  Reduced right Facial Sensation: Reduced right Lingual ROM: Within Functional Limits Lingual Symmetry: Within Functional Limits Lingual Strength: Reduced Lingual Sensation: Within Functional Limits Velum: Within Functional Limits Mandible: Within Functional Limits Motor Speech Overall Motor Speech: Appears within functional limits for tasks assessed Motor Speech Errors: Not applicable  Care Tool Care Tool Cognition Ability to hear (with hearing aid or hearing appliances if normally used Ability to hear (with hearing aid or hearing appliances if normally used): 0. Adequate - no difficulty in normal conservation, social interaction, listening to TV   Expression of Ideas and Wants Expression of Ideas and Wants: 3. Some difficulty - exhibits some difficulty with expressing needs and ideas (e.g, some words or finishing thoughts) or speech is not clear   Understanding Verbal and Non-Verbal Content Understanding Verbal and Non-Verbal Content: 4. Understands (complex and basic) - clear comprehension without cues or repetitions  Memory/Recall Ability Memory/Recall Ability : Current season;That he or she is in a hospital/hospital unit    Bedside Swallowing Assessment General Date of Onset: 09/15/21 Previous Swallow Assessment: 09/19/2021 - CSE Diet Prior to this Study: Dysphagia 3 (soft);Thin liquids Temperature Spikes Noted: No Respiratory Status: Room air History of Recent Intubation: Yes Length of Intubations (days): 3 days Date extubated:  09/18/21 Behavior/Cognition: Alert;Cooperative;Pleasant mood Oral Cavity - Dentition: Dentures, top;Dentures, bottom Self-Feeding Abilities: Able to feed self;Needs set up Patient Positioning: Upright in bed Baseline Vocal Quality: Normal Volitional Cough: Congested Volitional Swallow: Able to elicit  Oral Care Assessment   Ice Chips Ice chips: Not tested Thin Liquid Thin Liquid: Within functional limits Presentation: Cup Nectar Thick Nectar Thick Liquid: Not tested Honey Thick Honey Thick Liquid: Not tested Puree Puree: Within functional limits Solid Solid: Within functional limits BSE Assessment Suspected Esophageal Findings Suspected Esophageal Findings:  (N/A) Risk for Aspiration Impact on safety and function: Mild aspiration risk Other Related Risk Factors: Previous CVA;Cognitive impairment  Short Term Goals: Week 1: SLP Short Term Goal 1 (Week 1): Pt will consume regular textures and thin liquids via cup with no clinical s/sx concerning for aspiration given Sup A for implementation of aspiration precautions. SLP Short Term Goal 2 (Week 1): Pt will ID 2 cognitive deficits and 2 physical deficits s/p CVA with Sup A. SLP Short Term Goal 3 (Week 1): Pt will recall 3 pieces of functional information re: daily events, therapy and/or MD recs with Sup A to write in and reference external memory aids to include memory notebook. SLP Short Term Goal 4 (Week 1): Pt will complete functional problem-solving tasks pertaining to iADL completion with 100% accuracy Sup A.  Refer to Care Plan for Long Term Goals  Recommendations for other services: None   Discharge Criteria: Patient will be discharged from SLP if patient refuses treatment 3 consecutive times without medical reason, if treatment goals not met, if there is a change in medical status, if patient makes no progress towards goals or if patient is discharged from hospital.  The above assessment, treatment plan, treatment  alternatives and goals were discussed and mutually agreed upon: by patient and by family  Vinnie Langton 09/23/2021, 3:44 PM

## 2021-09-23 NOTE — Progress Notes (Signed)
Inpatient Rehabilitation Care Coordinator Assessment and Plan Patient Details  Name: Eugene Campbell MRN: [id number] Date of Birth: 1940/06/30  Today's Date: 09/23/2021  Hospital Problems: Principal Problem:   Acute ischemic left MCA stroke [facility]) Active Problems:   Aspiration pneumonia ([place])   Oropharyngeal dysphagia   Chronic systolic heart failure ([place])   Stage 3 chronic kidney disease ([place])   Drug-induced constipation  Past Medical History:  Past Medical History:  Diagnosis Date   Aortic stenosis    Chronic renal impairment, stage 3 (moderate), unspecified whether stage 3a or 3b CKD ([name])    Diabetes ([place])    HFrEF (heart failure with reduced ejection fraction) ([name])    Hypothyroidism    PE (pulmonary thromboembolism) ([place]) 2022   Past Surgical History:  Past Surgical History:  Procedure Laterality Date   AORTIC VALVE REPLACEMENT     CHOLECYSTECTOMY     IR CT HEAD LTD  09/15/2021   IR PERCUTANEOUS ART THROMBECTOMY/INFUSION INTRACRANIAL INC DIAG ANGIO  09/15/2021   IR US GUIDE VASC ACCESS RIGHT  09/15/2021   RADIOLOGY WITH ANESTHESIA N/A 09/15/2021   Procedure: IR WITH ANESTHESIA;  Surgeon: Radiologist, Medication, MD;  Location: [facility];  Service: Radiology;  Laterality: N/A;   Social History:  reports that he quit smoking about 50 years ago. His smoking use included cigarettes. He has a 13.00 pack-year smoking history. He does not have any smokeless tobacco history on file. He reports that he does not drink alcohol. No history on file for drug use.  Family / Support Systems Spouse/Significant Other: Murray Hodgkins Children: Angie (Daughter) Anticipated Caregiver: Daughter, [name] able to Assist PRN. Step Daughter able to assist PRN Ability/Limitations of Caregiver: Spouse is 68 and HOH. Caregiver Availability: Intermittent Family Dynamics: support from spouse, daughter ans step daughter  Social History Preferred language: English Religion:  Health Literacy - How often do you need  to have someone help you when you read instructions, pamphlets, or other written material from your doctor or pharmacy?: Never Writes: Yes   Abuse/Neglect Abuse/Neglect Assessment Can Be Completed: Yes Physical Abuse: Denies Verbal Abuse: Denies Sexual Abuse: Denies Exploitation of patient/patient's resources: Denies Self-Neglect: Denies  Patient response to: Social Isolation - How often do you feel lonely or isolated from those around you?: Never  Emotional Status Recent Psychosocial Issues: coping Psychiatric History: n/a Substance Abuse History: n/a  Patient / Family Perceptions, Expectations & Goals Pt/Family understanding of illness & functional limitations: yes Premorbid pt/family roles/activities: Independent and driving Anticipated changes in roles/activities/participation: Daughters able to assist PRN Pt/family expectations/goals: MOD I to [place]: None Premorbid Home Care/DME Agencies: Other (Comment) Agricultural consultant, SPC, Civil engineer, contracting, Wheelchair) Transportation available at discharge: daughters able to transport Is the patient able to respond to transportation needs?: Yes In the past 12 months, has lack of transportation kept you from medical appointments or from getting medications?: No In the past 12 months, has lack of transportation kept you from meetings, work, or from getting things needed for daily living?: No  Discharge Planning Living Arrangements: Spouse/significant other Support Systems: Spouse/significant other, Children Type of Residence: Private residence [facility] (5 steps in the front, Ramp at back entrance)) Insurance Resources: Multimedia programmer (specify) ([place]) Financial Resources: Family Support Financial Screen Referred: No Living Expenses: Own Money Management: Patient Does the patient have any problems obtaining your medications?: No Home Management: Independent Patient/Family  Preliminary Plans: Daughters able to assist Care Coordinator Barriers to Discharge: Insurance for SNF coverage, Lack of/limited family  support, Decreased caregiver support Care Coordinator Anticipated Follow Up Needs: HH/OP Expected length of stay: 10-12 Days  Clinical Impression Sw met with patient introduced self to patient and spouse and provided contact information. Spouse very pleased with patient care and thankful. Patient spouse is HOH. Patient daughter will be present Wednesday to be provided with team conference updates. No additional questions or concerns, sw will continue to follow up.  Dyanne Iha 09/23/2021, 12:32 PM

## 2021-09-23 NOTE — Progress Notes (Signed)
Inpatient Rehabilitation  Patient information reviewed and entered into eRehab system by Lucetta Baehr Emilly Lavey, OTR/L.   Information including medical coding, functional ability and quality indicators will be reviewed and updated through discharge.    

## 2021-09-23 NOTE — Evaluation (Signed)
Physical Therapy Assessment and Plan  Patient Details  Name: Eugene Campbell MRN: [id number] Date of Birth: 05-Oct-1940  PT Diagnosis: Abnormal posture, Abnormality of gait, Cognitive deficits, Difficulty walking, Hemiparesis dominant, and Muscle weakness Rehab Potential: Good ELOS: 10-14 days   Today's Date: 09/23/2021 PT Individual Time: 1100-1159 PT Individual Time Calculation (min): 31 min    Hospital Problem: Principal Problem:   Acute ischemic left MCA stroke ([name]) Active Problems:   Aspiration pneumonia ([name])   Oropharyngeal dysphagia   Chronic systolic heart failure ([name])   Stage 3 chronic kidney disease ([name])   Drug-induced constipation   Past Medical History:  Past Medical History:  Diagnosis Date   Aortic stenosis    Chronic renal impairment, stage 3 (moderate), unspecified whether stage 3a or 3b CKD ([name])    Diabetes ([place])    HFrEF (heart failure with reduced ejection fraction) ([name])    Hypothyroidism    PE (pulmonary thromboembolism) ([name]) 2022   Past Surgical History:  Past Surgical History:  Procedure Laterality Date   AORTIC VALVE REPLACEMENT     CHOLECYSTECTOMY     IR CT HEAD LTD  09/15/2021   IR PERCUTANEOUS ART THROMBECTOMY/INFUSION INTRACRANIAL INC DIAG ANGIO  09/15/2021   IR US GUIDE VASC ACCESS RIGHT  09/15/2021   RADIOLOGY WITH ANESTHESIA N/A 09/15/2021   Procedure: IR WITH ANESTHESIA;  Surgeon: Radiologist, Medication, MD;  Location: [facility];  Service: Radiology;  Laterality: N/A;    Assessment & Plan Clinical Impression: Patient an 81 year old male who presented to the emergency department who developed sudden onset of right arm and leg weakness with right-sided facial droop and aphasia on 09/15/2021.  His wife alerted EMS.  He is maintained on Eliquis secondary to history of PE, chronic DVT and chronic A-fib.  He was therefore not a candidate for TNK.  CT head and CT angio head performed with evidence of hyperdense left M1 MCA segment highly suspicious  for the presence of endoluminal thrombus.  Dr. Theda Sers, neurology was consulted and interventional neuroradiology consulted regarding mechanical thrombectomy.  He underwent diagnostic cerebral angiogram on 6/14 with mechanical thrombectomy with complete recanalization of the MCA.  He was intubated for the procedure and vomited during pressure support and developed aspiration pneumonitis.  Developed acute respiratory failure with hypoxemia.  Required Levophed and heparin IV.  2D echo performed revealed ejection fraction estimated 20 to 25%.  His hemoglobin A1c is 5.9.  Given heparin subcutaneously for VTE prophylaxis.  Initially given aspirin 325 mg daily, then transitioned from IV heparin to Eliquis.  Remained on Rocephin for aspiration pneumonia.  He developed an ileus and was kept on bowel rest with orogastric tube to suction.  MRI of the brain performed 6/15 revealed patchy acute infarct in the left basal ganglia and left medial temporal lobe, recanalized left MCA M1 segment and patent left MCA bifurcation.  He improved over the next several days and was off vasopressors and following commands. He was subsequently extubated on 6/17.  He was transferred out of the ICU on 6/19. He reports his right arm and leg continue to be a little weaker than the left.  He is on DYS 3/thin diet. The patient requires inpatient medicine and rehabilitation evaluations and services for ongoing dysfunction secondary to left MCA infarcts. Patient transferred to CIR on 09/22/2021 .   Patient currently requires min with mobility secondary to muscle weakness, decreased cardiorespiratoy endurance, decreased problem solving, decreased safety awareness, and decreased memory, and decreased standing balance, decreased postural control, hemiplegia, and  decreased balance strategies.  Prior to hospitalization, patient was independent  with mobility and lived with Spouse (spouse is 81 y/o) in a Mobile home home.  Home access is ramped entrance  in backStairs to enter, Ramped entrance.  Patient will benefit from skilled PT intervention to maximize safe functional mobility, minimize fall risk, and decrease caregiver burden for planned discharge home with 24 hour supervision.  Anticipate patient will benefit from follow up [facility] at discharge.  PT - End of Session Activity Tolerance: Tolerates < 10 min activity with changes in vital signs Endurance Deficit: Yes Endurance Deficit Description: seated brief rest break b/w functional mobility tasks PT Assessment Rehab Potential (ACUTE/IP ONLY): Good PT Barriers to Discharge: Lack of/limited family support;Decreased caregiver support;Insurance for SNF coverage PT Patient demonstrates impairments in the following area(s): Balance;Motor;Endurance;Safety PT Transfers Functional Problem(s): Bed Mobility;Bed to Chair;Car PT Locomotion Functional Problem(s): Ambulation;Stairs PT Plan PT Intensity: Minimum of 1-2 x/day ,45 to 90 minutes PT Frequency: 5 out of 7 days PT Duration Estimated Length of Stay: 10-14 days PT Treatment/Interventions: Ambulation/gait training;Discharge planning;Functional mobility training;Psychosocial support;Therapeutic Activities;Visual/perceptual remediation/compensation;Therapeutic Exercise;Skin care/wound management;Neuromuscular re-education;Disease management/prevention;Balance/vestibular training;Cognitive remediation/compensation;DME/adaptive equipment instruction;Pain management;Splinting/orthotics;UE/LE Strength taining/ROM;Wheelchair propulsion/positioning;Stair training;Patient/family education;UE/LE Coordination activities;Community reintegration;Functional electrical stimulation PT Transfers Anticipated Outcome(s): mod I with LRAD PT Locomotion Anticipated Outcome(s): supervision with LRAD PT Recommendation Follow Up Recommendations: Home health PT;24 hour supervision/assistance Patient destination: Home Equipment Recommended: To be determined   PT  Evaluation Precautions/Restrictions Precautions Precautions: Fall Precaution Comments: monitor O2, HR, mild R hemi Restrictions Weight Bearing Restrictions: No Pain Pain Assessment Pain Scale: Faces Pain Score: 0-No pain Pain Interference Pain Interference Pain Effect on Sleep: 0. Does not apply - I have not had any pain or hurting in the past 5 days Pain Interference with Therapy Activities: 0. Does not apply - I have not received rehabilitationtherapy in the past 5 days Pain Interference with Day-to-Day Activities: 1. Rarely or not at all Home Living/Prior [place]: Spouse/significant other Available Help at Discharge: Family;Available 24 hours/day (spouse available for distant supervision only. Daughter can provide PRN A) Type of Home: Mobile home Home Access: Stairs to enter;Ramped entrance Entrance Stairs-Number of Steps: ramped entrance in back Home Layout: One level Bathroom Shower/Tub: Government social research officer Accessibility: Yes Additional Comments: DME belongs to pt's wife  Lives With: Spouse (spouse is 34 y/o) Prior Function Level of Independence: Independent with gait;Independent with basic ADLs;Independent with transfers  Able to Take Stairs?: Yes Driving: Yes Vocation: Retired Vision/Perception  Vision - History Ability to See in Adequate Light: 0 Adequate Perception Perception: Within Functional Limits Praxis Praxis: Intact  Cognition Overall Cognitive Status: Impaired/Different from baseline Arousal/Alertness: Awake/alert Orientation Level: Oriented X4 Attention: Selective Selective Attention Impairment: Verbal complex;Functional complex Memory: Impaired Memory Impairment: Retrieval deficit;Decreased recall of new information Awareness: Impaired Awareness Impairment: Anticipatory impairment;Emergent impairment Problem Solving: Impaired Problem Solving Impairment: Functional complex;Verbal  complex Safety/Judgment: Appears intact Sensation Sensation Light Touch: Appears Intact Hot/Cold: Appears Intact Proprioception: Appears Intact Stereognosis: Appears Intact Coordination Gross Motor Movements are Fluid and Coordinated: No Fine Motor Movements are Fluid and Coordinated: No Coordination and Movement Description: Impacted by generalized deconditioning and mild R hemi Finger Nose Finger Test: midly ataxic/slower on R Heel Shin Test: [facility] Hole Peg Test: R: 53 seconds, L: 42 seconds Motor  Motor Motor: Hemiplegia Motor - Skilled Clinical Observations: mild R hemi   Trunk/Postural Assessment  Cervical Assessment Cervical Assessment: Exceptions to [facility] (forward head) Thoracic Assessment Thoracic Assessment: Within  Functional Limits Lumbar Assessment Lumbar Assessment: Exceptions to [facility] (posterior pelvic tilt) Postural Control Postural Control: Deficits on evaluation Righting Reactions: delay  Balance Balance Balance Assessed: Yes Standardized Balance Assessment Standardized Balance Assessment: Berg Balance Test Berg Balance Test Sit to Stand: Able to stand  independently using hands Standing Unsupported: Able to stand 2 minutes with supervision Sitting with Back Unsupported but Feet Supported on Floor or Stool: Able to sit safely and securely 2 minutes Stand to Sit: Controls descent by using hands Transfers: Needs one person to assist Standing Unsupported with Eyes Closed: Able to stand 10 seconds with supervision Standing Ubsupported with Feet Together: Able to place feet together independently and stand for 1 minute with supervision From Standing, Reach Forward with Outstretched Arm: Reaches forward but needs supervision From Standing Position, Pick up Object from Floor: Able to pick up shoe, needs supervision From Standing Position, Turn to Look Behind Over each Shoulder: Turn sideways only but maintains balance Turn 360 Degrees: Needs close supervision or  verbal cueing Standing Unsupported, Alternately Place Feet on Step/Stool: Able to complete >2 steps/needs minimal assist Standing Unsupported, One Foot in Front: Needs help to step but can hold 15 seconds Standing on One Leg: Unable to try or needs assist to prevent fall Total Score: 29 Static Sitting Balance Static Sitting - Balance Support: Feet supported Static Sitting - Level of Assistance: 5: Stand by assistance Dynamic Sitting Balance Dynamic Sitting - Balance Support: Feet supported Dynamic Sitting - Level of Assistance: 4: Min assist;5: Stand by assistance Static Standing Balance Static Standing - Balance Support: During functional activity;Bilateral upper extremity supported Static Standing - Level of Assistance: 5: Stand by assistance Dynamic Standing Balance Dynamic Standing - Balance Support: Bilateral upper extremity supported;During functional activity Dynamic Standing - Level of Assistance: 5: Stand by assistance Extremity Assessment  RUE Assessment RUE Assessment: Exceptions to [facility] General Strength Comments: 4+/5 throughout, Grip strength 37 lbs, mildly ataxic RUE Body System: Neuro Brunstrum levels for arm and hand: Arm;Hand Brunstrum level for arm: Stage V Relative Independence from Synergy Brunstrum level for hand: Stage VI Isolated joint movements   RLE Assessment RLE Assessment: Exceptions to [facility] General Strength Comments: Grossly 4/5 LLE Assessment LLE Assessment: Within Functional Limits  Care Tool Care Tool Bed Mobility Roll left and right activity   Roll left and right assist level: Contact Guard/Touching assist    Sit to lying activity   Sit to lying assist level: Contact Guard/Touching assist    Lying to sitting on side of bed activity   Lying to sitting on side of bed assist level: the ability to move from lying on the back to sitting on the side of the bed with no back support.: Contact Guard/Touching assist     Care Tool Transfers Sit to  stand transfer   Sit to stand assist level: Contact Guard/Touching assist    Chair/bed transfer   Chair/bed transfer assist level: Minimal Assistance - Patient > 75%     Toilet transfer   Assist Level: Contact Guard/Touching assist    Car transfer   Car transfer assist level: Minimal Assistance - Patient > 75%      Care Tool Locomotion Ambulation   Assist level: Minimal Assistance - Patient > 75% Assistive device: No Device Max distance: 163f  Walk 10 feet activity   Assist level: Minimal Assistance - Patient > 75% Assistive device: No Device   Walk 50 feet with 2 turns activity   Assist level: Minimal Assistance - Patient >  75% Assistive device: No Device  Walk 150 feet activity Walk 150 feet activity did not occur: Safety/medical concerns (fatigue)      Walk 10 feet on uneven surfaces activity Walk 10 feet on uneven surfaces activity did not occur: Safety/medical concerns      Stairs   Assist level: Minimal Assistance - Patient > 75% Stairs assistive device: 2 hand rails Max number of stairs: 8  Walk up/down 1 step activity   Walk up/down 1 step (curb) assist level: Minimal Assistance - Patient > 75% Walk up/down 1 step or curb assistive device: 2 hand rails  Walk up/down 4 steps activity   Walk up/down 4 steps assist level: Minimal Assistance - Patient > 75% Walk up/down 4 steps assistive device: 2 hand rails  Walk up/down 12 steps activity Walk up/down 12 steps activity did not occur: Safety/medical concerns      Pick up small objects from floor   Pick up small object from the floor assist level: Contact Guard/Touching assist    Wheelchair Is the patient using a wheelchair?: No          Wheel 50 feet with 2 turns activity      Wheel 150 feet activity        Refer to Care Plan for Long Term Goals  SHORT TERM GOAL WEEK 1 PT Short Term Goal 1 (Week 1): Pt will complete bed mobility with supervision PT Short Term Goal 2 (Week 1): Pt will complete  bed<>chair transfers with CGA and LRAD PT Short Term Goal 3 (Week 1): Pt will ambulate 193f with CGA and LRAD PT Short Term Goal 4 (Week 1): Pt will improve BERG balance test by at least 7 points  Recommendations for other services: None   Skilled Therapeutic Intervention Mobility Bed Mobility Bed Mobility: Supine to Sit Supine to Sit: Contact Guard/Touching assist Sit to Supine: Contact Guard/Touching assist Transfers Transfers: Sit to Stand;Stand to Sit;Stand Pivot Transfers Sit to Stand: Contact Guard/Touching assist Stand to Sit: Contact Guard/Touching assist Stand Pivot Transfers: Contact Guard/Touching assist Stand Pivot Transfer Details: Verbal cues for sequencing;Verbal cues for technique;Verbal cues for precautions/safety;Visual cues/gestures for sequencing;Tactile cues for posture Transfer (Assistive device): Rolling walker Locomotion  Gait Ambulation: Yes Gait Assistance: Minimal Assistance - Patient > 75% Gait Distance (Feet): 100 Feet Assistive device: None Gait Assistance Details: Verbal cues for gait pattern;Verbal cues for sequencing;Verbal cues for precautions/safety;Verbal cues for technique;Tactile cues for posture;Tactile cues for weight shifting Gait Gait: Yes Gait Pattern: Impaired Gait Pattern: Step-to pattern;Decreased step length - right;Decreased step length - left;Decreased stride length;Narrow base of support;Trunk flexed;Shuffle;Right flexed knee in stance;Left flexed knee in stance Gait velocity: decreased Stairs / Additional Locomotion Stairs: Yes Stairs Assistance: Minimal Assistance - Patient > 75% Stair Management Technique: Two rails Number of Stairs: 8 Height of Stairs: 6 Wheelchair Mobility Wheelchair Mobility: No   Discharge Criteria: Patient will be discharged from PT if patient refuses treatment 3 consecutive times without medical reason, if treatment goals not met, if there is a change in medical status, if patient makes no progress  towards goals or if patient is discharged from hospital.  The above assessment, treatment plan, treatment alternatives and goals were discussed and mutually agreed upon: by patient  CFairmountPT 09/23/2021, 12:04 PM

## 2021-09-23 NOTE — Plan of Care (Signed)
Problem: RH Balance Goal: LTG Patient will maintain dynamic standing with ADLs (OT) Description: LTG:  Patient will maintain dynamic standing balance with assist during activities of daily living (OT)  Flowsheets (Taken 09/23/2021 1149) LTG: Pt will maintain dynamic standing balance during ADLs with: Independent with assistive device   Problem: Sit to Stand Goal: LTG:  Patient will perform sit to stand in prep for activites of daily living with assistance level (OT) Description: LTG:  Patient will perform sit to stand in prep for activites of daily living with assistance level (OT) Flowsheets (Taken 09/23/2021 1149) LTG: PT will perform sit to stand in prep for activites of daily living with assistance level: Independent with assistive device   Problem: RH Eating Goal: LTG Patient will perform eating w/assist, cues/equip (OT) Description: LTG: Patient will perform eating with assist, with/without cues using equipment (OT) Flowsheets (Taken 09/23/2021 1149) LTG: Pt will perform eating with assistance level of: Independent with assistive device    Problem: RH Grooming Goal: LTG Patient will perform grooming w/assist,cues/equip (OT) Description: LTG: Patient will perform grooming with assist, with/without cues using equipment (OT) Flowsheets (Taken 09/23/2021 1149) LTG: Pt will perform grooming with assistance level of: Independent with assistive device    Problem: RH Bathing Goal: LTG Patient will bathe all body parts with assist levels (OT) Description: LTG: Patient will bathe all body parts with assist levels (OT) Flowsheets (Taken 09/23/2021 1149) LTG: Pt will perform bathing with assistance level/cueing: Independent with assistive device    Problem: RH Dressing Goal: LTG Patient will perform upper body dressing (OT) Description: LTG Patient will perform upper body dressing with assist, with/without cues (OT). Flowsheets (Taken 09/23/2021 1149) LTG: Pt will perform upper body dressing  with assistance level of: Independent Goal: LTG Patient will perform lower body dressing w/assist (OT) Description: LTG: Patient will perform lower body dressing with assist, with/without cues in positioning using equipment (OT) Flowsheets (Taken 09/23/2021 1149) LTG: Pt will perform lower body dressing with assistance level of: Independent with assistive device   Problem: RH Toileting Goal: LTG Patient will perform toileting task (3/3 steps) with assistance level (OT) Description: LTG: Patient will perform toileting task (3/3 steps) with assistance level (OT)  Flowsheets (Taken 09/23/2021 1149) LTG: Pt will perform toileting task (3/3 steps) with assistance level: Independent with assistive device   Problem: RH Functional Use of Upper Extremity Goal: LTG Patient will use RT/LT upper extremity as a (OT) Description: LTG: Patient will use right/left upper extremity as a stabilizer/gross assist/diminished/nondominant/dominant level with assist, with/without cues during functional activity (OT) Flowsheets (Taken 09/23/2021 1149) LTG: Use of upper extremity in functional activities: RUE as nondominant level LTG: Pt will use upper extremity in functional activity with assistance level of: Independent with assistive device   Problem: RH Simple Meal Prep Goal: LTG Patient will perform simple meal prep w/assist (OT) Description: LTG: Patient will perform simple meal prep with assistance, with/without cues (OT). Flowsheets (Taken 09/23/2021 1149) LTG: Pt will perform simple meal prep with assistance level of: Supervision/Verbal cueing   Problem: RH Light Housekeeping Goal: LTG Patient will perform light housekeeping w/assist (OT) Description: LTG: Patient will perform light housekeeping with assistance, with/without cues (OT). Flowsheets (Taken 09/23/2021 1149) LTG: Pt will perform light housekeeping with assistance level of: Supervision/Verbal cueing   Problem: RH Toilet Transfers Goal: LTG  Patient will perform toilet transfers w/assist (OT) Description: LTG: Patient will perform toilet transfers with assist, with/without cues using equipment (OT) Flowsheets (Taken 09/23/2021 1149) LTG: Pt will perform toilet transfers  with assistance level of: Independent with assistive device   Problem: RH Tub/Shower Transfers Goal: LTG Patient will perform tub/shower transfers w/assist (OT) Description: LTG: Patient will perform tub/shower transfers with assist, with/without cues using equipment (OT) Flowsheets (Taken 09/23/2021 1149) LTG: Pt will perform tub/shower stall transfers with assistance level of: Supervision/Verbal cueing   Problem: RH Furniture Transfers Goal: LTG Patient will perform furniture transfers w/assist (OT/PT) Description: LTG: Patient will perform furniture transfers  with assistance (OT/PT). Flowsheets (Taken 09/23/2021 1149) LTG: Pt will perform furniture transfers with assist:: Independent with assistive device

## 2021-09-23 NOTE — Progress Notes (Signed)
Pt's wife states that pt's family has a history of Glomerulonephritis and does not want pt to have insulin unless it is necessary.  Dois Davenport NP made aware.

## 2021-09-23 NOTE — Progress Notes (Signed)
Informed by attendant RN that patient's family is refusing any insulin secondary to "family history of glomerulonephritis". His orders for insulin were continued from acute side. His A1c on 09/15/2021 was 5.9. CBG range in last 48 hours 114-195.   CBG (last 3)  Recent Labs    09/22/21 2228 09/23/21 0540 09/23/21 1238  GLUCAP 175* 119* 132*    Will discontinue SSI and CBGs.

## 2021-09-24 DIAGNOSIS — I63512 Cerebral infarction due to unspecified occlusion or stenosis of left middle cerebral artery: Secondary | ICD-10-CM | POA: Diagnosis not present

## 2021-09-24 MED ORDER — FUROSEMIDE 20 MG PO TABS
10.0000 mg | ORAL_TABLET | Freq: Every day | ORAL | Status: DC
  Administered 2021-09-24 – 2021-09-26 (×3): 10 mg via ORAL
  Filled 2021-09-24 (×3): qty 1

## 2021-09-24 MED ORDER — PNEUMOCOCCAL 20-VAL CONJ VACC 0.5 ML IM SUSY
0.5000 mL | PREFILLED_SYRINGE | INTRAMUSCULAR | Status: AC
  Administered 2021-09-27: 0.5 mL via INTRAMUSCULAR
  Filled 2021-09-24: qty 0.5

## 2021-09-25 DIAGNOSIS — I63512 Cerebral infarction due to unspecified occlusion or stenosis of left middle cerebral artery: Secondary | ICD-10-CM | POA: Diagnosis not present

## 2021-09-25 MED ORDER — TRAZODONE HCL 50 MG PO TABS
50.0000 mg | ORAL_TABLET | Freq: Every day | ORAL | Status: DC
  Administered 2021-09-25 – 2021-09-30 (×6): 50 mg via ORAL
  Filled 2021-09-25 (×6): qty 1

## 2021-09-25 NOTE — Progress Notes (Signed)
Speech Language Pathology Daily Session Note  Patient Details  Name: Eugene Campbell MRN: [id number] Date of Birth: 04-25-1940  Today's Date: 09/25/2021 SLP Individual Time: 1505-1600 SLP Individual Time Calculation (min): 55 min  Short Term Goals: Week 1: SLP Short Term Goal 1 (Week 1): Pt will consume regular textures and thin liquids via cup with no clinical s/sx concerning for aspiration given Sup A for implementation of aspiration precautions. SLP Short Term Goal 2 (Week 1): Pt will ID 2 cognitive deficits and 2 physical deficits s/p CVA with Sup A. SLP Short Term Goal 3 (Week 1): Pt will recall 3 pieces of functional information re: daily events, therapy and/or MD recs with Sup A to write in and reference external memory aids to include memory notebook. SLP Short Term Goal 4 (Week 1): Pt will complete functional problem-solving tasks pertaining to iADL completion with 100% accuracy Sup A.  Skilled Therapeutic Interventions:  Pt was seen for skilled ST targeting cognitive goals.  Pt was awake, alert, and agreeable to participating in treatment.  Pt was requesting to use the bathroom upon therapist's arrival and only needed x1 supervision cue to slow down due to impulsivity when ambulating to the bathroom.  Pt incontinent of bladder due to urgency but continent of bowel.  Once pt was seated in the recliner, SLP facilitated the session with skilled education regarding memory compensatory strategies and provided pt with a memory notebook.  Pt required min assist to record details from today's therapy session into notebook.  SLP also facilitated the session with a basic medication management task from the ALFA standardized cognitive assessment.  Pt needed min assist verbal cues for locating information from medication labels and increased time when organizing medications into a medication chart.  All questions were answered to pt's and family's satisfaction at this time.  Pt was transferred back to  bed per request and left with bed alarm set and call bell within reach at the end of today's session.  Continue per current plan of care.    Pain Pain Assessment Pain Scale: 0-10 Pain Score: 0-No pain  Therapy/Group: Individual Therapy  Danean Marner, Melanee Spry 09/25/2021, 6:04 PM

## 2021-09-26 DIAGNOSIS — I63512 Cerebral infarction due to unspecified occlusion or stenosis of left middle cerebral artery: Secondary | ICD-10-CM | POA: Diagnosis not present

## 2021-09-26 MED ORDER — FUROSEMIDE 20 MG PO TABS
10.0000 mg | ORAL_TABLET | Freq: Two times a day (BID) | ORAL | Status: DC
  Administered 2021-09-26 – 2021-10-01 (×10): 10 mg via ORAL
  Filled 2021-09-26 (×10): qty 1

## 2021-09-27 DIAGNOSIS — K5900 Constipation, unspecified: Secondary | ICD-10-CM

## 2021-09-27 DIAGNOSIS — I5022 Chronic systolic (congestive) heart failure: Secondary | ICD-10-CM | POA: Diagnosis not present

## 2021-09-27 DIAGNOSIS — I63512 Cerebral infarction due to unspecified occlusion or stenosis of left middle cerebral artery: Secondary | ICD-10-CM | POA: Diagnosis not present

## 2021-09-27 DIAGNOSIS — J90 Pleural effusion, not elsewhere classified: Secondary | ICD-10-CM

## 2021-09-27 DIAGNOSIS — D696 Thrombocytopenia, unspecified: Secondary | ICD-10-CM

## 2021-09-27 DIAGNOSIS — G4700 Insomnia, unspecified: Secondary | ICD-10-CM

## 2021-09-27 LAB — BASIC METABOLIC PANEL
Anion gap: 5 (ref 5–15)
BUN: 11 mg/dL (ref 8–23)
CO2: 26 mmol/L (ref 22–32)
Calcium: 8.2 mg/dL — ABNORMAL LOW (ref 8.9–10.3)
Chloride: 105 mmol/L (ref 98–111)
Creatinine, Ser: 1.18 mg/dL (ref 0.61–1.24)
GFR, Estimated: >60 mL/min (ref >60)
Glucose, Bld: 107 mg/dL — ABNORMAL HIGH (ref 70–99)
Potassium: 4.2 mmol/L (ref 3.5–5.1)
Sodium: 136 mmol/L (ref 135–145)

## 2021-09-27 LAB — CBC
HCT: 34.9 % — ABNORMAL LOW (ref 39.0–52.0)
Hemoglobin: 11.9 g/dL — ABNORMAL LOW (ref 13.0–17.0)
MCH: 32.7 pg (ref 26.0–34.0)
MCHC: 34.1 g/dL (ref 30.0–36.0)
MCV: 95.9 fL (ref 80.0–100.0)
Platelets: 201 10*3/uL (ref 150–400)
RBC: 3.64 MIL/uL — ABNORMAL LOW (ref 4.22–5.81)
RDW: 13.4 % (ref 11.5–15.5)
WBC: 9.1 10*3/uL (ref 4.0–10.5)
nRBC: 0 % (ref 0.0–0.2)

## 2021-09-27 MED ORDER — MEDIHONEY WOUND/BURN DRESSING EX PSTE
1.0000 | PASTE | Freq: Every day | CUTANEOUS | Status: DC
  Administered 2021-09-27 – 2021-10-01 (×5): 1 via TOPICAL
  Filled 2021-09-27: qty 44

## 2021-09-27 MED ORDER — COLLAGENASE 250 UNIT/GM EX OINT: TOPICAL_OINTMENT | Freq: Every day | CUTANEOUS | Status: DC

## 2021-09-27 NOTE — Discharge Summary (Signed)
Physician Discharge Summary  Patient ID: Eugene Campbell MRN: [id number] DOB/AGE: 1940-10-30 81 y.o.  Admit date: 09/22/2021 Discharge date: 10/01/2021  Discharge Diagnoses:  Principal Problem:   Acute ischemic left MCA stroke [facility]) Active Problems:   Aspiration pneumonia ([name])   Oropharyngeal dysphagia   Chronic systolic heart failure ([name])   Stage 3 chronic kidney disease ([name])   Drug-induced constipation Functional deficits secondary to acute ischemic left MCA stroke Chronic atrial fibrillation Cardiomyopathy Diabetes mellitus Acute kidney injury atop chronic kidney disease stage III Tobacco use Urinary incontinence Decubitus ulcer  Discharged Condition: good  Significant Diagnostic Studies: DG Chest 2 View  Result Date: 09/23/2021 CLINICAL DATA:  Short of breath EXAM: CHEST - 2 VIEW COMPARISON:  09/17/2021 FINDINGS: Endotracheal tube removed.  NG removed. Progressive bilateral airspace disease, slightly asymmetric to the left. No significant pleural effusion. IMPRESSION: Interval development of bilateral airspace disease. This may represent edema or pneumonia. Electronically Signed   By: Marlan Palau M.D.   On: 09/23/2021 10:36    Labs:  Basic Metabolic Panel: Recent Labs  Lab 09/27/21 0623  NA 136  K 4.2  CL 105  CO2 26  GLUCOSE 107*  BUN 11  CREATININE 1.18  CALCIUM 8.2*    CBC: Recent Labs  Lab 09/27/21 0623  WBC 9.1  HGB 11.9*  HCT 34.9*  MCV 95.9  PLT 201    CBG: No results for input(s): "GLUCAP" in the last 168 hours.   Brief HPI:   Eugene Campbell is a 81 y.o. male who presented to the emergency department after he developed sudden onset of right arm and leg weakness with right-sided facial droop and aphasia on 09/15/2021.  Neurology was consulted and interventional neuroradiology consulted regarding mechanical thrombectomy.  Complete recanalization of the MCA.  He was intubated for the procedure and vomited during pressure support and remained  on the ventilator.  Antibiotics for aspiration pneumonia.  MRI of the brain performed on 6/15 revealed patchy acute infarct in the left basal ganglia and left medial temporal lobe.  Extubated on 6/17.   Hospital Course: Eugene Campbell was admitted to rehab 09/22/2021 for inpatient therapies to consist of PT, ST and OT at least three hours five days a week. Past admission physiatrist, therapy team and rehab RN have worked together to provide customized collaborative inpatient rehab.  Dysphasia 3 diet with thin liquids recommended.  Required increased supplemental O2 use overnight on 623 and chest x-ray was obtained.  Lasix resumed at low-dose (10 mg daily) given chest x-ray results and hypoxia.  He had no fever and no respiratory distress.  Lasix was increased to twice daily dosing secondary to left basilar rales on 6/25.  Was complaining of poor sleep but not asking for trazodone, therefore scheduled at bedtime on 6/24. Thrombocytpenia resloved.WBC normal 6/26. Afrin spray for mild nosebleed secondary to [place] in setting of Eliquis.   Blood pressures were monitored on TID basis and remained controlled. Lasix 10 mg BID for left basilar rales with good results/output.  Diabetes has been monitored with ac/hs CBG checks and SSI was use prn for tighter BS control.  The patient's family reported a history of glomerulonephritis with insulin use and requested patient be taken off insulin.  His A1c on 6/14 was 5.9 and blood glucose range 110s to 190s, insulin discontinued 6/22.   Rehab course: During patient's stay in rehab weekly team conferences were held to monitor patient's progress, set goals and discuss barriers to discharge. At admission, patient required min assist-contact-guard  assistance for ADLs and min assist with mobility.  He has had improvement in activity tolerance, balance, postural control as well as ability to compensate for deficits. He has had improvement in functional use RUE/LUE  and RLE/LLE  as well as improvement in awareness. Discharged modified independent  Disposition: Home Discharge disposition: 01-Home or Self Care     Diet: dysphagia 3 with thin liquids  Special Instructions:  No driving, alcohol consumption or tobacco use.  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   atorvastatin 40 MG tablet Commonly known as: LIPITOR Take 1 tablet (40 mg total) by mouth daily.   carvedilol 3.125 MG tablet Commonly known as: COREG Take 1 tablet (3.125 mg total) by mouth 2 (two) times daily with a meal.   COQ-10 PO Take 1 capsule by mouth daily.   Eliquis 5 MG Tabs tablet Generic drug: apixaban Take 5 mg by mouth 2 (two) times daily.   ferrous sulfate 325 (65 FE) MG tablet Take 325 mg by mouth daily with breakfast.   furosemide 20 MG tablet Commonly known as: LASIX Take 0.5 tablets (10 mg total) by mouth 2 (two) times daily. What changed:  medication strength how much to take when to take this   leptospermum manuka honey Pste paste Apply 1 Application topically daily.   levothyroxine 75 MCG tablet Commonly known as: SYNTHROID Take 75 mcg by mouth See admin  instructions. Take 75 mcg by mouth before breakfast on Mon/Tues/Wed/Thurs/Fri   polyethylene glycol 17 g packet Commonly known as: MIRALAX / GLYCOLAX Take 17 g by mouth daily.   senna-docusate 8.6-50 MG tablet Commonly known as: Senokot-S Take 1 tablet by mouth 2 (two) times daily.   traZODone 50 MG tablet Commonly known as: DESYREL Take 1 tablet (50 mg total) by mouth at bedtime.   Vitamin B Complex Tabs Take 1 tablet by mouth daily with breakfast.   Vitamin B-12 1000 MCG Subl Place 1,000 mcg under the tongue daily.   Vitamin D3 25 MCG (1000 UT) Caps Take 1,000 Units by mouth daily.        Follow-up Information     Nyra Jabs A, PA-C Follow up.   Specialty: Physician Assistant Why: Call in 1-2 days to make arrangements for hospital follow-up appointment Contact information: [address] [place] [address] [phone]         [facility] Follow up.   Why: Call in 1-2 days to make arrangements for hospital follow-up appointment Contact information: [address]     [address]  [place] [phone]        Charlett Blake, MD Follow up.   Specialty: Physical Medicine and Rehabilitation Why: office will call you to arrange your appt (sent) Contact information: [place]  [address] [phone]         Tenny Craw, MD. Schedule an appointment as soon as possible for a visit.   Specialty: Internal Medicine Contact information: [address] [place] [address] [phone]                 Signed: Barbie Banner 10/01/2021, 9:58 AM

## 2021-09-27 NOTE — Progress Notes (Signed)
Physical Therapy Session Note  Patient Details  Name: [name] MRN: [id number] Date of Birth: 10/09/1940  Today's Date: 09/27/2021 PT Individual Time: 4782-9562 PT Individual Time Calculation (min): 43 min   Short Term Goals: Week 1:  PT Short Term Goal 1 (Week 1): Pt will complete bed mobility with supervision PT Short Term Goal 2 (Week 1): Pt will complete bed<>chair transfers with CGA and LRAD PT Short Term Goal 3 (Week 1): Pt will ambulate 155ft with CGA and LRAD PT Short Term Goal 4 (Week 1): Pt will improve BERG balance test by at least 7 points Week 2:    Week 3:     Skilled Therapeutic Interventions/Progress Updates:   PAIN Denies pain Pt initially supine.  Dons pants w/set up.  Supine to sit w/supervision.  Sit to stand w/supervision. Gait to sink w/supervision. Brushes teeth and combs hair seated at sink independently.  Gait 123ft w/supervision, some cueing to attend to R environment.  Gait 127ft including picking up and setting down and carrying weighted basket w/close supervision, mild unsteadiness, wide base gait. Repeated x 2  Multitasking challenge: Gait 159ft passing weighted ball from L/R hand while counting in reverse by 5s from 100.  Singnificant slowing of gait and cues required to initiate counting, self corrects 2 errors.     10X Sit to stand w/supervision for functional strengthening.  Gait 147ft to room w/supervision.  Pt left oob in recliner w/chair alarm set and needs in reach.          Therapy Documentation Precautions:  Precautions Precautions: Fall Precaution Comments: monitor O2, HR, mild R hemi Restrictions Weight Bearing Restrictions: No      Therapy/Group: Individual Therapy Rada Hay, PT   Shearon Balo 09/27/2021, 9:48 AM

## 2021-09-27 NOTE — Progress Notes (Signed)
Occupational Therapy Session Note  Patient Details  Name: Eugene Campbell MRN: [id number] Date of Birth: June 02, 1940  Today's Date: 09/27/2021 OT Individual Time: 3086-5784 OT Individual Time Calculation (min): 60 min   Short Term Goals: Week 1:  OT Short Term Goal 1 (Week 1): Pt will complete shower transfer with CGA and LRAD. OT Short Term Goal 2 (Week 1): Pt will complete LBD with S and AE PRN. OT Short Term Goal 3 (Week 1): Pt will complete bathing at a S level and AE PRN.  Skilled Therapeutic Interventions/Progress Updates:     Pt greeted seated in recliner and agreeable to OT treatment session. Pt's friend visiting and prayed with patient prior to leaving. Pt ambulated to therapy gym with close supervision and no overt LOB. UB there-ex and standing balance while standing on foam block. UB there-ex 3 sets of 10 chest press, bicep curl, and straight arm raise- CGA for balance.  Functional ambulation and balance ambulating with saucer and trying to keep small ball in the center. Incorporated dual task activity by counting backwards and saying the months of the year while ambulating with balance ball. Pt with intermittent loss of balance with increased task demand. Problem solving and fine motor control with pipe tree puzzle. Pt complete pipe tree puzzle without cues. Pt ambulated back to room without AD and close supervision mostly, but CGA needed when turning corners.  Pt left seated in recliner with bed alarm on, call bell in reach, and needs met.   Therapy Documentation Precautions:  Precautions Precautions: Fall Precaution Comments: monitor O2, HR, mild R hemi Restrictions Weight Bearing Restrictions: No Pain:  Denies pain  Therapy/Group: Individual Therapy  Mal Amabile 09/27/2021, 2:00 PM

## 2021-09-28 DIAGNOSIS — I63512 Cerebral infarction due to unspecified occlusion or stenosis of left middle cerebral artery: Secondary | ICD-10-CM | POA: Diagnosis not present

## 2021-09-28 MED ORDER — OXYMETAZOLINE HCL 0.05 % NA SOLN
1.0000 | Freq: Two times a day (BID) | NASAL | Status: AC
Start: 2021-09-28 — End: 2021-10-01
  Administered 2021-09-28 – 2021-09-30 (×4): 1 via NASAL
  Filled 2021-09-28: qty 30

## 2021-09-28 NOTE — Progress Notes (Signed)
PROGRESS NOTE   Subjective/Complaints:  Had a nosebleed r/t nasal cannula   ROS- neg CP, SOB, N/V/D, fever, chills, HA, Cough. Objective:   No results found. Recent Labs    09/27/21 0623  WBC 9.1  HGB 11.9*  HCT 34.9*  PLT 201    Recent Labs    09/27/21 0623  NA 136  K 4.2  CL 105  CO2 26  GLUCOSE 107*  BUN 11  CREATININE 1.18  CALCIUM 8.2*     Intake/Output Summary (Last 24 hours) at 09/28/2021 0902 Last data filed at 09/28/2021 0720 Gross per 24 hour  Intake 694 ml  Output 1400 ml  Net -706 ml      Pressure Injury 09/19/21 Sacrum Medial Unstageable - Full thickness tissue loss in which the base of the injury is covered by slough (yellow, tan, gray, green or brown) and/or eschar (tan, brown or black) in the wound bed. progressed to an unstageable pr (Active)  09/19/21 1940  Location: Sacrum  Location Orientation: Medial  Staging: Unstageable - Full thickness tissue loss in which the base of the injury is covered by slough (yellow, tan, gray, green or brown) and/or eschar (tan, brown or black) in the wound bed.  Wound Description (Comments): progressed to an unstageable pressure wound; 09/27/21, noted yellow slough in center of wound. Pic added to EPIC  Present on Admission:     Physical Exam: Vital Signs Blood pressure 119/71, pulse 62, temperature 98 F (36.7 C), resp. rate 15, height 6' (1.829 m), weight 67.7 kg, SpO2 90 %.   General: No acute distress, in bed Mood and affect are appropriate, pleasant Heart: Regular rate and rhythm no rubs murmurs or extra sounds Lungs: Clear to auscultation, breathing unlabored, Left basilar rales , no wheezes Abdomen: Positive bowel sounds, soft nontender to palpation, nondistended Extremities: No clubbing, cyanosis, or edema Skin: No evidence of breakdown, no evidence of rash. Warm and dry. Neurologic: Cranial nerves II through XII intact, motor strength is  5/5 in left 4/5 right deltoid, bicep, tricep, grip, hip flexor, knee extensors, ankle dorsiflexor and plantar flexor Mild fine motor def Right finger to thumb  Musculoskeletal: Full range of motion in all 4 extremities. No joint swelling   Assessment/Plan: 1. Functional deficits which require 3+ hours per day of interdisciplinary therapy in a comprehensive inpatient rehab setting. Physiatrist is providing close team supervision and 24 hour management of active medical problems listed below. Physiatrist and rehab team continue to assess barriers to discharge/monitor patient progress toward functional and medical goals  Care Tool:  Bathing    Body parts bathed by patient: Face, Right arm, Left arm, Chest, Abdomen, Front perineal area, Buttocks, Right upper leg, Left upper leg, Right lower leg, Left lower leg         Bathing assist Assist Level: Contact Guard/Touching assist     Upper Body Dressing/Undressing Upper body dressing   What is the patient wearing?: Pull over shirt    Upper body assist Assist Level: Supervision/Verbal cueing    Lower Body Dressing/Undressing Lower body dressing      What is the patient wearing?: Incontinence brief     Lower body assist Assist  for lower body dressing: Moderate Assistance - Patient 50 - 74%     Toileting Toileting    Toileting assist Assist for toileting: Moderate Assistance - Patient 50 - 74% Assistive Device Comment: walker   Transfers Chair/bed transfer  Transfers assist     Chair/bed transfer assist level: Minimal Assistance - Patient > 75%     Locomotion Ambulation   Ambulation assist      Assist level: Minimal Assistance - Patient > 75% Assistive device: No Device Max distance: 174ft   Walk 10 feet activity   Assist     Assist level: Minimal Assistance - Patient > 75% Assistive device: No Device   Walk 50 feet activity   Assist    Assist level: Minimal Assistance - Patient > 75% Assistive  device: No Device    Walk 150 feet activity   Assist Walk 150 feet activity did not occur: Safety/medical concerns (fatigue)         Walk 10 feet on uneven surface  activity   Assist Walk 10 feet on uneven surfaces activity did not occur: Safety/medical concerns         Wheelchair     Assist Is the patient using a wheelchair?: No             Wheelchair 50 feet with 2 turns activity    Assist            Wheelchair 150 feet activity     Assist          Blood pressure 119/71, pulse 62, temperature 98 F (36.7 C), resp. rate 15, height 6' (1.829 m), weight 67.7 kg, SpO2 90 %.  Medical Problem List and Plan: 1. 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             -patient may shower             -ELOS/Goals: 10-12 days, team conf in am    CIR PT, OT SLP 2.  Antithrombotics: -DVT/anticoagulation:  TED hose -on Eliquis             -antiplatelet therapy: none 3. Pain Management: Tylenol prn 4. Mood/Sleep: LCSW to evaluate and provide emotional support, insomnia has prn trazodone but does not request will schedule it              -antipsychotic agents: n/a  -6/26 improved sleep last night 5. Neuropsych/cognition: This patient is capable of making decisions on his own behalf. 6. Skin/Wound Care: Routine skin care checks 7. Fluids/Electrolytes/Nutrition: Routine I's and O's and follow-up chemistries             --dysphagia 3 diet/thins 8: Respiratory failure/aspiration pneumonia: extubated 6/17- repeat CXR c/w pulm edema              -Denies shortness of breath, on room air 9: Chronic atrial fibrillation: continue Eliquis, Coreg             -- follows with Dr[facility] Ivan Croft [facility]             -Rate controlled overall, has intermittent bradycardia 10: Cardiomyopathy/HFrEF: EF 20-25% continue Coreg,  on Lasix 40mg  qam at home held due to AKI will resume low dose given CXR result and hypoxia Resumed at 10mg  per day, increase to BID 6/25  has left basilar rales               -  daily weight, good output on BID lasix 11: Ileus: Resolved, tolerating dysphagia 3 diet 12: Diabetes mellitus: continue SSI q ACHS; CBG monitoring, HGB A1C 5.9 on 6/14 13: Acute kidney injury atop chronic kidney disease stage III: Follow-up BMP --BUN and creatinine normal 6/19 14: 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 Quit 46yrs ago, pt is poor historian 74: Constipation             --Has been on Miralax and senokot, Consider additional laxatives  -6/26 last BM yesterday-improved 20: Urinary incontinence: has external urinary catheter 21. Dysphagia Dys 3/thin diet             -Continue SLP  22.  THrombocytopenia improving last PLT 101K, cont to monitor , received heparin for intravascular procedure , cont to monitor no signs of bleeding -6/26 Platelets 201- resolved 23.  Infiltrate vs effusion , WBC mildly elevated but no fever, pt feels ok, hypoxia at noc, on furosemide 40mg  per day at home , held due to AKI will resume low dose and monitor BMET , if increasing SOB or pulm edema will need to increase dose  UO improved losing fluid weight , LLL crackles improved    LOS: 6 days A FACE TO FACE EVALUATION WAS PERFORMED  Eugene Campbell 09/28/2021, 9:02 AM

## 2021-09-29 DIAGNOSIS — I63512 Cerebral infarction due to unspecified occlusion or stenosis of left middle cerebral artery: Secondary | ICD-10-CM | POA: Diagnosis not present

## 2021-09-29 NOTE — Progress Notes (Signed)
Occupational Therapy Session Note  Patient Details  Name: Eugene Campbell MRN: [id number] Date of Birth: [birth year]  Today's Date: 09/29/2021 OT Individual Time: 1300-1400 OT Individual Time Calculation (min): 60 min    Short Term Goals: Week 1:  OT Short Term Goal 1 (Week 1): Pt will complete shower transfer with CGA and LRAD. OT Short Term Goal 2 (Week 1): Pt will complete LBD with S and AE PRN. OT Short Term Goal 3 (Week 1): Pt will complete bathing at a S level and AE PRN.  Skilled Therapeutic Interventions/Progress Updates:     S: Patient agreeable to participate in OT session. Reports 0/10 pain level.    Patient participated in skilled OT session focusing on dynamic standing balance while participating in functional activities requiring periods of maintaining a single leg stance and lateral stepping. Therapist facilitated session by progressing each activities difficulty level starting in a seated position, to standing, to use of stable surface, to incorporating an unstable stable surface, then finishing with lateral side steps (right/left); without hurdles initially then added hurdles half way through in order to improve dynamic standing balance when maneuvering and navigating within his home environment and in the community. Pt provided CGA for balance with lower level activities and with higher balance tasks, therapist provided Min assist for safety. Pt demonstrated right side muscle fatigue at end of session. Was provided with periodic seated rest breaks.        Therapy Documentation Precautions:  Precautions Precautions: Fall Precaution Comments: monitor O2, HR, mild R hemi Restrictions Weight Bearing Restrictions: No   Therapy/Group: Individual Therapy  Limmie Patricia, OTR/L,CBIS  Supplemental OT - [facility]  09/29/2021, 7:59 AM

## 2021-09-29 NOTE — Progress Notes (Signed)
PROGRESS NOTE   Subjective/Complaints:  On Afrin spray for nosebleed , no other issues , occ cough non productive   ROS- neg CP, SOB, N/V/D, fever, chills, HA, Cough. Objective:   No results found. Recent Labs    09/27/21 0623  WBC 9.1  HGB 11.9*  HCT 34.9*  PLT 201    Recent Labs    09/27/21 0623  NA 136  K 4.2  CL 105  CO2 26  GLUCOSE 107*  BUN 11  CREATININE 1.18  CALCIUM 8.2*     Intake/Output Summary (Last 24 hours) at 09/29/2021 0915 Last data filed at 09/29/2021 0400 Gross per 24 hour  Intake 474 ml  Output 1100 ml  Net -626 ml      Pressure Injury 09/19/21 Sacrum Medial Unstageable - Full thickness tissue loss in which the base of the injury is covered by slough (yellow, tan, gray, green or brown) and/or eschar (tan, brown or black) in the wound bed. progressed to an unstageable pr (Active)  09/19/21 1940  Location: Sacrum  Location Orientation: Medial  Staging: Unstageable - Full thickness tissue loss in which the base of the injury is covered by slough (yellow, tan, gray, green or brown) and/or eschar (tan, brown or black) in the wound bed.  Wound Description (Comments): progressed to an unstageable pressure wound; 09/27/21, noted yellow slough in center of wound. Pic added to EPIC  Present on Admission:     Physical Exam: Vital Signs Blood pressure 129/77, pulse 61, temperature 98.2 F (36.8 C), resp. rate 19, height 6' (1.829 m), weight 65.8 kg, SpO2 92 %.   General: No acute distress, in bed Mood and affect are appropriate, pleasant Heart: Regular rate and rhythm no rubs murmurs or extra sounds Lungs: Clear to auscultation, breathing unlabored, Left basilar rales , no wheezes Abdomen: Positive bowel sounds, soft nontender to palpation, nondistended Extremities: No clubbing, cyanosis, or edema Skin: No evidence of breakdown, no evidence of rash. Warm and dry. Neurologic: Cranial nerves  II through XII intact, motor strength is 5/5 in left 4/5 right deltoid, bicep, tricep, grip, hip flexor, knee extensors, ankle dorsiflexor and plantar flexor Mild fine motor def Right finger to thumb  Musculoskeletal: Full range of motion in all 4 extremities. No joint swelling   Assessment/Plan: 1. Functional deficits which require 3+ hours per day of interdisciplinary therapy in a comprehensive inpatient rehab setting. Physiatrist is providing close team supervision and 24 hour management of active medical problems listed below. Physiatrist and rehab team continue to assess barriers to discharge/monitor patient progress toward functional and medical goals  Care Tool:  Bathing    Body parts bathed by patient: Face, Right arm, Left arm, Chest, Abdomen, Front perineal area, Buttocks, Right upper leg, Left upper leg, Right lower leg, Left lower leg         Bathing assist Assist Level: Contact Guard/Touching assist     Upper Body Dressing/Undressing Upper body dressing   What is the patient wearing?: Pull over shirt    Upper body assist Assist Level: Supervision/Verbal cueing    Lower Body Dressing/Undressing Lower body dressing      What is the patient wearing?: Incontinence brief  Lower body assist Assist for lower body dressing: Moderate Assistance - Patient 50 - 74%     Toileting Toileting    Toileting assist Assist for toileting: Moderate Assistance - Patient 50 - 74% Assistive Device Comment: walker   Transfers Chair/bed transfer  Transfers assist     Chair/bed transfer assist level: Minimal Assistance - Patient > 75%     Locomotion Ambulation   Ambulation assist      Assist level: Minimal Assistance - Patient > 75% Assistive device: No Device Max distance: 163f   Walk 10 feet activity   Assist     Assist level: Minimal Assistance - Patient > 75% Assistive device: No Device   Walk 50 feet activity   Assist    Assist level: Minimal  Assistance - Patient > 75% Assistive device: No Device    Walk 150 feet activity   Assist Walk 150 feet activity did not occur: Safety/medical concerns (fatigue)         Walk 10 feet on uneven surface  activity   Assist Walk 10 feet on uneven surfaces activity did not occur: Safety/medical concerns         Wheelchair     Assist Is the patient using a wheelchair?: No             Wheelchair 50 feet with 2 turns activity    Assist            Wheelchair 150 feet activity     Assist          Blood pressure 129/77, pulse 61, temperature 98.2 F (36.8 C), resp. rate 19, height 6' (1.829 m), weight 65.8 kg, SpO2 92 %.  Medical Problem List and Plan: 1. 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             -patient may shower             -ELOS/Goals: 10-12 days, Team conference today please see physician documentation under team conference tab, met with team  to discuss problems,progress, and goals. Formulized individual treatment plan based on medical history, underlying problem and comorbidities.    CIR PT, OT SLP 2.  Antithrombotics: -DVT/anticoagulation:  TED hose -on Eliquis             -antiplatelet therapy: none 3. Pain Management: Tylenol prn 4. Mood/Sleep: LCSW to evaluate and provide emotional support, insomnia has prn trazodone but does not request will schedule it              -antipsychotic agents: n/a  -6/26 improved sleep last night 5. Neuropsych/cognition: This patient is capable of making decisions on his own behalf. 6. Skin/Wound Care: Routine skin care checks 7. Fluids/Electrolytes/Nutrition: Routine I's and O's and follow-up chemistries             --dysphagia 3 diet/thins 8: Respiratory failure/aspiration pneumonia: extubated 6/17- repeat CXR c/w pulm edema              -Denies  shortness of breath, on room air 9: Chronic atrial fibrillation: continue Eliquis, Coreg             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% continue Coreg, on Lasix 48mqam at home held due to AKI will resume  low dose given CXR result and hypoxia Resumed at 52m per day, increase to BID 6/25  has left basilar rales               -daily weight, good output on BID lasix, daily net -3827myesterday  11: Ileus: Resolved, tolerating dysphagia 3 diet 12: Diabetes mellitus: continue SSI q ACHS; CBG monitoring, HGB A1C 5.9 on 6/14 13: Acute kidney injury atop chronic kidney disease stage III: Follow-up BMP --BUN and creatinine normal 6/19 14: 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 Quit 5039yrgo, pt is poor historian 19:48onstipation             --Has been on Miralax and senokot, Consider additional laxatives  -6/26 last BM yesterday-improved 20: Urinary incontinence: has external urinary catheter 21. Dysphagia Dys 3/thin diet             -Continue SLP  22.  THrombocytopenia improving last PLT 101K, cont to monitor , received heparin for intravascular procedure , cont to monitor no signs of bleeding -6/26 Platelets 201- resolved 23.  Infiltrate vs effusion , WBC mildly elevated but no fever, pt feels ok, hypoxia at noc, on furosemide 11m28mr day at home , held due to AKI will resume low dose and monitor BMET , if increasing SOB or pulm edema will need to increase dose  UO improved losing fluid weight , LLL crackles improved    LOS: 7 days A FACE TO FACE EVALUATION WAS PERFORMED  AndrCharlett Blake8/2023, 9:15 AM

## 2021-09-29 NOTE — Progress Notes (Signed)
Physical Therapy Session Note  Patient Details  Name: Eugene Campbell MRN: [id number] Date of Birth: 1940/11/17  Today's Date: 09/29/2021 PT Individual Time: 0906-1004 PT Individual Time Calculation (min): 58 min   Short Term Goals: Week 1:  PT Short Term Goal 1 (Week 1): Pt will complete bed mobility with supervision PT Short Term Goal 2 (Week 1): Pt will complete bed<>chair transfers with CGA and LRAD PT Short Term Goal 3 (Week 1): Pt will ambulate 15ft with CGA and LRAD PT Short Term Goal 4 (Week 1): Pt will improve BERG balance test by at least 7 points   Skilled Therapeutic Interventions/Progress Updates:  Patient supine in bed on entrance to room. Patient alert and agreeable to PT session.   Patient with no pain complaint throughout session. Continues to demo dry cough. MD present for morning rounding.  Therapeutic Activity: Pt ambulates room collecting clothing and shoes in order to get dressed for therapy session. Completes all with safe choices and distant supervision.  Bed Mobility: Patient performed supine <> sit with Mod I.  Transfers: Patient performed sit<>stand and stand pivot transfers throughout session with distant supervision. No vc required for technique.   Gait Training:  Patient ambulated 64' x2 using no AD with supervision. Provided vc/ tc for providing adequate spacing for obstacles. Never runs into obstacles but walks close to all.  Neuromuscular Re-ed: NMR facilitated during session with focus on standing balance and functional gait. Pt guided in agility ladder training. Progressed from high knee stepping to toe taps on 6" cones to one side and then reciprocally. Pt with heavy stpping to cones and several minor LOB but is able to self correct with good stepping strategy. Is able to self assess and correct throughout. NMR performed for improvements in motor control and coordination, balance, sequencing, judgement, and self confidence/ efficacy in performing all  aspects of mobility at highest level of independence.    Patient seated upright  in recliner at end of session with brakes locked, seat pad alarm set, and all needs within reach.  Therapy Documentation Precautions:  Precautions Precautions: Fall Precaution Comments: monitor O2, HR, mild R hemi Restrictions Weight Bearing Restrictions: No General:   Vital Signs:   Pain:  No pain related this session.  Therapy/Group: Individual Therapy  Loel Dubonnet PT, DPT, CSRS 09/29/2021, 1:09 PM

## 2021-09-29 NOTE — Discharge Instructions (Addendum)
Inpatient Rehab Discharge Instructions  Eugene Campbell Discharge date and time: 10/01/2021   Activities/Precautions/ Functional Status: Activity: no lifting, driving, or strenuous exercise until cleared by MD Diet:  dysphagia 3, thin liquids>>cut solid foods into small pieces, go slow and avoid distractions Wound Care: none needed Functional status:  ___ No restrictions     ___ Walk up steps independently ___ 24/7 supervision/assistance   ___ Walk up steps with assistance __x_ Intermittent supervision/assistance  ___ Bathe/dress independently ___ Walk with walker     ___ Bathe/dress with assistance ___ Walk Independently    ___ Shower independently ___ Walk with assistance    __x_ Shower with assistance _x__ No alcohol     ___ Return to work/school ________ COMMUNITY REFERRALS UPON DISCHARGE:     Outpatient: PT     OT    ST                Agency: [facility] Phone: [phone]              Appointment Date/Time: TBD   Special Instructions:  No driving, alcohol consumption or tobacco use.   STROKE/TIA DISCHARGE INSTRUCTIONS SMOKING Cigarette smoking nearly doubles your risk of having a stroke & is the single most alterable risk factor  If you smoke or have smoked in the last 12 months, you are advised to quit smoking for your health. Most of the excess cardiovascular risk related to smoking disappears within a year of stopping. Ask you doctor about anti-smoking medications [place] Quit Line: 1-800-QUIT NOW Free Smoking Cessation Classes [phone]  CHOLESTEROL Know your levels; limit fat & cholesterol in your diet  Lipid Panel     Component Value Date/Time   CHOL 133 09/16/2021 0409   TRIG 66 09/19/2021 0347   HDL 59 09/16/2021 0409   CHOLHDL 2.3 09/16/2021 0409   VLDL 8 09/16/2021 0409   LDLCALC 66 09/16/2021 0409     Many patients benefit from treatment even if their cholesterol is at goal. Goal: Total Cholesterol (CHOL) less than 160 Goal:   Triglycerides (TRIG) less than 150 Goal:  HDL greater than 40 Goal:  LDL (LDLCALC) less than 100   BLOOD PRESSURE American Stroke Association blood pressure target is less that 120/80 mm/Hg  Your discharge blood pressure is:  BP: (!) 126/54 Monitor your blood pressure Limit your salt and alcohol intake Many individuals will require more than one medication for high blood pressure  DIABETES (A1c is a blood sugar average for last 3 months) Goal HGBA1c is under 7% (HBGA1c is blood sugar average for last 3 months)  Diabetes: Diagnosis of diabetes:  Your A1c:5.9 %    Lab Results  Component Value Date   HGBA1C 5.9 (H) 09/15/2021    Your HGBA1c can be lowered with medications, healthy diet, and exercise. Check your blood sugar as directed by your physician Call your physician if you experience unexplained or low blood sugars.  PHYSICAL ACTIVITY/REHABILITATION Goal is 30 minutes at least 4 days per week  Activity: Increase activity slowly, Therapies: Physical Therapy: Home Health, Occupational Therapy: Home Health, and Speech Therapy: Home Health Return to work: n/a Activity decreases your risk of heart attack and stroke and makes your heart stronger.  It helps control your weight and blood pressure; helps you relax and can improve your mood. Participate in a regular exercise program. Talk with your doctor about the best form of exercise for you (dancing, walking, swimming, cycling).  DIET/WEIGHT Goal is to maintain a  healthy weight  Your discharge diet is:  Diet Order             DIET DYS 3 Room service appropriate? Yes with Assist; Fluid consistency: Thin  Diet effective now                  thin liquids Your height is:  Height: 6' (182.9 cm) Your current weight is: Weight: 65.7 kg Your Body Mass Index (BMI) is:  BMI (Calculated): 19.64 Following the type of diet specifically designed for you will help prevent another stroke. Your goal weight range is:   Your goal Body Mass Index  (BMI) is 19-24. Healthy food habits can help reduce 3 risk factors for stroke:  High cholesterol, hypertension, and excess weight.  RESOURCES Stroke/Support Group:  Call [phone]   STROKE EDUCATION PROVIDED/REVIEWED AND GIVEN TO PATIENT Stroke warning signs and symptoms How to activate emergency medical system (call 911). Medications prescribed at discharge. Need for follow-up after discharge. Personal risk factors for stroke. Pneumonia vaccine given: No Flu vaccine given: No My questions have been answered, the writing is legible, and I understand these instructions.  I will adhere to these goals & educational materials that have been provided to me after my discharge from the hospital.      My questions have been answered and I understand these instructions. I will adhere to these goals and the provided educational materials after my discharge from the hospital.  Patient/Caregiver Signature _______________________________ Date __________  Clinician Signature _______________________________________ Date __________  Please bring this form and your medication list with you to all your follow-up doctor's appointments.

## 2021-09-29 NOTE — Progress Notes (Signed)
Physical Therapy Session Note  Patient Details  Name: Eugene Campbell MRN: [id number] Date of Birth: 1940-12-06  Today's Date: 09/29/2021 PT Individual Time: 1430-1500 and 1535-1630 PT Individual Time Calculation (min): 30 min and 55 min   Short Term Goals: Week 1:  PT Short Term Goal 1 (Week 1): Pt will complete bed mobility with supervision PT Short Term Goal 2 (Week 1): Pt will complete bed<>chair transfers with CGA and LRAD PT Short Term Goal 3 (Week 1): Pt will ambulate 174f with CGA and LRAD PT Short Term Goal 4 (Week 1): Pt will improve BERG balance test by at least 7 points  Skilled Therapeutic Interventions/Progress Updates:   Session 1.  Pt received sitting in WC and agreeable to PT. Gait training through hall with supervision assist from PT with cues for safety initially through obstacles in room. Gait training performed 2 x 1831fto and from rehab gym. PT instructed pt in dynamic balance training on Biodex, AP/lateral weight shift 2 x 1 min each. LOS low difficulty x 2 and medium difficulty x1. Maze low difficulty x2 with min assist and moderate cues for ankle strategy for COM control. PT also instruction pt in modified [place] balance program in parallel bars x10 Bil. Pt returned to room and performed stand pivot transfer to bed with supervision assist. Sit>supine completed without assist, and left supine in bed with call bell in reach and all needs met.    Session 2.  Pt received supine in bed and agreeable to PT. Supine>sit transfer without assist or cues from PT. Gait training without AD 2x 12045fith cues or assist from PT. PT instructed pt in dynamic balance training while egaged in wii bowling standing on level surface x 2 frames, standing on blue airex pad posterior bias x 5 frames and anterior bias x 3 frames with min assist from PT while on blue wedge. Therapeutic rest break due mild knee pain. Dynamic balance training also performed on wii fit talbe tilt x 2 and  penguin slide x 2. Min assist for improved ankle strategy to control COM. Pt returned to room and performed ambulatory transfer to bed with supervision assist from PT for safety. Sit>supine completed without assist, and left supine in bed with call bell in reach and all needs met.         Therapy Documentation Precautions:  Precautions Precautions: Fall Precaution Comments: monitor O2, HR, mild R hemi Restrictions Weight Bearing Restrictions: No  Vital Signs: Therapy Vitals BP: (!) 134/102 Pain:  denies      Therapy/Group: Individual Therapy  AusLorie Phenix28/2023, 6:04 PM

## 2021-09-29 NOTE — Patient Care Conference (Signed)
Inpatient RehabilitationTeam Conference and Plan of Care Update Date: 09/29/2021   Time: 10:16 AM    Patient Name: Eugene Campbell      Medical Record Number: [id number]  Date of Birth: 1940-12-02 Sex: Male         Room/Bed: 4W21C/4W21C-01 Payor Info: Payor: [employer] MEDICARE / Plan: [employer] MEDICARE HMO / Product Type: *No Product type* /    Admit Date/Time:  09/22/2021  5:51 PM  Primary Diagnosis:  Acute ischemic left MCA stroke [facility])  Hospital Problems: Principal Problem:   Acute ischemic left MCA stroke ([name]) Active Problems:   Aspiration pneumonia ([name])   Oropharyngeal dysphagia   Chronic systolic heart failure ([name])   Stage 3 chronic kidney disease ([name])   Drug-induced constipation    Expected Discharge Date: Expected Discharge Date: 10/01/21  Team Members Present: Physician leading conference: Dr. Claudette Laws Social Worker Present: Lavera Guise, BSW Nurse Present: Chana Bode, RN PT Present: Grier Rocher, PT OT Present: Other (comment) Limmie Patricia, OT) SLP Present: Eilene Ghazi, SLP PPS Coordinator present : Edson Snowball, PT     Current Status/Progress Goal Weekly Team Focus  Bowel/Bladder   Patient is continent of bladder/bowel  Maintain continence  Assess toileting needs QS/PRN   Swallow/Nutrition/ Hydration   dys 3 diet, thin liquids, sup A  sup A  tolerance of current diet with implenetation of swallowing precautions   ADL's   Supervision overall intermittent CGA when turning walking without AD  Mod I/supervision  self-care retraining, safety awareness, dc planning, pt/family education   Mobility   Currently c/o SOB after mobility recent drop in SpO2 <90 with outdoor ambulation; Bed mobility = ModI/ IND; Transfers = supervision; gait = supervision for community distances; standing balance at MinA/ CGA level when challenged  Mod I for bed mobility and transfers, supervision for gait and completing 4 steps  General conditioning and endurance  training, dynamic standing balance ,dynamic gait with LRAD, transfers   Communication   sup A  sup A  high level word finding   Safety/Cognition/ Behavioral Observations  sup-to-min A  sup A  compensatory memory strategies, emergent awareness, problem solving   Pain   Denies Pain  < 3/10  Assess QS/PRN   Skin   has stage 2 to sacral area, dressing with med honey QD and prn  Promote healing and prevent infection  Assess wound QS/PRNQS/PRN assess, repostion Q2 hrs and PRN,     Discharge Planning:  Discharging home with spouse [facility]) and step daughter, daughter able to assist PRN   Team Discussion: Patient post left MCA CVA with tremors and HF hx; EF 25%.  Working on [employer], awareness, problem solving, medication management, etc.  Patient on target to meet rehab goals: yes, currently needs SBA overall for bathing and dressing. Needs min assist for challenging activity but only mod I for basic transfers and CGA for turning corners. Able to ambulate up to 1000'. Requires supervision for communications and min assist for memory. Goals for discharge set for supervision - mod I overall.  *See Care Plan and progress notes for long and short-term goals.   Revisions to Treatment Plan:  D3 diet 2/2 poor fit of dentures   Teaching Needs: Safety, medications, dietary modifications, transfers, toileting, etc  Current Barriers to Discharge: Decreased caregiver support and Home enviroment access/layout  Possible Resolutions to Barriers: Family education OP neuro OT DME: already has a Control and instrumentation engineer Current Status: CHF exacerbation improved  Barriers to Discharge: Medical stability  Possible Resolutions to [employer] Focus: manage diuretics, manage activity to accomodate hypoxia   Continued Need for Acute Rehabilitation Level of Care: The patient requires daily medical management by a physician with specialized training in physical medicine and rehabilitation for the following  reasons: Direction of a multidisciplinary physical rehabilitation program to maximize functional independence : Yes Medical management of patient stability for increased activity during participation in an intensive rehabilitation regime.: Yes Analysis of laboratory values and/or radiology reports with any subsequent need for medication adjustment and/or medical intervention. : Yes   I attest that I was present, lead the team conference, and concur with the assessment and plan of the team.   Pamelia Hoit 09/29/2021, 4:47 PM

## 2021-09-29 NOTE — Progress Notes (Signed)
Patient ID: Eugene Campbell, male   DOB: 10-22-1940, 81 y.o.   MRN: [id number]  Team Conference Report to Patient/Family  Team Conference discussion was reviewed with the patient and caregiver, including goals, any changes in plan of care and target discharge date.  Patient and caregiver express understanding and are in agreement.  The patient has a target discharge date of 10/01/21.  Sw met with patient, spouse and daughter to provide team conference updates. Patient and family are pleased with anticipated discharge date. Family will be present for family education tomorrow 1-4 PM. No additional questions or concerns, sw will continue to follow up.   Dyanne Iha 09/29/2021, 1:58 PM

## 2021-09-30 DIAGNOSIS — I63512 Cerebral infarction due to unspecified occlusion or stenosis of left middle cerebral artery: Secondary | ICD-10-CM | POA: Diagnosis not present

## 2021-09-30 NOTE — Progress Notes (Signed)
Inpatient Rehabilitation Discharge Medication Review by a Pharmacist  A complete drug regimen review was completed for this patient to identify any potential clinically significant medication issues.  High Risk Drug Classes Is patient taking? Indication by Medication  Antipsychotic No   Anticoagulant Yes Eliquis - afib (on PTA)  Antibiotic No   Opioid No   Antiplatelet No   Hypoglycemics/insulin No   Vasoactive Medication Yes Carvedilol, furosemide  Chemotherapy No   Other Yes Atorvastatin - HLD Levothyroxine - low thyroid Trazodone - sleep     Type of Medication Issue Identified Description of Issue Recommendation(s)  Drug Interaction(s) (clinically significant)     Duplicate Therapy     Allergy     No Medication Administration End Date     Incorrect Dose     Additional Drug Therapy Needed     Significant med changes from prior encounter (inform family/care partners about these prior to discharge).    Other       Clinically significant medication issues were identified that warrant physician communication and completion of prescribed/recommended actions by midnight of the next day:  No  Pharmacist comments: None  Time spent performing this drug regimen review (minutes):  20 minutes   Elwin Sleight 09/30/2021 1:16 PM

## 2021-09-30 NOTE — Progress Notes (Signed)
Occupational Therapy Discharge Summary  Patient Details  Name: Eugene Campbell MRN: [id number] Date of Birth: 02/01/1941  Today's Date: 09/30/2021 OT Individual Time: 1300-1400 OT Individual Time Calculation (min): 60 min    Patient has met 14 of 14 long term goals due to improved activity tolerance, improved balance, postural control, functional use of  RIGHT upper extremity, improved attention, improved awareness, and improved coordination.  Patient to discharge at overall Modified Independent level.  Patient's care partner is independent to provide the necessary     assistance at discharge.      Recommendation:  Patient will benefit from ongoing skilled OT services in outpatient setting to continue to advance functional skills in the area of iADL.  Equipment: No equipment provided. Therapist recommended use of shower chair when bathing. Pt believes they have one in storage.   Reasons for discharge: treatment goals met  Patient/family agrees with progress made and goals achieved: Yes   Skilled intervention:  S: Patient agreeable to participate in OT session. Reports 0/10 pain level. Family present for family education.    Patient participated in skilled OT session focusing on patient/caregiver education followed by high level dynamic standing balance activity. Therapist educated family members on patient's current functional performance during self care tasks including any safety concerns or use of compensatory strategies to increase safety and decrease the risk of falls. Tub/shower transfer was completed with therapist educating patient on appropriate technique while simulating patient's person tub/shower set-up. Utilized grab bar at head of shower and shower seat without back at SBA level. Therapist educated patient on two transfer techniques that he could use upon stepping in/out of tub. Pt demonstrated understanding. Education provided on slowing down the pace of completing  activities at home, taking frequent rest breaks, and monitoring patient's exertion level. Suggested using a stool for sitting in the garden when working. All education was completed and all questions were answered. With remaining time, patient ambulated to large therapy gym with SBA to complete dynamic standing activity. While standing on flat surface of Bosu ball, patient completed BUE   shoulder strengthening exercises and functional reaching task using resistive clothespins. Pt was provided with Mod-max A to maintain balance with VC for technique and form as needed. Pt was provided 2 seated rest breaks during activities. Simulated ADL bathing, dressing, grooming, and functional transfers for discharge. See below for patient's current levels.   OT Discharge Precautions/Restrictions  Precautions Precautions: None Precaution Comments: Mild R hemi Restrictions Weight Bearing Restrictions: No Pain Pain Assessment Pain Scale: 0-10 Pain Score: 0-No pain ADL ADL Eating: Modified independent Where Assessed-Eating: Chair Grooming: Modified independent Upper Body Bathing: Modified independent Where Assessed-Upper Body Bathing: Sitting at sink Lower Body Bathing: Modified independent Where Assessed-Lower Body Bathing: Standing at sink Upper Body Dressing: Independent Where Assessed-Upper Body Dressing: Sitting at sink Lower Body Dressing: Modified independent Where Assessed-Lower Body Dressing: Sitting at sink Toileting: Modified independent Where Assessed-Toileting: Toilet, Bedside Commode Toilet Transfer: Modified independent Armed forces technical officer Method: Counselling psychologist: Energy manager: Close supervison Clinical cytogeneticist Method:  (Step over edge) Insurance underwriter: Civil engineer, contracting without back Social research officer, government: Not assessed Vision Baseline Vision/History: 1 Wears glasses Patient Visual Report: No change from baseline Vision Assessment?: No  apparent visual deficits Perception  Perception: Within Functional Limits Praxis Praxis: Intact Cognition Cognition Overall Cognitive Status: Within Functional Limits for tasks assessed Arousal/Alertness: Awake/alert Memory: Impaired Memory Impairment: Retrieval deficit;Decreased recall of new information Attention: Selective;Sustained Sustained Attention: Appears intact Selective Attention: Impaired Selective  Attention Impairment: Verbal complex;Functional complex;Functional basic Awareness: Impaired Awareness Impairment: Emergent impairment;Anticipatory impairment Problem Solving: Impaired Problem Solving Impairment: Functional complex;Verbal complex Safety/Judgment: Impaired Brief Interview for Mental Status (BIMS) Repetition of Three Words (First Attempt): 3 Temporal Orientation: Year: Correct Temporal Orientation: Month: Accurate within 5 days Temporal Orientation: Day: Correct Recall: "Sock": Yes, no cue required Recall: "Blue": Yes, no cue required Recall: "Bed": Yes, no cue required BIMS Summary Score: 15 Sensation Sensation Light Touch: Appears Intact Hot/Cold: Appears Intact Proprioception: Appears Intact Stereognosis: Appears Intact Coordination Gross Motor Movements are Fluid and Coordinated: Yes Fine Motor Movements are Fluid and Coordinated: Yes  Extremity/Trunk Assessment RUE Assessment RUE Assessment: Within Functional Limits General Strength Comments: Demonstrates functional strength during tasks performed today. Improved from evaluation. Very mild weakness noted. LUE Assessment LUE Assessment: Within Functional Limits   Ailene Ravel, OTR/L,CBIS  Supplemental OT - [facility]  09/30/2021, 4:15 PM

## 2021-09-30 NOTE — Progress Notes (Signed)
Patient arrived via bed from 3 W. Patient is A&O x 4. Daughter Alexis at bed side. Denies pain or discomfort, Skin CDI. Bilateral lungs clear, ABD soft non distended active x 4. Positive pedal pulses, cap refill less than 3 seconds and warm to the touch. Understands the use of the call light, bed in low position. Personal items within reach.

## 2021-09-30 NOTE — Progress Notes (Signed)
Patient resting throughout shift, with no c/o or noted distress, Denies pain. Monitor and assisted as needed. Continue regime, call bell  and bed alarm in place.

## 2021-09-30 NOTE — Progress Notes (Signed)
Inpatient Rehabilitation Care Coordinator Discharge Note   Patient Details  Name: Eugene Campbell MRN: [id number] Date of Birth: 1941/03/18   Discharge location: Home  Length of Stay: 9 Days  Discharge activity level: Sup/cga  Home/community participation: spouse and daughter  Patient response MM:HWKGSU Literacy - How often do you need to have someone help you when you read instructions, pamphlets, or other written material from your doctor or pharmacy?: Never  Patient response PJ:SRPRXY Isolation - How often do you feel lonely or isolated from those around you?: Never  Services provided included: MD, RD, PT, SLP, OT, RN, CM, TR, Pharmacy, SW  Financial Services:  Field seismologist Utilized: Private Insurance [employer]  Choices offered to/list presented to: spouse and daughter  Follow-up services arranged:  Outpatient    Outpatient Servicies: [facility]      Patient response to transportation need: Is the patient able to respond to transportation needs?: Yes In the past 12 months, has lack of transportation kept you from medical appointments or from getting medications?: No In the past 12 months, has lack of transportation kept you from meetings, work, or from getting things needed for daily living?: No    Comments (or additional information):  Patient/Family verbalized understanding of follow-up arrangements:  Yes  Individual responsible for coordination of the follow-up plan: daughter  Confirmed correct DME delivered: Andria Rhein 09/30/2021    Andria Rhein

## 2021-09-30 NOTE — Progress Notes (Signed)
Physical Therapy Discharge Summary  Patient Details  Name: Jamorris Ndiaye MRN: [id number] Date of Birth: 08-19-1940  Today's Date: 09/30/2021 PT Individual Time: 3299-2426 PT Individual Time Calculation (min): 69 min    Patient has met 8 of 8 long term goals due to improved activity tolerance, improved balance, improved postural control, increased strength, functional use of  right upper extremity and right lower extremity, and improved coordination.  Patient to discharge at an ambulatory level Modified Independent.   Patient's care partner is independent to provide the necessary physical assistance at discharge.  Reasons goals not met: All PT goals met  Recommendation:  Patient will benefit from ongoing skilled PT services in outpatient setting to continue to advance safe functional mobility, address ongoing impairments in balance, coordination, endurance, and minimize fall risk.  Equipment: No equipment provided  Reasons for discharge: treatment goals met and discharge from hospital  Patient/family agrees with progress made and goals achieved: Yes PT treatment.   Pt received sitting in recliner and agreeable to PT. Pt performed sit<>stand transfer without assist or cues from PT for safety. PT instructed pt in Grad day assessment to measure progress toward goals. See below for details. CARETool mobility assessment  also completed; see CAREtool tab in navigator for details. Car transfer training without assist from PT but education from PT for sit>pivot when fatigued. Patient demonstrates increased fall risk as noted by score of   /56 on Berg Balance Scale.  47(<36= high risk for falls, close to 100%; 37-45 significant >80%; 46-51 moderate >50%; 52-55 lower >25%) Patient demonstrates increased fall risk as noted by score of 24/30 on  Functional Gait Assessment.   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   Pt returned to room and performed ambulatory  transfer to bed without assist. Sit>supine completed with without assist, and left supine in bed with call bell in reach and all needs met.     PT Discharge Precautions/Restrictions Precautions Precautions: None Precaution Comments: Mild R hemi Restrictions Weight Bearing Restrictions: No Vital Signs Therapy Vitals Temp: (!) 97.4 F (36.3 C) Pulse Rate: (!) 53 Resp: 19 BP: 119/74 Oxygen Therapy SpO2: 94 % O2 Device: Room Air Pain Pain Assessment Pain Scale: 0-10 Pain Score: 0-No pain Pain Interference Pain Interference Pain Effect on Sleep: 0. Does not apply - I have not had any pain or hurting in the past 5 days Pain Interference with Therapy Activities: 1. Rarely or not at all Pain Interference with Day-to-Day Activities: 1. Rarely or not at all Vision/Perception  Vision - History Ability to See in Adequate Light: 0 Adequate Perception Perception: Within Functional Limits Praxis Praxis: Intact  Cognition Overall Cognitive Status: Within Functional Limits for tasks assessed Arousal/Alertness: Awake/alert Orientation Level: Oriented X4 Year: 2023 Month: June Day of Week: Correct Attention: Selective;Sustained Sustained Attention: Appears intact Selective Attention: Impaired Selective Attention Impairment: Verbal complex;Functional complex;Functional basic Memory: Impaired Memory Impairment: Retrieval deficit;Decreased recall of new information Awareness: Impaired Awareness Impairment: Emergent impairment;Anticipatory impairment Problem Solving: Impaired Problem Solving Impairment: Functional complex;Verbal complex Safety/Judgment: Impaired Sensation Sensation Light Touch:  Appears Intact Hot/Cold: Appears Intact Proprioception: Appears Intact Stereognosis: Appears Intact Coordination Gross Motor Movements are Fluid and Coordinated: Yes Fine  Motor Movements are Fluid and Coordinated: Yes Coordination and Movement Description: mild dysmetria R side Heel Shin Test: mild dysmetria RLE>LLE Motor  Motor Motor: Hemiplegia Motor - Skilled Clinical Observations: mild R hemi Motor - Discharge Observations: mild R sided Hemiplegia/dysmetria. improved from Eval.  Mobility Bed Mobility Bed Mobility: Supine to Sit Supine to Sit: Independent Sit to Supine: Independent Transfers Transfers: Sit to Stand;Stand to Sit;Stand Pivot Transfers Sit to Stand: Independent Stand to Sit: Independent Stand Pivot Transfers: Independent Transfer (Assistive device): None Locomotion  Gait Ambulation: Yes Gait Assistance: Independent Gait Distance (Feet): 250 Feet Assistive device: None Gait Gait: Yes Gait Pattern: Impaired Gait Pattern: Narrow base of support;Trunk flexed;Shuffle Stairs / Additional Locomotion Stairs: Yes Stairs Assistance: Independent with assistive device Stair Management Technique: Two rails Number of Stairs: 12 Height of Stairs: 6 Wheelchair Mobility Wheelchair Mobility: No  Trunk/Postural Assessment  Cervical Assessment Cervical Assessment: Exceptions to [facility] (forward head) Thoracic Assessment Thoracic Assessment: Within Functional Limits Lumbar Assessment Lumbar Assessment: Exceptions to [facility] (posterior pelvic tilt) Postural Control Postural Control: Within Functional Limits  Balance Balance Balance Assessed: Yes Standardized Balance Assessment Standardized Balance Assessment: Berg Balance Test Berg Balance Test Sit to Stand: Able to stand without using hands and stabilize independently Standing Unsupported: Able to stand safely 2 minutes Sitting with Back Unsupported but Feet Supported on Floor or Stool: Able to sit safely and securely  2 minutes Stand to Sit: Controls descent by using hands Transfers: Able to transfer safely, definite need of hands Standing Unsupported with Eyes Closed: Able to stand 10 seconds safely Standing Ubsupported with Feet Together: Able to place feet together independently and stand for 1 minute with supervision From Standing, Reach Forward with Outstretched Arm: Can reach forward >12 cm safely (5") From Standing Position, Pick up Object from Floor: Able to pick up shoe safely and easily From Standing Position, Turn to Look Behind Over each Shoulder: Looks behind from both sides and weight shifts well Turn 360 Degrees: Able to turn 360 degrees safely one side only in 4 seconds or less Standing Unsupported, Alternately Place Feet on Step/Stool: Able to stand independently and safely and complete 8 steps in 20 seconds Standing Unsupported, One Foot in Front: Able to take small step independently and hold 30 seconds Standing on One Leg: Able to lift leg independently and hold equal to or more than 3 seconds Total Score: 47 Static Sitting Balance Static Sitting - Balance Support: Feet supported Static Sitting - Level of Assistance: 7: Independent Dynamic Sitting Balance Dynamic Sitting - Balance Support: Feet supported Dynamic Sitting - Level of Assistance: 7: Independent Static Standing Balance Static Standing - Balance Support: During functional activity;No upper extremity supported Static Standing - Level of Assistance: 7: Independent Dynamic Standing Balance Dynamic Standing - Balance Support: No upper extremity supported;During functional activity Dynamic Standing - Level of Assistance: 7: Independent Functional Gait  Assessment Gait assessed : Yes Gait Level Surface: Walks 20 ft in less than 5.5 sec, no assistive devices, good speed, no evidence for imbalance, normal gait pattern, deviates no more than 6 in outside of the 12 in walkway width. Change in Gait Speed: Able to smoothly change  walking speed without loss of balance or gait deviation. Deviate no more than 6 in outside of the 12 in walkway width. Gait with Horizontal Head Turns: Performs head turns smoothly with no change in gait. Deviates no more than 6 in outside 12 in walkway width Gait with Vertical Head Turns: Performs task with slight change  in gait velocity (eg, minor disruption to smooth gait path), deviates 6 - 10 in outside 12 in walkway width or uses assistive device Gait and Pivot Turn: Pivot turns safely within 3 sec and stops quickly with no loss of balance. Step Over Obstacle: Is able to step over 2 stacked shoe boxes taped together (9 in total height) without changing gait speed. No evidence of imbalance. Gait with Narrow Base of Support: Ambulates less than 4 steps heel to toe or cannot perform without assistance. Gait with Eyes Closed: Walks 20 ft, no assistive devices, good speed, no evidence of imbalance, normal gait pattern, deviates no more than 6 in outside 12 in walkway width. Ambulates 20 ft in less than 7 sec. Ambulating Backwards: Walks 20 ft, uses assistive device, slower speed, mild gait deviations, deviates 6-10 in outside 12 in walkway width. Steps: Alternating feet, must use rail. Total Score: 24 Extremity Assessment  RUE Assessment RUE Assessment: Within Functional Limits General Strength Comments: Demonstrates functional strength during tasks performed today. Improved from evaluation. Very mild weakness noted. LUE Assessment LUE Assessment: Within Functional Limits RLE Assessment RLE Assessment: Within Functional Limits General Strength Comments: grossly 5/5 LLE Assessment LLE Assessment: Within Functional Limits General Strength Comments: grossly 5/5    Lorie Phenix 09/30/2021, 4:28 PM

## 2021-09-30 NOTE — Progress Notes (Signed)
PROGRESS NOTE   Subjective/Complaints:  No DOE unless >500' ambulation , no orthopnea  ROS- neg CP, SOB, N/V/D, fever, chills, HA, Cough. Objective:   No results found. No results for input(s): "WBC", "HGB", "HCT", "PLT" in the last 72 hours.  No results for input(s): "NA", "K", "CL", "CO2", "GLUCOSE", "BUN", "CREATININE", "CALCIUM" in the last 72 hours.   Intake/Output Summary (Last 24 hours) at 09/30/2021 0745 Last data filed at 09/30/2021 7412 Gross per 24 hour  Intake 798 ml  Output 1825 ml  Net -1027 ml      Pressure Injury 09/19/21 Sacrum Medial Unstageable - Full thickness tissue loss in which the base of the injury is covered by slough (yellow, tan, gray, green or brown) and/or eschar (tan, brown or black) in the wound bed. progressed to an unstageable pr (Active)  09/19/21 1940  Location: Sacrum  Location Orientation: Medial  Staging: Unstageable - Full thickness tissue loss in which the base of the injury is covered by slough (yellow, tan, gray, green or brown) and/or eschar (tan, brown or black) in the wound bed.  Wound Description (Comments): progressed to an unstageable pressure wound; 09/27/21, noted yellow slough in center of wound. Pic added to EPIC  Present on Admission:     Physical Exam: Vital Signs Blood pressure 122/72, pulse (!) 57, temperature 98.2 F (36.8 C), resp. rate 18, height 6' (1.829 m), weight 65.7 kg, SpO2 95 %.   General: No acute distress, in bed Mood and affect are appropriate, pleasant Heart: Regular rate and rhythm no rubs murmurs or extra sounds Lungs: Clear to auscultation, breathing unlabored, Left basilar rales , no wheezes Abdomen: Positive bowel sounds, soft nontender to palpation, nondistended Extremities: No clubbing, cyanosis, or edema Skin: No evidence of breakdown, no evidence of rash. Warm and dry. Neurologic: Cranial nerves II through XII intact, motor strength is  5/5 in left 4/5 right deltoid, bicep, tricep, grip, hip flexor, knee extensors, ankle dorsiflexor and plantar flexor Mild fine motor def Right finger to thumb  Musculoskeletal: Full range of motion in all 4 extremities. No joint swelling   Assessment/Plan: 1. Functional deficits which require 3+ hours per day of interdisciplinary therapy in a comprehensive inpatient rehab setting. Physiatrist is providing close team supervision and 24 hour management of active medical problems listed below. Physiatrist and rehab team continue to assess barriers to discharge/monitor patient progress toward functional and medical goals  Care Tool:  Bathing    Body parts bathed by patient: Face, Right arm, Left arm, Chest, Abdomen, Front perineal area, Buttocks, Right upper leg, Left upper leg, Right lower leg, Left lower leg         Bathing assist Assist Level: Contact Guard/Touching assist     Upper Body Dressing/Undressing Upper body dressing   What is the patient wearing?: Pull over shirt    Upper body assist Assist Level: Supervision/Verbal cueing    Lower Body Dressing/Undressing Lower body dressing      What is the patient wearing?: Incontinence brief     Lower body assist Assist for lower body dressing: Moderate Assistance - Patient 50 - 74%     Toileting Toileting    Toileting assist  Assist for toileting: Moderate Assistance - Patient 50 - 74% Assistive Device Comment: walker   Transfers Chair/bed transfer  Transfers assist     Chair/bed transfer assist level: Minimal Assistance - Patient > 75%     Locomotion Ambulation   Ambulation assist      Assist level: Minimal Assistance - Patient > 75% Assistive device: No Device Max distance: 161ft   Walk 10 feet activity   Assist     Assist level: Minimal Assistance - Patient > 75% Assistive device: No Device   Walk 50 feet activity   Assist    Assist level: Minimal Assistance - Patient > 75% Assistive  device: No Device    Walk 150 feet activity   Assist Walk 150 feet activity did not occur: Safety/medical concerns (fatigue)         Walk 10 feet on uneven surface  activity   Assist Walk 10 feet on uneven surfaces activity did not occur: Safety/medical concerns         Wheelchair     Assist Is the patient using a wheelchair?: No             Wheelchair 50 feet with 2 turns activity    Assist            Wheelchair 150 feet activity     Assist          Blood pressure 122/72, pulse (!) 57, temperature 98.2 F (36.8 C), resp. rate 18, height 6' (1.829 m), weight 65.7 kg, SpO2 95 %.  Medical Problem List and Plan: 1. 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             -patient may shower             -ELOS/Goals: 10-12 days,    CIR PT, OT SLP 2.  Antithrombotics: -DVT/anticoagulation:  TED hose -on Eliquis             -antiplatelet therapy: none 3. Pain Management: Tylenol prn 4. Mood/Sleep: LCSW to evaluate and provide emotional support, insomnia has prn trazodone but does not request will schedule it              -antipsychotic agents: n/a  -6/26 improved sleep last night 5. Neuropsych/cognition: This patient is capable of making decisions on his own behalf. 6. Skin/Wound Care: Routine skin care checks 7. Fluids/Electrolytes/Nutrition: Routine I's and O's and follow-up chemistries             --dysphagia 3 diet/thins 8: Respiratory failure/aspiration pneumonia: extubated 6/17- repeat CXR c/w pulm edema              -Denies shortness of breath, on room air 9: Chronic atrial fibrillation: continue Eliquis, Coreg             -- follows with Dr. 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% continue Coreg, on Lasix  40mg  qam at home held due to AKI will resume low dose given CXR result and hypoxia Resumed at 10mg  per day, increase to BID 6/25  has left basilar rales               -daily weight, good output on BID lasix, daily net - yesterday  11: Ileus: Resolved, tolerating dysphagia 3 diet 12: Diabetes mellitus:  continue SSI q ACHS; CBG monitoring, HGB A1C 5.9 on 6/14 13: Acute kidney injury atop chronic kidney disease stage III: Follow-up BMP --BUN and creatinine normal 6/19 14: 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 Quit 26yrs ago, pt is poor historian 66: Constipation             --Has been on Miralax and senokot, Consider additional laxatives  -6/26 last BM yesterday-improved 20: Urinary incontinence: has external urinary catheter 21. Dysphagia Dys 3/thin diet             -Continue SLP  22.  THrombocytopenia improving last PLT 101K, cont to monitor , received heparin for intravascular procedure , cont to monitor no signs of bleeding -6/26 Platelets 201- resolved 23.  Infiltrate vs effusion , WBC mildly elevated but no fever, pt feels ok, hypoxia at noc, on furosemide 40mg  per day at home , held due to AKI will resume low dose and monitor BMET , if increasing SOB or pulm edema will need to increase dose  UO improved losing fluid weight , LLL crackles improved    LOS: 8 days A FACE TO FACE EVALUATION WAS PERFORMED  Charlett Blake 09/30/2021, 7:45 AM

## 2021-09-30 NOTE — Progress Notes (Signed)
Speech Language Pathology Discharge Summary  Patient Details  Name: Eugene Campbell MRN: [id number] Date of Birth: 10/30/1940  Today's Date: 09/30/2021 SLP Individual Time: 1400-1457 SLP Individual Time Calculation (min): 57 min  Skilled Therapeutic Interventions: Skilled ST treatment focused on family education. SLP provided education on current diet/liquid recommendations, general aspiration precautions, crushed medications, cognitive-communication changes s/p CVA, strategies to maximize attention and memory, supervision with iADLs including medication management, and consideration of outpatient SLP services to further address cognitive-communication changes to further increase functional independence and safety. Family inquired about crushed medications. SLP educated that crushed medications were initially needed from an oral standpoint, as pt exhibited difficulty orally clearing whole pill and pill remained on tongue despite attempts. SLP/pt/family agreeable to trial whole pills given one-at-a-time with applesauce during next med pass. SLP requested nurse to trial this evening - nurse verbalized agreement. Family also had additional questions for PA - messaged via secure chat. PA arrived at end of SLP session to address questions. Patient was left in recliner with alarm activated and immediate needs within reach at end of session.   Patient has met 5 of 5 long term goals.  Patient to discharge at overall Supervision level.   Reasons goals not met: All goals met   Clinical Impression/Discharge Summary: Patient has made excellent gains and has met 5 of 5 long-term goals this admission due to improved problem solving skills, awareness, functional recall with use of strategies, and tolerance of current diet. Pt can communicate functional needs and engage in simple and complex conversation without evidence of word finding difficulty. Pt is currently consuming a dysphagia 3 diet and thin liquids with  minimal s/s of aspiration and intermittent supervision. Recommend crushed medications vs. whole in applesauce and given one at a time. Patient and family education is complete and patient to discharge at overall supervision level. Patient's care partner is independent to provide the necessary physical and cognitive assistance at discharge. Patient would benefit from continued SLP services in outpatient setting to maximize cognitive-communication function, maximize swallowing safety, and reduce caregiver burden. and functional independence.   Care Partner:  Caregiver Able to Provide Assistance: Yes  Type of Caregiver Assistance: Physical;Cognitive  Recommendation:  24 hour supervision/assistance;Outpatient SLP  Rationale for SLP Follow Up: Maximize cognitive function and independence;Maximize swallowing safety;Reduce caregiver burden   Equipment: Provided pt with pill crusher and memory notebook   Reasons for discharge: Discharged from hospital;Treatment goals met   Patient/Family Agrees with Progress Made and Goals Achieved: Yes    Brianne T Garretson 09/30/2021, 3:58 PM  

## 2021-09-30 NOTE — Progress Notes (Signed)
Physical Therapy Session Note  Patient Details  Name: Eugene Campbell MRN: [id number] Date of Birth: 04-02-1941  Today's Date: 09/30/2021 PT Individual Time: 1030-1055 PT Individual Time Calculation (min): 25 min   Short Term Goals: Week 1:  PT Short Term Goal 1 (Week 1): Pt will complete bed mobility with supervision PT Short Term Goal 2 (Week 1): Pt will complete bed<>chair transfers with CGA and LRAD PT Short Term Goal 3 (Week 1): Pt will ambulate 162ft with CGA and LRAD PT Short Term Goal 4 (Week 1): Pt will improve BERG balance test by at least 7 points  Skilled Therapeutic Interventions/Progress Updates:   Received pt semi-reclined in bed, pt agreeable to PT treatment, and denied any pain during session. Pt expressing confidence with D/C home tomorrow - briefly reviewed energy conservation strategies and importance of rest breaks prior to onset of fatigue/SOB. Session with emphasis on functional mobility/transfers, generalized strengthening and endurance, and gait training. Pt transferred semi-reclined<>sitting EOB with HOB elevated and mod I. Donned shoes with set up assist and performed all transfers without AD and supervision. Pt ambulated 167ft x 2 trials without AD and close supervision to/from dayroom. Pt performed seated BUE/LE strengthening on Nustep at workload 5 for 8 minutes for a total of 475 steps with emphasis on cardiovascular endurance with 1 rest break - O2 sats 95% during activity. Returned to room and concluded session with pt sitting in recliner, needs within reach, and chair pad alarm on.   Therapy Documentation Precautions:  Precautions Precautions: Fall Precaution Comments: monitor O2, HR, mild R hemi Restrictions Weight Bearing Restrictions: No  Therapy/Group: Individual Therapy Martin Majestic PT, DPT  09/30/2021, 7:12 AM

## 2021-09-30 NOTE — Plan of Care (Signed)
  Problem: RH Swallowing Goal: LTG Patient will consume least restrictive diet using compensatory strategies with assistance (SLP) Description: LTG:  Patient will consume least restrictive diet using compensatory strategies with assistance (SLP) Outcome: Completed/Met   Problem: RH Problem Solving Goal: LTG Patient will demonstrate problem solving for (SLP) Description: LTG:  Patient will demonstrate problem solving for basic/complex daily situations with cues  (SLP) Outcome: Completed/Met   Problem: RH Memory Goal: LTG Patient will demonstrate ability for day to day (SLP) Description: LTG:   Patient will demonstrate ability for day to day recall/carryover during cognitive/linguistic activities with assist  (SLP) Outcome: Completed/Met Goal: LTG Patient will use memory compensatory aids to (SLP) Description: LTG:  Patient will use memory compensatory aids to recall biographical/new, daily complex information with cues (SLP) Outcome: Completed/Met   Problem: RH Awareness Goal: LTG: Patient will demonstrate awareness during functional activites type of (SLP) Description: LTG: Patient will demonstrate awareness during functional activites type of (SLP) Outcome: Completed/Met   Problem: RH Expression Communication Goal: LTG Patient will increase word finding of common (SLP) Description: LTG:  Patient will increase word finding of common objects/daily info/abstract thoughts with cues using compensatory strategies (SLP). Outcome: Not Applicable Note: Not formally addressed during treatment.

## 2021-09-30 NOTE — Plan of Care (Signed)
  Problem: RH Balance Goal: LTG Patient will maintain dynamic standing with ADLs (OT) Description: LTG:  Patient will maintain dynamic standing balance with assist during activities of daily living (OT)  Outcome: Completed/Met   Problem: Sit to Stand Goal: LTG:  Patient will perform sit to stand in prep for activites of daily living with assistance level (OT) Description: LTG:  Patient will perform sit to stand in prep for activites of daily living with assistance level (OT) Outcome: Completed/Met   Problem: RH Eating Goal: LTG Patient will perform eating w/assist, cues/equip (OT) Description: LTG: Patient will perform eating with assist, with/without cues using equipment (OT) Outcome: Completed/Met   Problem: RH Grooming Goal: LTG Patient will perform grooming w/assist,cues/equip (OT) Description: LTG: Patient will perform grooming with assist, with/without cues using equipment (OT) Outcome: Completed/Met   Problem: RH Bathing Goal: LTG Patient will bathe all body parts with assist levels (OT) Description: LTG: Patient will bathe all body parts with assist levels (OT) Outcome: Completed/Met   Problem: RH Dressing Goal: LTG Patient will perform upper body dressing (OT) Description: LTG Patient will perform upper body dressing with assist, with/without cues (OT). Outcome: Completed/Met Goal: LTG Patient will perform lower body dressing w/assist (OT) Description: LTG: Patient will perform lower body dressing with assist, with/without cues in positioning using equipment (OT) Outcome: Completed/Met   Problem: RH Toileting Goal: LTG Patient will perform toileting task (3/3 steps) with assistance level (OT) Description: LTG: Patient will perform toileting task (3/3 steps) with assistance level (OT)  Outcome: Completed/Met   Problem: RH Functional Use of Upper Extremity Goal: LTG Patient will use RT/LT upper extremity as a (OT) Description: LTG: Patient will use right/left upper  extremity as a stabilizer/gross assist/diminished/nondominant/dominant level with assist, with/without cues during functional activity (OT) Outcome: Completed/Met   Problem: RH Simple Meal Prep Goal: LTG Patient will perform simple meal prep w/assist (OT) Description: LTG: Patient will perform simple meal prep with assistance, with/without cues (OT). Outcome: Completed/Met   Problem: RH Light Housekeeping Goal: LTG Patient will perform light housekeeping w/assist (OT) Description: LTG: Patient will perform light housekeeping with assistance, with/without cues (OT). Outcome: Completed/Met   Problem: RH Toilet Transfers Goal: LTG Patient will perform toilet transfers w/assist (OT) Description: LTG: Patient will perform toilet transfers with assist, with/without cues using equipment (OT) Outcome: Completed/Met   Problem: RH Tub/Shower Transfers Goal: LTG Patient will perform tub/shower transfers w/assist (OT) Description: LTG: Patient will perform tub/shower transfers with assist, with/without cues using equipment (OT) Outcome: Completed/Met   Problem: RH Furniture Transfers Goal: LTG Patient will perform furniture transfers w/assist (OT/PT) Description: LTG: Patient will perform furniture transfers  with assistance (OT/PT). Outcome: Completed/Met

## 2021-10-01 MED ORDER — ATORVASTATIN CALCIUM 40 MG PO TABS: 40.0000 mg | ORAL_TABLET | Freq: Every day | ORAL | 0 refills | Status: DC

## 2021-10-01 MED ORDER — LIVING WELL WITH DIABETES BOOK
Freq: Once | Status: DC
  Filled 2021-10-01: qty 1

## 2021-10-01 MED ORDER — MEDIHONEY WOUND/BURN DRESSING EX PSTE: 1.0000 | PASTE | Freq: Every day | CUTANEOUS | 0 refills | Status: AC

## 2021-10-01 MED ORDER — SENNOSIDES-DOCUSATE SODIUM 8.6-50 MG PO TABS: 1.0000 | ORAL_TABLET | Freq: Two times a day (BID) | ORAL | Status: AC

## 2021-10-01 MED ORDER — POLYETHYLENE GLYCOL 3350 17 G PO PACK: 17.0000 g | PACK | Freq: Every day | ORAL | 0 refills | Status: AC

## 2021-10-01 MED ORDER — TRAZODONE HCL 50 MG PO TABS: 50.0000 mg | ORAL_TABLET | Freq: Every day | ORAL | 0 refills | Status: AC

## 2021-10-01 MED ORDER — BLOOD PRESSURE CONTROL BOOK
Freq: Once | Status: DC
  Filled 2021-10-01: qty 1

## 2021-10-01 MED ORDER — FUROSEMIDE 20 MG PO TABS: 10.0000 mg | ORAL_TABLET | Freq: Two times a day (BID) | ORAL | 0 refills | Status: AC

## 2021-10-01 MED ORDER — CARVEDILOL 3.125 MG PO TABS: 3.1250 mg | ORAL_TABLET | Freq: Two times a day (BID) | ORAL | 0 refills | Status: AC

## 2021-10-01 MED ORDER — ACETAMINOPHEN 325 MG PO TABS: 325.0000 mg | ORAL_TABLET | ORAL | Status: AC | PRN

## 2021-10-01 NOTE — Progress Notes (Signed)
Patient being DC home today. Patients step daughter picking  up patient. Step daughter educated on wound care on patient buttock. Patient nor step daughter has any questions or concerns regarding care to buttock. No other questions or concerns at this time.

## 2021-10-01 NOTE — Plan of Care (Signed)

## 2021-10-20 ENCOUNTER — Encounter: Payer: Medicare HMO | Admitting: Speech Pathology

## 2021-10-20 ENCOUNTER — Ambulatory Visit: Payer: Medicare HMO | Admitting: Physical Therapy

## 2021-10-20 ENCOUNTER — Ambulatory Visit: Payer: Medicare HMO | Admitting: Occupational Therapy

## 2021-11-08 NOTE — Progress Notes (Unsigned)
[facility] [address] [place]. [place] [address] [phone]       HOSPITAL FOLLOW UP NOTE  [name] Date of Birth:  12/11/1940 Medical Record Number:  [id number]   Reason for Referral:  hospital stroke follow up    SUBJECTIVE:   CHIEF COMPLAINT:  No chief complaint on file.   HPI:   [name] is a 81 y.o. male with history of CHF, A-fib on Eliquis CKD, hypertension, diabetes, CHF, PE in 2022, chronic DVT, aortic stenosis status post AVR who presented on 09/15/2021 with aphasia, right-sided weakness, right facial droop.  Personally reviewed hospitalization pertinent progress notes, lab work and imaging.  Stroke workup revealed left MCA infarcts (L BG, CR and temporal lobe) due to left M1 occlusion s/p IR with TICI 3 reperfusion, embolic pattern secondary to A-fib and cardiomyopathy with low EF at 20 to 25% despite being on Eliquis.  LDL 66.  A1c 5.9.  Recommend restart of Eliquis (possibly taking once daily vs BID) and initiated atorvastatin 40 mg daily.  Advised to follow-up outpatient with cardiology for chronic A-fib on Eliquis and cardiomyopathy.  Hospital course complicated by large amount of emesis 2/2 ileus with massive aspiration, hypotension and desaturation on 6/15, tx'd with Rocephin and extubated 6/17.  Eventually passed swallowing eval and placed on dysphagia 3 diet.  Due to residual deficits, discharged to CIR on 6/21 per therapy recommendations.  Today, 11/09/2021, patient is being seen for initial hospital follow-up.  He was discharged home from [facility] on 6/30.  Initially recovering well with near complete resolution of right-sided weakness with some mild fatigue.  Reported residual mild dysphagia on mechanical soft diet.  Denied any speech or word finding difficulty.  Unfortunately, he was hospitalized 7/12 with weakness, chills, fever and hypotension with possible sepsis in setting of aspiration pneumonia.  TEE showed possible 2 small  areas of vegetation on prosthetic valve, ID recommended 6-week course of antibiotics via PICC line.   Compliant on Eliquis and atorvastatin, denies side effects Blood pressure today ***       PERTINENT IMAGING  Per hospitalization 09/15/2021 -09/22/2021 CT left insular subacute infarct, chronic left MCA frontal infarct, left M1 hyperdense sign CT head and neck left M1 occlusion, right ICA 50 to 60% stenosis, left ICA 40% stenosis, right VA origin and bilateral siphon moderate to severe stenosis. IR with left M1 occlusion s/p TICI3 reperfusion MRI left BG, CR and mesial temporal lobe small infarcts MRA left M1 occlusion recannulized, patent left MCA.  Stable intracranial atherosclerosis and stenosis most notably both ICA siphon, right MCA bifurcation and distal right VA 2D Echo EF 20 to 25% LDL 66 HgbA1c 5.9    ROS:   14 system review of systems performed and negative with exception of ***  PMH:  Past Medical History:  Diagnosis Date   Aortic stenosis    Chronic renal impairment, stage 3 (moderate), unspecified whether stage 3a or 3b CKD ([name])    Diabetes ([name])    HFrEF (heart failure with reduced ejection fraction) ([name])    Hypothyroidism    PE (pulmonary thromboembolism) ([name]) 2022    PSH:  Past Surgical History:  Procedure Laterality Date   AORTIC VALVE REPLACEMENT     CHOLECYSTECTOMY     IR CT HEAD LTD  09/15/2021   IR PERCUTANEOUS ART THROMBECTOMY/INFUSION INTRACRANIAL INC DIAG ANGIO  09/15/2021   IR US GUIDE VASC ACCESS RIGHT  09/15/2021   RADIOLOGY WITH ANESTHESIA N/A 09/15/2021   Procedure: IR  WITH ANESTHESIA;  Surgeon: Radiologist, Medication, MD;  Location: [facility];  Service: Radiology;  Laterality: N/A;    Social History:  Social History   Socioeconomic History   Marital status: Married    Spouse name: Not on file   Number of children: Not on file   Years of education: Not on file   Highest education level: Not on file  Occupational History   Not on file   Tobacco Use   Smoking status: Former    Packs/day: 1.00    Years: 13.00    Total pack years: 13.00    Types: Cigarettes    Quit date: 61    Years since quitting: 50.6   Smokeless tobacco: Not on file  Substance and Sexual Activity   Alcohol use: Never   Drug use: Not on file   Sexual activity: Not on file  Other Topics Concern   Not on file  Social History Narrative   Not on file   Social Determinants of Health   Financial Resource Strain: Not on file  Food Insecurity: Not on file  Transportation Needs: Not on file  Physical Activity: Not on file  Stress: Not on file  Social Connections: Not on file  Intimate Partner Violence: Not on file    Family History: No family history on file.  Medications:   Current Outpatient Medications on File Prior to Visit  Medication Sig Dispense Refill   acetaminophen (TYLENOL) 325 MG tablet Take 1-2 tablets (325-650 mg total) by mouth every 4 (four) hours as needed for mild pain.     atorvastatin (LIPITOR) 40 MG tablet Take 1 tablet (40 mg total) by mouth daily. 30 tablet 0   B Complex Vitamins (VITAMIN B COMPLEX) TABS Take 1 tablet by mouth daily with breakfast.     carvedilol (COREG) 3.125 MG tablet Take 1 tablet (3.125 mg total) by mouth 2 (two) times daily with a meal. 60 tablet 0   Cholecalciferol (VITAMIN D3) 25 MCG (1000 UT) CAPS Take 1,000 Units by mouth daily.     Coenzyme Q10 (COQ-10 PO) Take 1 capsule by mouth daily.     Cyanocobalamin (VITAMIN B-12) 1000 MCG SUBL Place 1,000 mcg under the tongue daily.     ELIQUIS 5 MG TABS tablet Take 5 mg by mouth 2 (two) times daily.     ferrous sulfate 325 (65 FE) MG tablet Take 325 mg by mouth daily with breakfast.     furosemide (LASIX) 20 MG tablet Take 0.5 tablets (10 mg total) by mouth 2 (two) times daily. 60 tablet 0   leptospermum manuka honey (MEDIHONEY) PSTE paste Apply 1 Application topically daily. 15 mL 0   levothyroxine (SYNTHROID) 75 MCG tablet Take 75 mcg by mouth See  admin instructions. Take 75 mcg by mouth before breakfast on Mon/Tues/Wed/Thurs/Fri     polyethylene glycol (MIRALAX / GLYCOLAX) 17 g packet Take 17 g by mouth daily. 14 each 0   senna-docusate (SENOKOT-S) 8.6-50 MG tablet Take 1 tablet by mouth 2 (two) times daily.     traZODone (DESYREL) 50 MG tablet Take 1 tablet (50 mg total) by mouth at bedtime. 30 tablet 0   No current facility-administered medications on file prior to visit.    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 There is no height or weight on file to calculate BMI. No results found.      No data to display           General: well developed, well nourished, seated, in no evident distress Head: head normocephalic and atraumatic.   Neck: supple with no carotid or supraclavicular bruits Cardiovascular: regular rate and rhythm, no murmurs Musculoskeletal: no deformity Skin:  no rash/petichiae Vascular:  Normal pulses all extremities   Neurologic Exam Mental Status: Awake and fully alert. Oriented to place and time. Recent and remote memory intact. Attention span, concentration and fund of knowledge appropriate. Mood and affect appropriate.  Cranial Nerves: Fundoscopic exam reveals sharp disc margins. Pupils equal, briskly reactive to light. Extraocular movements full without nystagmus. Visual fields full to confrontation. Hearing intact. Facial sensation intact. Face, tongue, palate moves normally and symmetrically.  Motor: Normal bulk and tone. Normal strength in all tested extremity muscles Sensory.: intact to touch , pinprick , position and vibratory sensation.  Coordination: Rapid alternating movements normal in all  extremities. Finger-to-nose and heel-to-shin performed accurately bilaterally. Gait and Station: Arises from chair without difficulty. Stance is normal. Gait demonstrates normal stride length and balance with ***. Tandem walk and heel toe ***.  Reflexes: 1+ and symmetric. Toes downgoing.     NIHSS  *** Modified Rankin  ***      ASSESSMENT: Eugene Campbell is a 81 y.o. year old male with left MCA strokes on 09/15/2021 in setting of left M1 occlusion s/p IR with TICI 3 reperfusion, embolic pattern secondary to A-fib and cardiomyopathy with low EF on Eliquis. Vascular risk factors include A-fib on Eliquis, CHF, HTN, HLD, DM, history of DVT/PE, cardiomyopathy with EF 20 to 25%, advanced age and AS s/p bioprosthetic aortic valve.      PLAN:  Left MCA strokes:  Residual deficit: ***.  Continue  Eliquis 5 mg twice daily   and atorvastatin 40 mg daily for secondary stroke prevention.   Discussed secondary stroke prevention measures and importance of close PCP follow up for aggressive stroke risk factor management including BP goal<130/90, HLD with LDL goal<70 and DM with A1c.<7 .  Stroke labs ***: LDL ***, A1c *** I have gone over the pathophysiology of stroke, warning signs and symptoms, risk factors and their management in some detail with instructions to go to the closest emergency room for symptoms of concern.     Follow up in *** or call earlier if needed   CC:  [facility] provider: Dr. Pearlean Brownie PCP: Susann Givens, PA-C    I spent *** minutes of face-to-face and non-face-to-face time with patient.  This included previsit chart review including review of recent hospitalization, lab review, study review, order entry, electronic health record documentation, patient education regarding recent stroke including etiology, secondary stroke prevention measures and importance of managing stroke risk factors, residual deficits and typical recovery time and answered all other questions to patient  satisfaction   Ihor Austin, AGNP-BC  [facility] [address] [place], [place] [address]  Phone [phone] Fax [phone] Note: This document was prepared with digital dictation and possible smart phrase technology. Any transcriptional errors that result from this process are unintentional.

## 2021-11-09 ENCOUNTER — Ambulatory Visit: Payer: Medicare HMO | Admitting: Adult Health

## 2021-11-09 ENCOUNTER — Encounter: Payer: Self-pay | Admitting: Adult Health

## 2021-11-09 VITALS — BP 99/57 | HR 66 | Ht 72.0 in | Wt 135.1 lb

## 2021-11-09 DIAGNOSIS — G8929 Other chronic pain: Secondary | ICD-10-CM

## 2021-11-09 DIAGNOSIS — I63412 Cerebral infarction due to embolism of left middle cerebral artery: Secondary | ICD-10-CM

## 2021-11-09 DIAGNOSIS — M25511 Pain in right shoulder: Secondary | ICD-10-CM

## 2021-11-09 DIAGNOSIS — Z09 Encounter for follow-up examination after completed treatment for conditions other than malignant neoplasm: Secondary | ICD-10-CM | POA: Diagnosis not present

## 2021-11-09 NOTE — Patient Instructions (Signed)
Start PT and OT once able, continue working with speech therapy - please let me know if you need assistance with getting a swallowing study completed   Continue  Eliquis 5mg  twice daily    for secondary stroke prevention  Continue to follow up with PCP regarding blood pressure and cholesterol management  Maintain strict control of hypertension with blood pressure goal below 130/90 and cholesterol with LDL cholesterol (bad cholesterol) goal below 70 mg/dL.   Signs of a Stroke? Follow the BEFAST method:  Balance Watch for a sudden loss of balance, trouble with coordination or vertigo Eyes Is there a sudden loss of vision in one or both eyes? Or double vision?  Face: Ask the person to smile. Does one side of the face droop or is it numb?  Arms: Ask the person to raise both arms. Does one arm drift downward? Is there weakness or numbness of a leg? Speech: Ask the person to repeat a simple phrase. Does the speech sound slurred/strange? Is the person confused ? Time: If you observe any of these signs, call 911.    Followup in the future with me in 4 months or call earlier if needed       Thank you for coming to see at [facility]. I hope we have been able to provide you high quality care today.  You may receive a patient satisfaction survey over the next few weeks. We would appreciate your feedback and comments so that we may continue to improve ourselves and the health of our patients.     Stroke Prevention Some medical conditions and lifestyle choices can lead to a higher risk for a stroke. You can help to prevent a stroke by eating healthy foods and exercising. It also helps to not smoke and to manage any health problems you may have. How can this condition affect me? A stroke is an emergency. It should be treated right away. A stroke can lead to brain damage or threaten your life. There is a better chance of surviving and getting better after a stroke if you get  medical help right away. What can increase my risk? The following medical conditions may increase your risk of a stroke: Diseases of the heart and blood vessels (cardiovascular disease). High blood pressure (hypertension). Diabetes. High cholesterol. Sickle cell disease. Problems with blood clotting. Being very overweight. Sleeping problems (obstructivesleep apnea). Other risk factors include: Being older than age 60. A history of blood clots, stroke, or mini-stroke (TIA). Race, ethnic background, or a family history of stroke. Smoking or using tobacco products. Taking birth control pills, especially if you smoke. Heavy alcohol and drug use. Not being active. What actions can I take to prevent this? Manage your health conditions High cholesterol. Eat a healthy diet. If this is not enough to manage your cholesterol, you may need to take medicines. Take medicines as told by your doctor. High blood pressure. Try to keep your blood pressure below 130/80. If your blood pressure cannot be managed through a healthy diet and regular exercise, you may need to take medicines. Take medicines as told by your doctor. Ask your doctor if you should check your blood pressure at home. Have your blood pressure checked every year. Diabetes. Eat a healthy diet and get regular exercise. If your blood sugar (glucose) cannot be managed through diet and exercise, you may need to take medicines. Take medicines as told by your doctor. Talk to your doctor about getting checked for sleeping problems.  Signs of a problem can include: Snoring a lot. Feeling very tired. Make sure that you manage any other conditions you have. Nutrition  Follow instructions from your doctor about what to eat or drink. You may be told to: Eat and drink fewer calories each day. Limit how much salt (sodium) you use to 1,500 milligrams (mg) each day. Use only healthy fats for cooking, such as olive oil, canola oil, and  sunflower oil. Eat healthy foods. To do this: Choose foods that are high in fiber. These include whole grains, and fresh fruits and vegetables. Eat at least 5 servings of fruits and vegetables a day. Try to fill one-half of your plate with fruits and vegetables at each meal. Choose low-fat (lean) proteins. These include low-fat cuts of meat, chicken without skin, fish, tofu, beans, and nuts. Eat low-fat dairy products. Avoid foods that: Are high in salt. Have saturated fat. Have trans fat. Have cholesterol. Are processed or pre-made. Count how many carbohydrates you eat and drink each day. Lifestyle If you drink alcohol: Limit how much you have to: 0-1 drink a day for women who are not pregnant. 0-2 drinks a day for men. Know how much alcohol is in your drink. In the [place] one drink equals one 12 oz bottle of beer ( ), one 5 oz glass of wine ( ), or one 1 oz glass of hard liquor (64mL). Do not smoke or use any products that have nicotine or tobacco. If you need help quitting, ask your doctor. Avoid secondhand smoke. Do not use drugs. Activity  Try to stay at a healthy weight. Get at least 30 minutes of exercise on most days, such as: Fast walking. Biking. Swimming. Medicines Take over-the-counter and prescription medicines only as told by your doctor. Avoid taking birth control pills. Talk to your doctor about the risks of taking birth control pills if: You are over 38 years old. You smoke. You get very bad headaches. You have had a blood clot. Where to find more information American Stroke Association: www.strokeassociation.org Get help right away if: You or a loved one has any signs of a stroke. "BE FAST" is an easy way to remember the warning signs: B - Balance. Dizziness, sudden trouble walking, or loss of balance. E - Eyes. Trouble seeing or a change in how you see. F - Face. Sudden weakness or loss of feeling of the face. The face or eyelid may droop on one  side. A - Arms. Weakness or loss of feeling in an arm. This happens all of a sudden and most often on one side of the body. S - Speech. Sudden trouble speaking, slurred speech, or trouble understanding what people say. T - Time. Time to call emergency services. Write down what time symptoms started. You or a loved one has other signs of a stroke, such as: A sudden, very bad headache with no known cause. Feeling like you may vomit (nausea). Vomiting. A seizure. These symptoms may be an emergency. Get help right away. Call your local emergency services (911 in the [place] Do not wait to see if the symptoms will go away. Do not drive yourself to the hospital. Summary You can help to prevent a stroke by eating healthy, exercising, and not smoking. It also helps to manage any health problems you have. Do not smoke or use any products that contain nicotine or tobacco. Get help right away if you or a loved one has any signs of a stroke. This information is not intended  to replace advice given to you by your health care provider. Make sure you discuss any questions you have with your health care provider. Document Revised: 10/21/2019 Document Reviewed: 10/21/2019 Elsevier Patient Education  2023 [employer].

## 2021-11-23 ENCOUNTER — Encounter: Payer: Medicare HMO | Admitting: Physical Medicine & Rehabilitation

## 2021-12-30 ENCOUNTER — Other Ambulatory Visit: Payer: Self-pay

## 2021-12-30 NOTE — Patient Outreach (Addendum)
  Care Coordination   12/30/2021 Name: Eugene Campbell MRN: [id number] DOB: Oct 31, 1940    Telephone outreach to patient to obtain mRS was successfully completed. MRS= 2  [place] Care Management Assistant [phone]

## 2022-03-16 NOTE — Progress Notes (Deleted)
[facility] [address] [place] [place] [address] [phone]       HOSPITAL FOLLOW UP NOTE  Mr. Deaglan Lile Date of Birth:  11-Mar-1941 Medical Record Number:  [id number]   Reason for Referral:  hospital stroke follow up    SUBJECTIVE:   CHIEF COMPLAINT:  No chief complaint on file.   HPI:   Update 03/17/2022 JM: Patient returns for scheduled 58-month stroke follow-up.  Presented to ED 12/7 with fluctuation of left arm weakness/tingling.  Remained asymptomatic during while in ED.  CTA showed right greater than left ICA arthrosclerosis.  MRI not obtained as this would require admission and was recommended to follow-up outpatient to be completed.  Recommended adding baby aspirin daily as well as statin in addition to Eliquis dosage.  He was advised to follow-up with vascular surgery to consider endarterectomy.   Was seen by vascular surgery 12/11, was not felt to be candidate for surgical intervention due to CHF with low EF, advanced age and chronic A-fib where risk would outweigh benefit.  Recommend continued medical therapy.  Return to ED on 12/12 after a fall with generalized weakness and increased confusion.  Evidence of right hemiparesis and right visual field deficit on exam.  MRI brain showed acute/recent infarction of the left PLIC and left medial temporal lobe.         History provided for reference purposes only Initial visit 11/09/2021 JM: Patient is being seen for initial hospital follow-up accompanied by his daughter.  He was discharged home from [facility] on 6/30.  Initially recovering well with near complete resolution of right-sided weakness with some mild fatigue.  Reported residual mild dysphagia on mechanical soft diet.  Noted occasional word finding difficulty and difficulty reading/concentrating. Also noted mild short term memory difficulties. He was scheduled for initial therapy evaluations on 7/19.  Unfortunately, he was hospitalized 7/12  with weakness, chills, fever and hypotension with sepsis.  Initially felt to be in setting of aspiration pneumonia although per note, unclear if imaging was showing residual from prior hospitalization.  TEE showed possible 2 small areas of vegetation on prosthetic valve, ID recommended 6-week course of antibiotics via PICC line.  He was discharged home on 7/19.    Since discharge, he reports generalized weakness feeling.  He is also been experiencing nausea and GI upset which has been affecting his appetite.  He is closely followed by PCP and cardiology for issues of hypotension which he feels is also contributing.  He has follow-up visit with PCP tomorrow and with cardiology on Thursday.  He is scheduled to see ID on 8/14, he has 3 more weeks of abx.  He has since been seen by [facility] SLP who plans on setting up a barium swallow study per patient.  Continues to have mild swallowing difficulties, occasional word finding difficulty and cognitive difficulties - denies worsening since recent admission.  He has not yet started PT/OT as he still feels unwell. He does c/o right shoulder pain, hx of prior injury to right shoulder but has been painful more recently with decreased ROM.   Reports continued use of Eliquis 5 mg twice daily, denies side effects.  Not currently on atorvastatin as this was stopped at some point but daughter unsure when and if specific indication. Blood pressure today 99/57  No further concerns at this time.  Stroke admission 09/15/2021 Mr. Tylen Leverich is a 81 y.o. male with history of CHF, A-fib on Eliquis CKD, hypertension, diabetes, CHF, PE in 2022, chronic DVT,  aortic stenosis status post AVR who presented on 09/15/2021 with aphasia, right-sided weakness, right facial droop.  Personally reviewed hospitalization pertinent progress notes, lab work and imaging.  Stroke workup revealed left MCA infarcts (L BG, CR and temporal lobe) due to left M1 occlusion s/p IR with TICI 3 reperfusion,  embolic pattern secondary to A-fib and cardiomyopathy with low EF at 20 to 25% despite being on Eliquis.  LDL 66.  A1c 5.9.  Recommend restart of Eliquis (possibly taking once daily vs BID) and initiated atorvastatin 40 mg daily.  Advised to follow-up outpatient with cardiology for chronic A-fib on Eliquis and cardiomyopathy.  Hospital course complicated by large amount of emesis 2/2 ileus with massive aspiration, hypotension and desaturation on 6/15, tx'd with Rocephin and extubated 6/17.  Eventually passed swallowing eval and placed on dysphagia 3 diet.  Due to residual deficits, discharged to CIR on 6/21 per therapy recommendations.      PERTINENT IMAGING  Per hospitalization 09/15/2021 -09/22/2021 CT left insular subacute infarct, chronic left MCA frontal infarct, left M1 hyperdense sign CT head and neck left M1 occlusion, right ICA 50 to 60% stenosis, left ICA 40% stenosis, right VA origin and bilateral siphon moderate to severe stenosis. IR with left M1 occlusion s/p TICI3 reperfusion MRI left BG, CR and mesial temporal lobe small infarcts MRA left M1 occlusion recannulized, patent left MCA.  Stable intracranial atherosclerosis and stenosis most notably both ICA siphon, right MCA bifurcation and distal right VA 2D Echo EF 20 to 25% LDL 66 HgbA1c 5.9    ROS:   14 system review of systems performed and negative with exception of those listed in HPI  PMH:  Past Medical History:  Diagnosis Date   Aortic stenosis    Chronic renal impairment, stage 3 (moderate), unspecified whether stage 3a or 3b CKD ([name])    Diabetes ([name])    HFrEF (heart failure with reduced ejection fraction) ([name])    Hypothyroidism    PE (pulmonary thromboembolism) ([name]) 2022    PSH:  Past Surgical History:  Procedure Laterality Date   AORTIC VALVE REPLACEMENT     CHOLECYSTECTOMY     IR CT HEAD LTD  09/15/2021   IR PERCUTANEOUS ART THROMBECTOMY/INFUSION INTRACRANIAL INC DIAG ANGIO  09/15/2021   IR US GUIDE  VASC ACCESS RIGHT  09/15/2021   RADIOLOGY WITH ANESTHESIA N/A 09/15/2021   Procedure: IR WITH ANESTHESIA;  Surgeon: Radiologist, Medication, MD;  Location: [facility] OR;  Service: Radiology;  Laterality: N/A;    Social History:  Social History   Socioeconomic History   Marital status: Married    Spouse name: Not on file   Number of children: Not on file   Years of education: Not on file   Highest education level: Not on file  Occupational History   Not on file  Tobacco Use   Smoking status: Former    Packs/day: 1.00    Years: 13.00    Total pack years: 13.00    Types: Cigarettes    Quit date: 62    Years since quitting: 50.9   Smokeless tobacco: Not on file  Substance and Sexual Activity   Alcohol use: Never   Drug use: Not on file   Sexual activity: Not on file  Other Topics Concern   Not on file  Social History Narrative   Not on file   Social Determinants of Health   Financial Resource Strain: Not on file  Food Insecurity: Not on file  Transportation Needs: Not on file  Physical Activity: Not on file  Stress: Not on file  Social Connections: Not on file  Intimate Partner Violence: Not on file    Family History: No family history on file.  Medications:   Current Outpatient Medications on File Prior to Visit  Medication Sig Dispense Refill   acetaminophen (TYLENOL) 325 MG tablet Take 1-2 tablets (325-650 mg total) by mouth every 4 (four) hours as needed for mild pain. (Patient not taking: Reported on 11/09/2021)     B Complex Vitamins (VITAMIN B COMPLEX) TABS Take 1 tablet by mouth daily with breakfast.     carvedilol (COREG) 3.125 MG tablet Take 1 tablet (3.125 mg total) by mouth 2 (two) times daily with a meal. 60 tablet 0   Cholecalciferol (VITAMIN D3) 25 MCG (1000 UT) CAPS Take 1,000 Units by mouth daily.     Coenzyme Q10 (COQ-10 PO) Take 1 capsule by mouth daily.     Cyanocobalamin (VITAMIN B-12) 1000 MCG SUBL Place 1,000 mcg under the tongue daily.     ELIQUIS 5  MG TABS tablet Take 5 mg by mouth 2 (two) times daily.     ferrous sulfate 325 (65 FE) MG tablet Take 325 mg by mouth daily with breakfast.     furosemide (LASIX) 20 MG tablet Take 0.5 tablets (10 mg total) by mouth 2 (two) times daily. 60 tablet 0   LACTOBACILLUS ACID-PECTIN PO Take by mouth.     leptospermum manuka honey (MEDIHONEY) PSTE paste Apply 1 Application topically daily. (Patient not taking: Reported on 11/09/2021) 15 mL 0   levofloxacin (LEVAQUIN) 750 MG tablet Take 750 mg by mouth daily.     levothyroxine (SYNTHROID) 75 MCG tablet Take 75 mcg by mouth See admin instructions. Take 75 mcg by mouth before breakfast on Mon/Tues/Wed/Thurs/Fri     lisinopril (ZESTRIL) 2.5 MG tablet Take 2.5 mg by mouth daily.     pantoprazole (PROTONIX) 40 MG tablet Take 40 mg by mouth daily.     polyethylene glycol (MIRALAX / GLYCOLAX) 17 g packet Take 17 g by mouth daily. (Patient not taking: Reported on 11/09/2021) 14 each 0   Probiotic Product (PROBIOTIC-10 PO) Take by mouth.     senna-docusate (SENOKOT-S) 8.6-50 MG tablet Take 1 tablet by mouth 2 (two) times daily. (Patient not taking: Reported on 11/09/2021)     traZODone (DESYREL) 50 MG tablet Take 1 tablet (50 mg total) by mouth at bedtime. (Patient not taking: Reported on 11/09/2021) 30 tablet 0   No current facility-administered medications on file prior to visit.    Allergies:   Allergies  Allergen Reactions   Insulins Other (See Comments)    Familial adverse reaction to insulins; contraindicated per previous MD   Penicillins Hives, Swelling, Rash and Other (See Comments)    Peeling skin and caused the gums to peel/crack      OBJECTIVE:  Physical Exam  There were no vitals filed for this visit.  There is no height or weight on file to calculate BMI. No results found.   General: Frail very pleasant elderly Caucasian male, seated, in no evident distress Head: head normocephalic and atraumatic.   Neck: supple with no carotid or  supraclavicular bruits Cardiovascular: regular rate and rhythm, no murmurs Musculoskeletal: Decreased right shoulder ROM Skin:  no rash/petichiae Vascular:  Normal pulses all extremities   Neurologic Exam Mental Status: Awake and fully alert.  Mild dysarthria.  Unable to appreciate aphasia at today's visit.  Follows commands without difficulty.  Names objects without difficulty.  Oriented to place and time. Recent memory impaired and remote memory intact. Attention span, concentration and fund of knowledge appropriate during visit. Mood and affect appropriate.  Recall 1/3, with prompt 2/3. Good serial addition. 4 legged animal naming 10 in 60 seconds.  Cranial Nerves: Fundoscopic exam reveals sharp disc margins. Pupils equal, briskly reactive to light. Extraocular movements full without nystagmus. Visual fields full to confrontation. Hearing intact. Facial sensation intact.  Mild right facial weakness when smiling.  Tongue, palate moves normally and symmetrically.  Motor: Normal bulk and tone. Normal strength in all tested extremity muscles except slight RUE weakness with decreased right hand dexterity Sensory.: intact to touch , pinprick , position and vibratory sensation.  Coordination: Rapid alternating movements normal in all extremities except slightly decreased right hand. Finger-to-nose and heel-to-shin performed accurately bilaterally. Gait and Station: Arises from chair without difficulty. Stance is normal. Gait demonstrates normal stride length and balance with use of RW. Tandem walk and heel toe not attempted.  Reflexes: 1+ and symmetric. Toes downgoing.      NIHSS  2 Modified Rankin  2-3      ASSESSMENT: Seddrick Flax is a 81 y.o. year old male with left MCA strokes on 09/15/2021 in setting of left M1 occlusion s/p IR with TICI 3 reperfusion, embolic pattern secondary to A-fib and cardiomyopathy with low EF on Eliquis.  Possible TIA on 12/7 likely due to symptomatic right carotid  stenosis but not surgical candidate therefore medical therapy continued.  On 12/12, return to ED with right-sided weakness, right visual field deficit and confusion with evidence of acute left posterior limb of internal capsule and left medial temporal lobe infarcts.   Vascular risk factors include A-fib on Eliquis, CHF, HTN, HLD, DM, history of DVT/PE, cardiomyopathy with EF 20 to 25%, advanced age and AS s/p bioprosthetic aortic valve.  Recent hospitalization 7/12 -7/19 for sepsis in setting of aspiration pneumonia     PLAN:  Recurrent strokes:  Residual deficit: Mild RUE weakness, mild dysarthria, occasional aphasia and cognitive impairment.  Start PT/OT once cleared due to having issues with symptomatic hypotension.  Continue HH SLP with plans on pursuing barium swallow study - advised to call office if assistance is needed with ordering Continue  Eliquis 5 mg twice daily , aspirin 81 mg daily and *** for secondary stroke prevention managed by cardiology Discussed secondary stroke prevention measures and importance of close PCP follow up for aggressive stroke risk factor management including BP goal<130/90, and HLD with LDL goal<70  Stroke labs 09/2021: LDL 66, A1c 5.9 I have gone over the pathophysiology of stroke, warning signs and symptoms, risk factors and their management in some detail with instructions to go to the closest emergency room for symptoms of concern.  Right shoulder pain: Likely musculoskeletal, possibly post stroke as slight RUE weakness although lower suspicion as weakness more distal.  Discussed use of heat/ice and to follow-up with PCP for further evaluation    Follow up in 4 months or call earlier if needed   CC:  PCP: Susann Givens, PA-C    I spent 67 minutes of face-to-face and non-face-to-face time with patient and daughter.  This included previsit chart review, lab review, study review, electronic health record documentation, patient and daughter education  regarding above diagnoses and treatment plan and answered all the questions to patient satisfaction   Ihor Austin, [facility]  [facility] [address] [place], [place] [address]  Phone [phone] Fax [phone] Note: This document was  prepared with digital dictation and possible smart phrase technology. Any transcriptional errors that result from this process are unintentional.

## 2022-03-17 ENCOUNTER — Ambulatory Visit: Payer: Medicare HMO | Admitting: Adult Health

## 2022-03-29 ENCOUNTER — Inpatient Hospital Stay: Payer: Medicare HMO | Admitting: Neurology

## 2023-06-07 IMAGING — CR DG CHEST 2V
2 series · 2 of 2 positions shown · non-contrast
Comparison: 09/17/2021

CLINICAL DATA: Short of breath

EXAM:
CHEST - 2 VIEW

[chest lat]
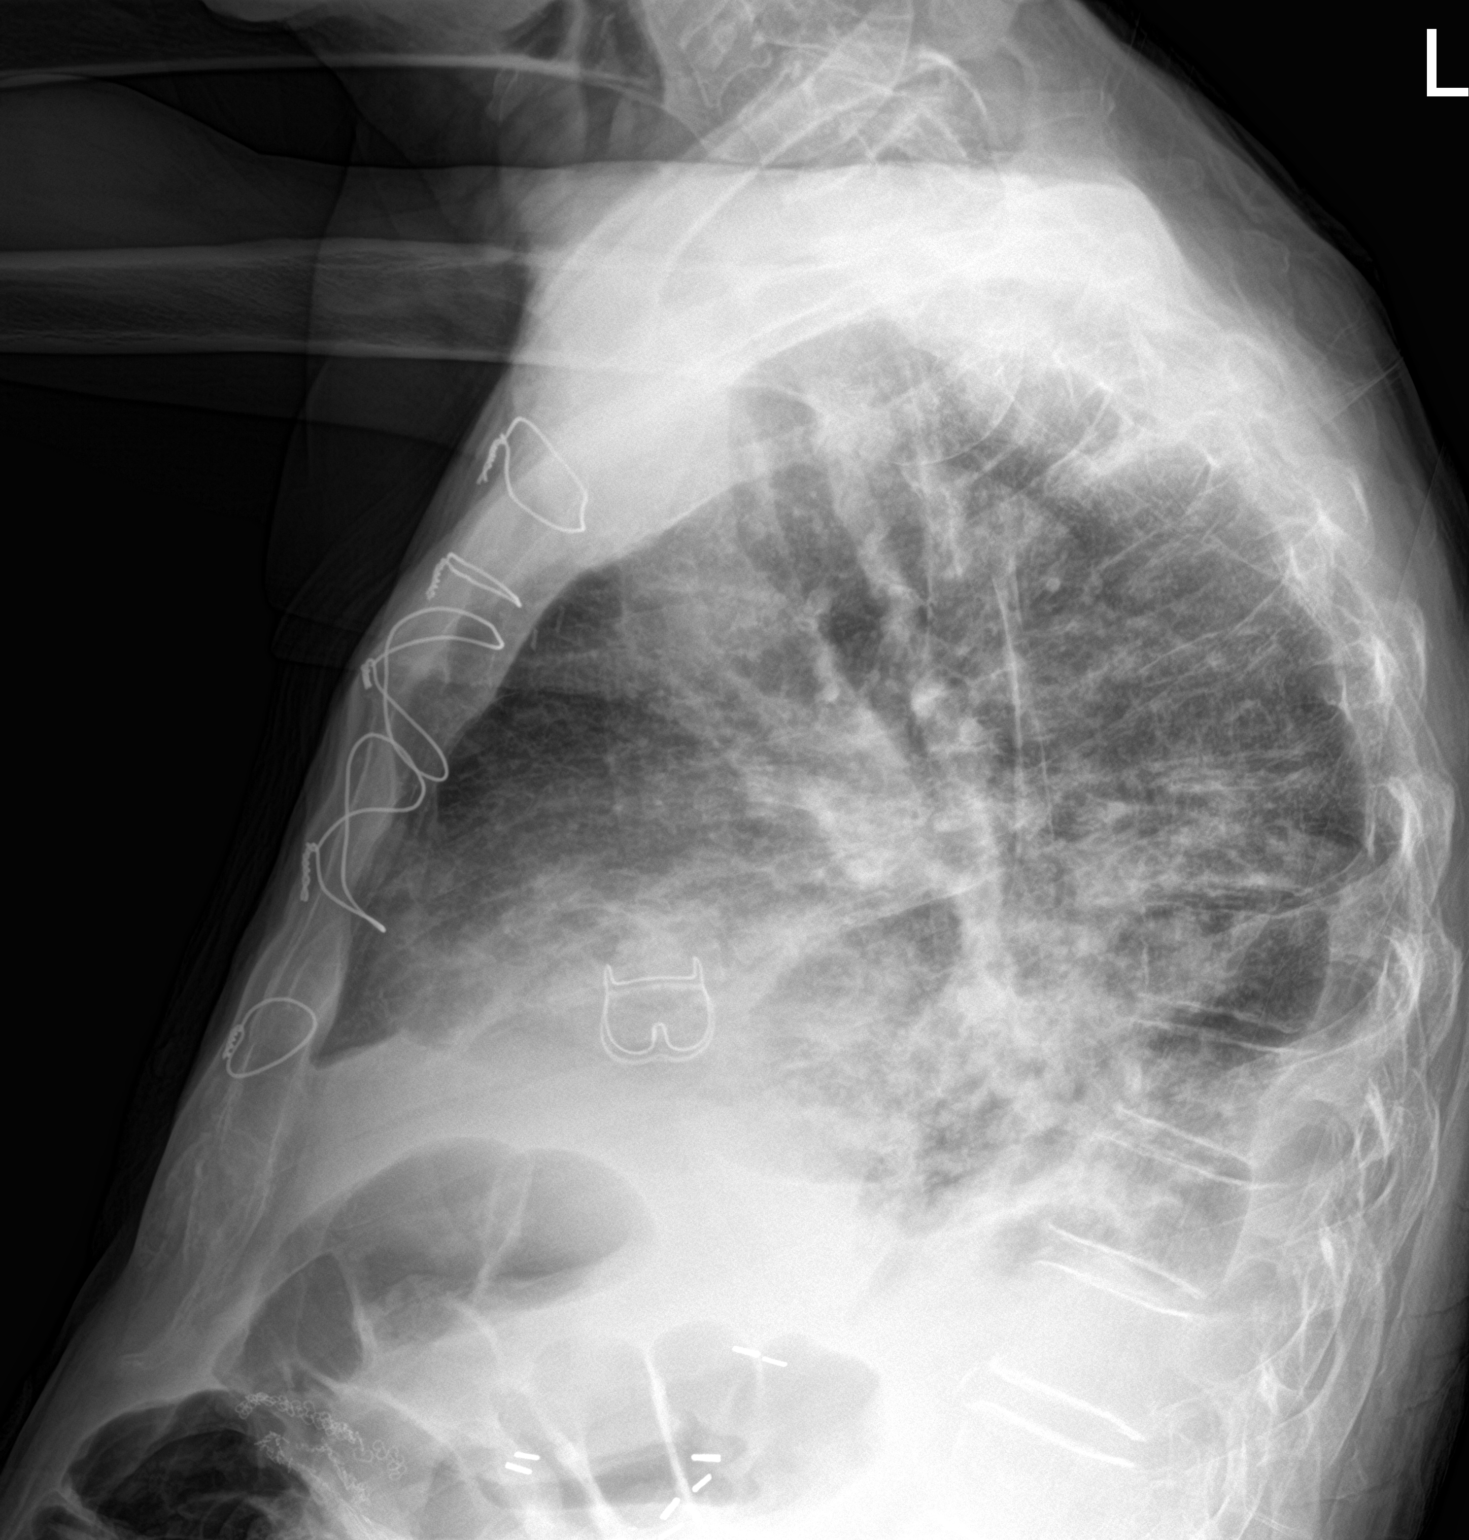

[chest ap]
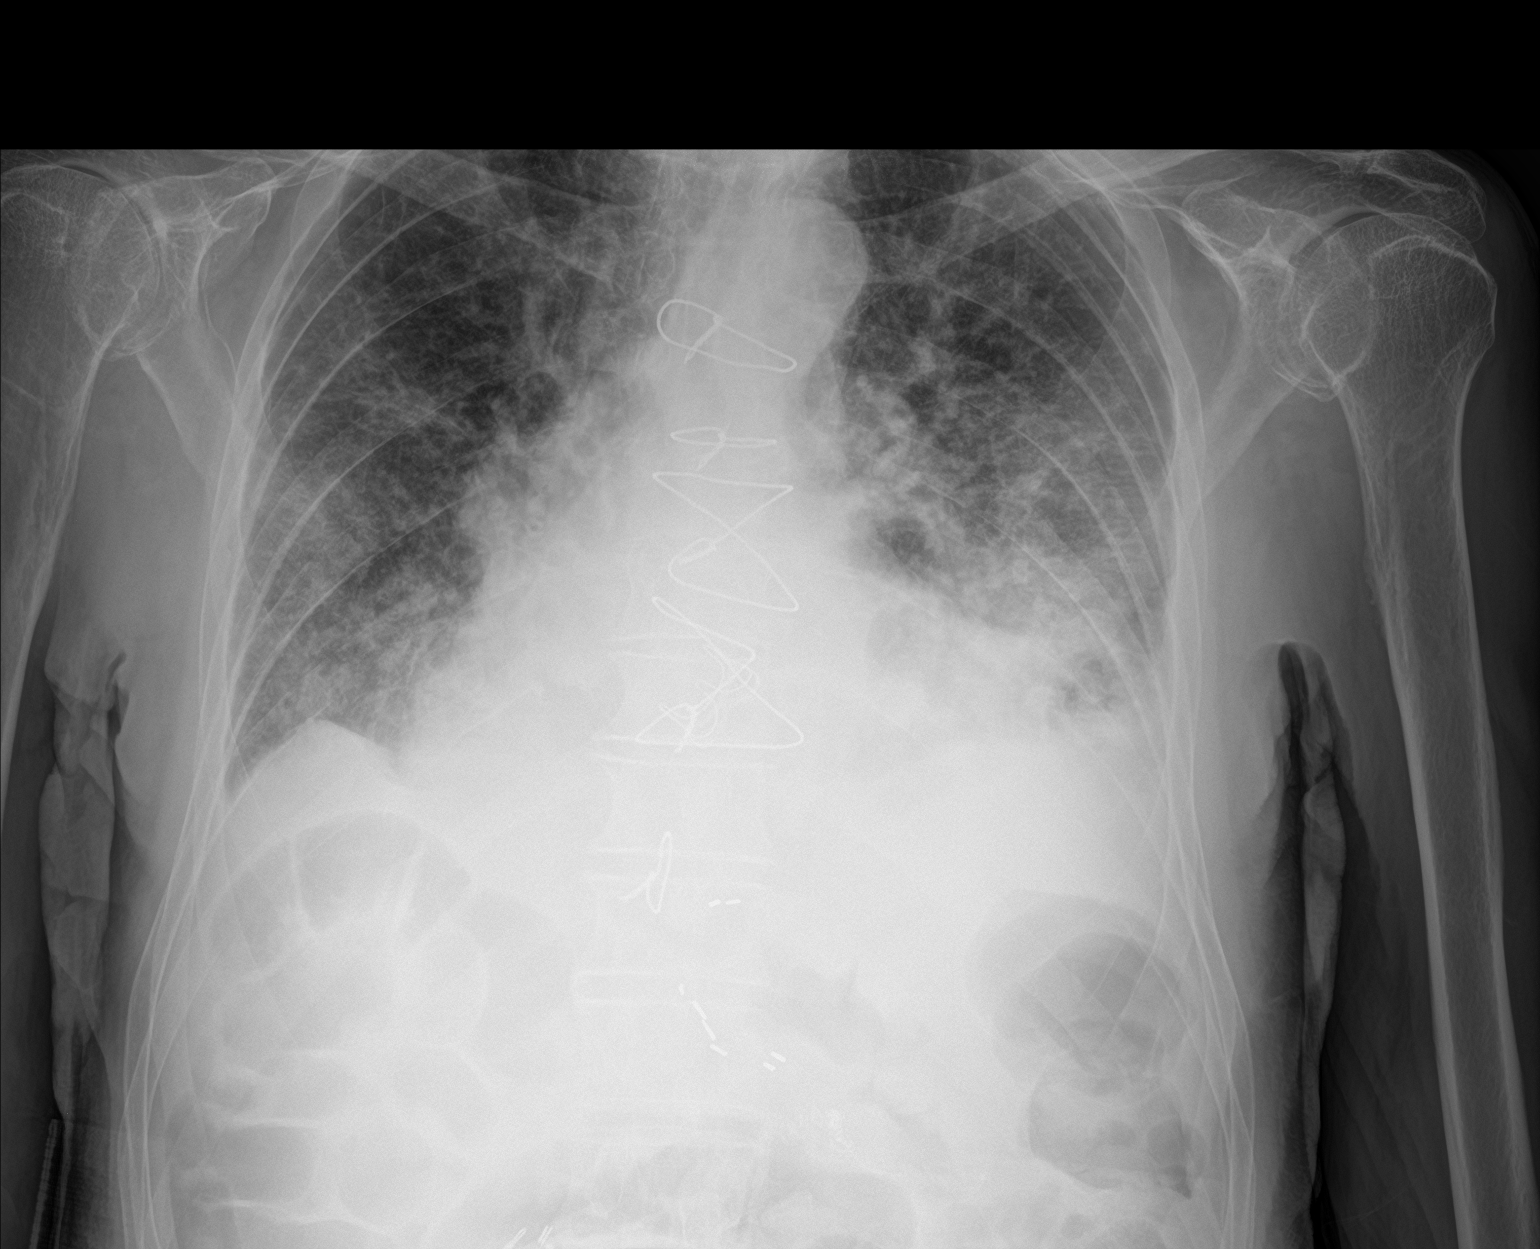

[2 of 2 positions shown; findings below may reference images not displayed]

FINDINGS: Endotracheal tube removed.  NG removed.

Progressive bilateral airspace disease, slightly asymmetric to the
left. No significant pleural effusion.
IMPRESSION: Interval development of bilateral airspace disease. This may
represent edema or pneumonia.

## 2023-11-03 DEATH — deceased
